# Patient Record
Sex: Female | Born: 1965 | Race: Black or African American | Hispanic: No | Marital: Married | Smoking: Former smoker
Health system: Southern US, Community
[De-identification: ages and names within clinical notes are randomized; demographics above are authoritative.]

## PROBLEM LIST (undated history)

## (undated) DIAGNOSIS — E785 Hyperlipidemia, unspecified: Secondary | ICD-10-CM

## (undated) DIAGNOSIS — Z720 Tobacco use: Secondary | ICD-10-CM

## (undated) DIAGNOSIS — E049 Nontoxic goiter, unspecified: Secondary | ICD-10-CM

## (undated) DIAGNOSIS — E559 Vitamin D deficiency, unspecified: Secondary | ICD-10-CM

## (undated) DIAGNOSIS — E669 Obesity, unspecified: Secondary | ICD-10-CM

## (undated) DIAGNOSIS — I10 Essential (primary) hypertension: Secondary | ICD-10-CM

## (undated) DIAGNOSIS — R0989 Other specified symptoms and signs involving the circulatory and respiratory systems: Secondary | ICD-10-CM

## (undated) DIAGNOSIS — K219 Gastro-esophageal reflux disease without esophagitis: Secondary | ICD-10-CM

## (undated) DIAGNOSIS — E119 Type 2 diabetes mellitus without complications: Secondary | ICD-10-CM

## (undated) DIAGNOSIS — Z973 Presence of spectacles and contact lenses: Secondary | ICD-10-CM

## (undated) DIAGNOSIS — K08109 Complete loss of teeth, unspecified cause, unspecified class: Secondary | ICD-10-CM

## (undated) HISTORY — DX: Obesity, unspecified: E66.9

## (undated) HISTORY — DX: Vitamin D deficiency, unspecified: E55.9

## (undated) HISTORY — PX: CHOLECYSTECTOMY: SHX55

## (undated) HISTORY — PX: STRABISMUS SURGERY: SHX218

## (undated) HISTORY — PX: COLPOSCOPY: SHX161

## (undated) HISTORY — DX: Gastro-esophageal reflux disease without esophagitis: K21.9

## (undated) HISTORY — DX: Presence of spectacles and contact lenses: Z97.3

## (undated) HISTORY — PX: ROTATOR CUFF REPAIR: SHX139

## (undated) HISTORY — DX: Other specified symptoms and signs involving the circulatory and respiratory systems: R09.89

## (undated) HISTORY — DX: Nontoxic goiter, unspecified: E04.9

## (undated) HISTORY — DX: Essential (primary) hypertension: I10

## (undated) HISTORY — DX: Tobacco use: Z72.0

## (undated) HISTORY — DX: Type 2 diabetes mellitus without complications: E11.9

## (undated) HISTORY — DX: Hyperlipidemia, unspecified: E78.5

---
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
## 2014-01-26 ENCOUNTER — Ambulatory Visit (INDEPENDENT_AMBULATORY_CARE_PROVIDER_SITE_OTHER): Payer: BLUE CROSS/BLUE SHIELD | Admitting: Medical

## 2014-01-26 ENCOUNTER — Encounter: Payer: Self-pay | Admitting: Medical

## 2014-01-26 VITALS — BP 128/88 | HR 72 | Temp 98.0°F | Resp 15 | Ht 62.5 in | Wt 196.0 lb

## 2014-01-26 DIAGNOSIS — Z8349 Family history of other endocrine, nutritional and metabolic diseases: Secondary | ICD-10-CM

## 2014-01-26 DIAGNOSIS — H6123 Impacted cerumen, bilateral: Secondary | ICD-10-CM

## 2014-01-26 DIAGNOSIS — E049 Nontoxic goiter, unspecified: Secondary | ICD-10-CM

## 2014-01-26 DIAGNOSIS — H0520 Unspecified exophthalmos: Secondary | ICD-10-CM

## 2014-01-26 DIAGNOSIS — I10 Essential (primary) hypertension: Secondary | ICD-10-CM

## 2014-01-26 MED ORDER — ASPIRIN EC 81 MG PO TBEC: 81.0000 mg | DELAYED_RELEASE_TABLET | Freq: Every day | ORAL | Status: DC

## 2014-01-26 MED ORDER — AMLODIPINE BESYLATE 5 MG PO TABS: 5.0000 mg | ORAL_TABLET | Freq: Every day | ORAL | Status: DC

## 2014-01-26 MED ORDER — HYDROCHLOROTHIAZIDE 25 MG PO TABS: 25.0000 mg | ORAL_TABLET | Freq: Every day | ORAL | Status: DC

## 2014-01-26 MED ORDER — POTASSIUM CHLORIDE ER 10 MEQ PO TBCR: 10.0000 meq | EXTENDED_RELEASE_TABLET | Freq: Every day | ORAL | Status: DC

## 2014-01-26 MED ORDER — ENALAPRIL MALEATE 10 MG PO TABS: 10.0000 mg | ORAL_TABLET | Freq: Every day | ORAL | Status: DC

## 2014-01-26 NOTE — Progress Notes (Signed)
   Subjective:   Ariel Sweeney is a 49 y.o. female presenting on 01/26/2014 with Hypertension  Here as a new patient today. From [place] originally but has spent the last 10 years in [place]. Recently moved back here [place] living with family since July of this past year.  In general her only complaints are need for medication refills on blood pressure medication and impacted ear wax. She does fine on her current blood pressure medications, been on those medications for years. Hearing seems muffled and in the past has had to have the earwax flushed out several times.  Last physical over a year ago. No other aggravating or relieving factors.  No other complaint.  Review of Systems ROS as in subjective      Objective:    Filed Vitals:   01/26/14 1458  BP: 128/88  Pulse: 72  Temp: 98 F (36.7 C)  Resp: 15    General appearance: alert, no distress, WD/WN, pleasant AA female +exophthalmos noted Impacted cerumen bilat Oral cavity: MMM, no lesions Neck: supple, no lymphadenopathy, generalized mild to moderate goiter noted, no masses Heart: RRR, normal S1, S2, no murmurs Lungs: CTA bilaterally, no wheezes, rhonchi, or rales Abdomen: +bs, soft, non tender, non distended, no masses, no hepatomegaly, no splenomegaly Pulses: 2+ symmetric, upper and lower extremities, normal cap refill Ext: no edema     Assessment: Encounter Diagnoses  Name Primary?  . Essential hypertension Yes  . Goiter   . Exophthalmos   . Impacted cerumen of both ears   . Family history of thyroid disease      Plan: Hypertension-continue same medication, she has been well controlled on current medication, return in 3 months for a fasting physical and labs  Goiter, exophthalmos, family history of thyroid disease in mother-we discussed the findings and diagnosis of goiter which she was unaware of. She will return in 3 months for a physical and labs and advised that she will need an ultrasound of  the thyroid as well.  Impacted cerumen of both ears - discussed findings.  Discussed risk/benefits of procedure and patient agrees to procedure. Successfully used warm water lavage to remove impacted cerumen from bilat ear canal. Patient tolerated procedure well. Advised they avoid using any cotton swabs or other devices to clean the ear canals.  Use basic hygiene as discussed.  Follow up prn.   Azarria was seen today for hypertension.  Diagnoses and associated orders for this visit:  Essential hypertension  Goiter  Exophthalmos  Impacted cerumen of both ears  Family history of thyroid disease  Other Orders - potassium chloride (K-DUR) 10 MEQ tablet; Take 1 tablet (10 mEq total) by mouth daily. - hydrochlorothiazide (HYDRODIURIL) 25 MG tablet; Take 1 tablet (25 mg total) by mouth daily. - enalapril (VASOTEC) 10 MG tablet; Take 1 tablet (10 mg total) by mouth daily. - amLODipine (NORVASC) 5 MG tablet; Take 1 tablet (5 mg total) by mouth daily. - aspirin EC 81 MG tablet; Take 1 tablet (81 mg total) by mouth daily.    Return 64mo for CPX.

## 2014-04-17 ENCOUNTER — Telehealth: Payer: Self-pay | Admitting: Medical

## 2014-04-17 ENCOUNTER — Other Ambulatory Visit (HOSPITAL_COMMUNITY)
Admission: RE | Admit: 2014-04-17 | Discharge: 2014-04-17 | Disposition: A | Discharge status: Home / Self Care | Payer: BLUE CROSS/BLUE SHIELD | Source: Ambulatory Visit | Attending: Medical | Admitting: Medical

## 2014-04-17 ENCOUNTER — Ambulatory Visit (INDEPENDENT_AMBULATORY_CARE_PROVIDER_SITE_OTHER): Payer: BLUE CROSS/BLUE SHIELD | Admitting: Medical

## 2014-04-17 ENCOUNTER — Encounter: Payer: Self-pay | Admitting: Medical

## 2014-04-17 VITALS — BP 122/88 | HR 76 | Temp 98.1°F | Ht 61.5 in | Wt 199.0 lb

## 2014-04-17 DIAGNOSIS — I10 Essential (primary) hypertension: Secondary | ICD-10-CM

## 2014-04-17 DIAGNOSIS — Z1239 Encounter for other screening for malignant neoplasm of breast: Secondary | ICD-10-CM | POA: Diagnosis not present

## 2014-04-17 DIAGNOSIS — Z7189 Other specified counseling: Secondary | ICD-10-CM | POA: Diagnosis not present

## 2014-04-17 DIAGNOSIS — E669 Obesity, unspecified: Secondary | ICD-10-CM | POA: Diagnosis not present

## 2014-04-17 DIAGNOSIS — H0520 Unspecified exophthalmos: Secondary | ICD-10-CM | POA: Diagnosis not present

## 2014-04-17 DIAGNOSIS — Z01419 Encounter for gynecological examination (general) (routine) without abnormal findings: Secondary | ICD-10-CM | POA: Insufficient documentation

## 2014-04-17 DIAGNOSIS — Z7185 Encounter for immunization safety counseling: Secondary | ICD-10-CM

## 2014-04-17 DIAGNOSIS — Z1151 Encounter for screening for human papillomavirus (HPV): Secondary | ICD-10-CM | POA: Insufficient documentation

## 2014-04-17 DIAGNOSIS — E049 Nontoxic goiter, unspecified: Secondary | ICD-10-CM | POA: Insufficient documentation

## 2014-04-17 DIAGNOSIS — Z23 Encounter for immunization: Secondary | ICD-10-CM

## 2014-04-17 DIAGNOSIS — Z124 Encounter for screening for malignant neoplasm of cervix: Secondary | ICD-10-CM

## 2014-04-17 DIAGNOSIS — Z0000 Encounter for general adult medical examination without abnormal findings: Secondary | ICD-10-CM | POA: Diagnosis not present

## 2014-04-17 LAB — POCT URINALYSIS DIPSTICK
Bilirubin, UA: NEGATIVE
Glucose, UA: NEGATIVE
Ketones, UA: NEGATIVE
Leukocytes, UA: NEGATIVE
Nitrite, UA: NEGATIVE
Spec Grav, UA: >=1.03
Urobilinogen, UA: NEGATIVE
pH, UA: 6

## 2014-04-17 LAB — CBC
HCT: 41.2 % (ref 36.0–46.0)
Hemoglobin: 13.6 g/dL (ref 12.0–15.0)
MCH: 29.4 pg (ref 26.0–34.0)
MCHC: 33 g/dL (ref 30.0–36.0)
MCV: 89 fL (ref 78.0–100.0)
MPV: 10.3 fL (ref 8.6–12.4)
Platelets: 283 10*3/uL (ref 150–400)
RBC: 4.63 MIL/uL (ref 3.87–5.11)
RDW: 13.4 % (ref 11.5–15.5)
WBC: 5.8 10*3/uL (ref 4.0–10.5)

## 2014-04-17 NOTE — Progress Notes (Addendum)
Subjective:   HPI  Ariel Sweeney Pain is a 49 y.o. female who presents for a complete physical.  Preventative care: Last ophthalmology visit:N/A Last dental visit:N/A Last colonoscopy:N/A Last mammogram:3 YEARS AGO Last gynecological exam:04/17/14 Last EKG:YES IN THE PAST Last labs:NEW Prior vaccinations: TD or Tdap:SHE FEELS LIKE ITS BEEN WITH THE PAST TEN YEARS Influenza:DECLINED Pneumococcal:N/A Shingles/Zostavax:N/A  Concerns: Gynecology history - hx/o 4 pregnancy, 1 live birth (37yo daughter), 2 miscarriage, 1 abortion.   Having periods, but been a little variable.   Last year January and February no period, then had periods until June, then skipped a few months.   So periods are starting to slow down a bit.   Mother had partial hysterectomy.  Currently not sexually active, declines STD screening.  Last std screen last year, normal, no sexual partners since.  Reviewed their medical, surgical, family, social, medication, and allergy history and updated chart as appropriate.  Past Medical History  Diagnosis Date  . Hypertension   . Obesity   . Goiter   . GERD (gastroesophageal reflux disease)     intermittent  . Wears contact lenses     Past Surgical History  Procedure Laterality Date  . Strabismus surgery      childhood  . Cholecystectomy    . Colposcopy      history of abnormal pap in remote past    History   Social History  . Marital Status: Single    Spouse Name: N/A  . Number of Children: N/A  . Years of Education: N/A   Occupational History  . Not on file.   Social History Main Topics  . Smoking status: Current Some Day Smoker -- 0.25 packs/day for 25 years  . Smokeless tobacco: Not on file  . Alcohol Use: 3.6 oz/week    2 Glasses of wine, 2 Cans of beer, 2 Shots of liquor, 0 Standard drinks or equivalent per week  . Drug Use: No  . Sexual Activity: Not on file   Other Topics Concern  . 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 Diabetes Mother   . Hypertension Mother   . Kidney disease Mother   . Anemia Mother   . Hypertension Father   . Diabetes Father   . Heart disease Father   . Hypertension Sister   . Stroke Maternal Aunt   . Cancer Neg Hx      Current outpatient prescriptions:  .  amLODipine (NORVASC) 5 MG tablet, Take 1 tablet (5 mg total) by mouth daily., Disp: 30 tablet, Rfl: 2 .  aspirin EC 81 MG tablet, Take 1 tablet (81 mg total) by mouth daily., Disp: 30 tablet, Rfl: 11 .  cholecalciferol (VITAMIN D) 1000 UNITS tablet, Take 1,000 Units by mouth daily., Disp: , Rfl:  .  cyanocobalamin 500 MCG tablet, Take 500 mcg by mouth every other day., Disp: , Rfl:  .  enalapril (VASOTEC) 10 MG tablet, Take 1 tablet (10 mg total) by mouth daily., Disp: 30 tablet, Rfl: 2 .  hydrochlorothiazide (HYDRODIURIL) 25 MG tablet, Take 1 tablet (25 mg total) by mouth daily., Disp: 30 tablet, Rfl: 2 .  potassium chloride (K-DUR) 10 MEQ tablet, Take 1 tablet (10 mEq total) by mouth daily., Disp: 30 tablet, Rfl: 2  No  Known Allergies  Review of Systems Constitutional: -fever, -chills, -sweats, -unexpected weight change, -decreased appetite, -fatigue Allergy: -sneezing, -itching, -congestion Dermatology: -changing moles, --rash, -lumps ENT: -runny nose, -ear pain, -sore throat, -hoarseness, -sinus pain, -teeth pain, - ringing in ears, -hearing loss, -nosebleeds Cardiology: -chest pain, -palpitations, -swelling, -difficulty breathing when lying flat, -waking up short of breath Respiratory: -cough, -shortness of breath, -difficulty breathing with exercise or exertion, -wheezing, -coughing up blood Gastroenterology: -abdominal pain, -nausea, -vomiting, -diarrhea, -constipation, -blood in stool, -changes in bowel movement, -difficulty swallowing or  eating Hematology: -bleeding, -bruising  Musculoskeletal: -joint aches, -muscle aches, -joint swelling, -back pain, -neck pain, -cramping, -changes in gait Ophthalmology: denies vision changes, eye redness, itching, discharge Urology: -burning with urination, -difficulty urinating, -blood in urine, -urinary frequency, -urgency, -incontinence Neurology: -headache, -weakness, -tingling, -numbness, -memory loss, -falls, -dizziness Psychology: -depressed mood, -agitation, -sleep problems     Objective:   Physical Exam  BP 122/88 mmHg  Pulse 76  Temp(Src) 98.1 F (36.7 C) (Oral)  Ht 5' 1.5" (1.562 m)  Wt 199 lb (90.266 kg)  BMI 37.00 kg/m2  General appearance: alert, no distress, WD/WN, obese AA female Skin: several keloids left and right upper shoulders posteriorly HEENT: normocephalic, conjunctiva/corneas normal, sclerae anicteric, PERRLA, EOMi, nares patent, no discharge or erythema, pharynx normal Oral cavity: MMM, tongue normal, teeth with moderate plaque Neck: supple, no lymphadenopathy, moderate goiter, no masses, normal ROM, no bruits Chest: 2 vertical linear keloids, upper middle chest, non tender, normal shape and expansion Heart: RRR, normal S1, S2, no murmurs Lungs: CTA bilaterally, no wheezes, rhonchi, or rales Abdomen: +bs, soft, few small laparoscopic surgical scars with keloid tissue upper abdomen and umbilicus, non tender, non distended, no masses, +possible mild hepatomegaly, no splenomegaly, no bruits Back: non tender, normal ROM, no scoliosis Musculoskeletal: upper extremities non tender, no obvious deformity, normal ROM throughout, lower extremities non tender, no obvious deformity, normal ROM throughout Extremities: no edema, no cyanosis, no clubbing Pulses: 2+ symmetric, upper and lower extremities, normal cap refill Neurological: alert, oriented x 3, CN2-12 intact, strength normal upper extremities and lower extremities, sensation normal throughout, DTRs 2+  throughout, no cerebellar signs, gait normal Psychiatric: normal affect, behavior normal, pleasant  Breast: nontender, no masses or lumps, no skin changes, no nipple discharge or inversion, no axillary lymphadenopathy Gyn: Normal external genitalia without lesions, small skin tag right lower vulva, vagina with normal mucosa, cervix without lesions, no cervical motion tenderness, no abnormal vaginal discharge.  Uterus and adnexa not enlarged, nontender, no masses.  Pap performed.  Exam chaperoned by nurse. Rectal: small external hemorrhoid, otherwise normal appearing    Assessment and Plan :    Encounter Diagnoses  Name Primary?  . Encounter for health maintenance examination in adult Yes  . Essential hypertension   . Goiter   . Obesity   . Exophthalmos   . Screening for breast cancer   . Screening for cervical cancer   . Need for pneumococcal vaccine   . Vaccine counseling      Physical exam - discussed healthy lifestyle, diet, exercise, preventative care, vaccinations, and addressed their concerns.  Handout given. See your eye doctor yearly for routine vision care. See your dentist yearly for routine dental care including hygiene visits twice yearly. HTN - c/t same medication Goiter - [place] and labs ordered Routine labs today obesity - work on efforts at weight loss through lifestyle changes as discussed Counseled on the pneumococcal vaccine.  Vaccine information sheet given.  Pneumococcal vaccine PPSV 23  given after consent obtained. Vaccine counseling - she will check into free Hep B vaccine at work. Declines flu vaccine.  Follow-up pending labs

## 2014-04-17 NOTE — Patient Instructions (Signed)
  Thank you for giving me the opportunity to serve you today.    Your diagnosis today includes: Encounter Diagnoses  Name Primary?  . Encounter for health maintenance examination in adult Yes  . Essential hypertension   . Goiter   . Obesity   . Exophthalmos   . Screening for breast cancer   . Screening for cervical cancer   . Need for pneumococcal vaccine   . Vaccine counseling      Specific recommendations today include:  We will set up for ultrasound of thyroid  We will call with labs See your eye doctor yearly for routine vision care. See your dentist yearly for routine dental care including hygiene visits twice yearly. I strongly recommend you get the hepatitis B vaccine series through work  Return pending labs.    I have included other useful information below for your review.   [facility] Dr. Webb Laws [place], [place], [place] [address] [phone]   [facility] Dr. Camillo Flaming [address], [place], [place] [address]  [email or web address]   Fabio Pierce, M.D. Corena Herter, O.D. [place], [place], [place] [address] Medical telephone: [phone] Optical telephone: [phone]   Dentist Dr. Jonna Coup, dentist [address] [place], [place] [address] [phone] [email or web address]

## 2014-04-17 NOTE — Telephone Encounter (Signed)
Set up for thyroid ultrasound given goiter, exopthalmos

## 2014-04-18 ENCOUNTER — Other Ambulatory Visit: Payer: Self-pay | Admitting: Medical

## 2014-04-18 LAB — LIPID PANEL
Cholesterol: 188 mg/dL (ref 0–200)
HDL: 37 mg/dL — ABNORMAL LOW (ref >=46)
LDL Cholesterol: 106 mg/dL — ABNORMAL HIGH (ref 0–99)
Total CHOL/HDL Ratio: 5.1 Ratio
Triglycerides: 224 mg/dL — ABNORMAL HIGH (ref <150)
VLDL: 45 mg/dL — ABNORMAL HIGH (ref 0–40)

## 2014-04-18 LAB — T4, FREE: Free T4: 0.88 ng/dL (ref 0.80–1.80)

## 2014-04-18 LAB — HEMOGLOBIN A1C
Hgb A1c MFr Bld: 6.5 % — ABNORMAL HIGH (ref <5.7)
Mean Plasma Glucose: 140 mg/dL — ABNORMAL HIGH (ref <117)

## 2014-04-18 LAB — TSH: TSH: 0.855 u[IU]/mL (ref 0.350–4.500)

## 2014-04-18 LAB — CYTOLOGY - PAP

## 2014-04-18 MED ORDER — POTASSIUM CHLORIDE ER 10 MEQ PO TBCR: 10.0000 meq | EXTENDED_RELEASE_TABLET | Freq: Every day | ORAL | Status: DC

## 2014-04-18 MED ORDER — HYDROCHLOROTHIAZIDE 25 MG PO TABS: 25.0000 mg | ORAL_TABLET | Freq: Every day | ORAL | Status: DC

## 2014-04-18 MED ORDER — AMLODIPINE BESYLATE 5 MG PO TABS: 5.0000 mg | ORAL_TABLET | Freq: Every day | ORAL | Status: DC

## 2014-04-18 MED ORDER — ENALAPRIL MALEATE 10 MG PO TABS: 10.0000 mg | ORAL_TABLET | Freq: Every day | ORAL | Status: DC

## 2014-04-18 NOTE — Telephone Encounter (Signed)
Patient is aware of her appointment to have her ultrasound on 04/21/14 @ 140 pm [place].,

## 2014-04-21 ENCOUNTER — Ambulatory Visit
Admission: RE | Admit: 2014-04-21 | Discharge: 2014-04-21 | Disposition: A | Discharge status: Home / Self Care | Payer: BLUE CROSS/BLUE SHIELD | Source: Ambulatory Visit | Attending: Medical | Admitting: Medical

## 2014-04-21 DIAGNOSIS — E049 Nontoxic goiter, unspecified: Secondary | ICD-10-CM

## 2014-04-21 DIAGNOSIS — H0520 Unspecified exophthalmos: Secondary | ICD-10-CM

## 2014-05-02 ENCOUNTER — Encounter: Payer: Self-pay | Admitting: Medical

## 2014-05-02 ENCOUNTER — Ambulatory Visit (INDEPENDENT_AMBULATORY_CARE_PROVIDER_SITE_OTHER): Payer: BLUE CROSS/BLUE SHIELD | Admitting: Medical

## 2014-05-02 VITALS — BP 120/82 | HR 70 | Resp 14 | Wt 192.0 lb

## 2014-05-02 DIAGNOSIS — I10 Essential (primary) hypertension: Secondary | ICD-10-CM

## 2014-05-02 DIAGNOSIS — R946 Abnormal results of thyroid function studies: Secondary | ICD-10-CM | POA: Diagnosis not present

## 2014-05-02 DIAGNOSIS — E669 Obesity, unspecified: Secondary | ICD-10-CM

## 2014-05-02 DIAGNOSIS — R7989 Other specified abnormal findings of blood chemistry: Secondary | ICD-10-CM

## 2014-05-02 DIAGNOSIS — E041 Nontoxic single thyroid nodule: Secondary | ICD-10-CM

## 2014-05-02 DIAGNOSIS — E782 Mixed hyperlipidemia: Secondary | ICD-10-CM | POA: Diagnosis not present

## 2014-05-02 DIAGNOSIS — R7301 Impaired fasting glucose: Secondary | ICD-10-CM

## 2014-05-02 NOTE — Progress Notes (Signed)
Subjective Here for f/u on recent abnormal labs and ultrasound of thyroid.   She reports not much exercise, and eating worse here then when she lived up in [place] prior.  Eating much more fried foods and fast food now.   Drinks soda at least once daily.  Eye doctor in the past has not worried about thyroid issues with her.  No other aggravating or relieving factors. No other complaint.  Past Medical History  Diagnosis Date  . Hypertension   . Obesity   . Goiter   . GERD (gastroesophageal reflux disease)     intermittent  . Wears contact lenses    ROS as in subjective  Objective: BP 120/82 mmHg  Pulse 70  Resp 14  Wt 192 lb (87.091 kg)  Gen: wd, wn, nad Psych: pleasant, good eye contact otherwise not examined  Assessment: Encounter Diagnoses  Name Primary?  . Impaired fasting blood sugar Yes  . Mixed dyslipidemia   . Obesity   . Essential hypertension   . Thyroid cyst   . Abnormal thyroid blood test    Plan:  We discussed her recent findings on labs and thyroid US including HgbA 6.5%, elevated TRIG, low HDL, cholesterol ration higher than average, low to normal TSH and Free T4, thyroid cysts on ultrasound, exopthalamus and goiter.   She is compliant with BP medication.   The major take away today was the need for lifestyle changes.  counseled extensively on diabetic diet, exercise, water intake, cutting out soda, cutting down on serving sizes, and making significant changes to lose weight as well as improving glucose and dyslipidemia.   Will recheck 4-56mo, sooner if needed.

## 2014-06-08 ENCOUNTER — Encounter: Payer: Self-pay | Admitting: Medical

## 2014-06-08 ENCOUNTER — Ambulatory Visit (INDEPENDENT_AMBULATORY_CARE_PROVIDER_SITE_OTHER): Payer: BLUE CROSS/BLUE SHIELD | Admitting: Medical

## 2014-06-08 VITALS — BP 132/78 | HR 82 | Temp 98.2°F | Resp 15 | Wt 189.0 lb

## 2014-06-08 DIAGNOSIS — H9193 Unspecified hearing loss, bilateral: Secondary | ICD-10-CM | POA: Diagnosis not present

## 2014-06-08 DIAGNOSIS — H6123 Impacted cerumen, bilateral: Secondary | ICD-10-CM

## 2014-06-08 NOTE — Progress Notes (Signed)
subjective Here for hearing decreased, thinks her ear wax is impacted.  Has had long history of this.   She denies URI symptoms, no other c/o.     Objective: BP 132/78 mmHg  Pulse 82  Temp(Src) 98.2 F (36.8 C) (Oral)  Resp 15  Wt 189 lb (85.73 kg)   Gen: wd, wn nad bilat ear canals with impacted cerumen otherwise HENT unremarkable   Assessment: Encounter Diagnoses  Name Primary?  . Hearing decreased, bilateral Yes  . Impacted cerumen of both ears    Plan: Discussed findings.  Discussed risk/benefits of procedure and patient agrees to procedure. Successfully used warm water lavage to remove impacted cerumen from bilat ear canal. Patient tolerated procedure well. Advised they avoid using any cotton swabs or other devices to clean the ear canals.  Use basic hygiene as discussed.  Follow up prn.

## 2015-01-14 HISTORY — PX: BREAST CYST ASPIRATION: SHX578

## 2015-04-28 ENCOUNTER — Other Ambulatory Visit: Payer: Self-pay | Admitting: Medical

## 2015-05-01 ENCOUNTER — Other Ambulatory Visit: Payer: Self-pay | Admitting: Family Medicine

## 2015-05-01 ENCOUNTER — Telehealth: Payer: Self-pay | Admitting: Family Medicine

## 2015-05-01 MED ORDER — AMLODIPINE BESYLATE 5 MG PO TABS: 5.0000 mg | ORAL_TABLET | Freq: Every day | ORAL | Status: DC

## 2015-05-01 NOTE — Telephone Encounter (Signed)
Pt called for appt and refills.  Refills were given yesterday and pt made cpe for May

## 2015-05-22 ENCOUNTER — Encounter: Payer: BLUE CROSS/BLUE SHIELD | Admitting: Medical

## 2015-06-06 ENCOUNTER — Encounter: Payer: Self-pay | Admitting: Medical

## 2015-06-06 ENCOUNTER — Ambulatory Visit (INDEPENDENT_AMBULATORY_CARE_PROVIDER_SITE_OTHER): Payer: BLUE CROSS/BLUE SHIELD | Admitting: Medical

## 2015-06-06 VITALS — BP 118/80 | HR 72 | Ht 61.5 in | Wt 184.0 lb

## 2015-06-06 DIAGNOSIS — M25511 Pain in right shoulder: Secondary | ICD-10-CM | POA: Diagnosis not present

## 2015-06-06 DIAGNOSIS — Z0000 Encounter for general adult medical examination without abnormal findings: Secondary | ICD-10-CM | POA: Insufficient documentation

## 2015-06-06 DIAGNOSIS — Z23 Encounter for immunization: Secondary | ICD-10-CM

## 2015-06-06 DIAGNOSIS — Z1211 Encounter for screening for malignant neoplasm of colon: Secondary | ICD-10-CM | POA: Diagnosis not present

## 2015-06-06 DIAGNOSIS — Z1239 Encounter for other screening for malignant neoplasm of breast: Secondary | ICD-10-CM | POA: Insufficient documentation

## 2015-06-06 DIAGNOSIS — E669 Obesity, unspecified: Secondary | ICD-10-CM | POA: Diagnosis not present

## 2015-06-06 DIAGNOSIS — Z1231 Encounter for screening mammogram for malignant neoplasm of breast: Secondary | ICD-10-CM

## 2015-06-06 DIAGNOSIS — E049 Nontoxic goiter, unspecified: Secondary | ICD-10-CM | POA: Diagnosis not present

## 2015-06-06 DIAGNOSIS — F17200 Nicotine dependence, unspecified, uncomplicated: Secondary | ICD-10-CM | POA: Insufficient documentation

## 2015-06-06 DIAGNOSIS — H6123 Impacted cerumen, bilateral: Secondary | ICD-10-CM | POA: Diagnosis not present

## 2015-06-06 DIAGNOSIS — R7301 Impaired fasting glucose: Secondary | ICD-10-CM | POA: Insufficient documentation

## 2015-06-06 DIAGNOSIS — H0520 Unspecified exophthalmos: Secondary | ICD-10-CM

## 2015-06-06 DIAGNOSIS — I10 Essential (primary) hypertension: Secondary | ICD-10-CM | POA: Diagnosis not present

## 2015-06-06 LAB — LIPID PANEL
Cholesterol: 179 mg/dL (ref 125–200)
HDL: 46 mg/dL (ref >=46)
LDL Cholesterol: 90 mg/dL (ref <130)
Total CHOL/HDL Ratio: 3.9 Ratio (ref <=5.0)
Triglycerides: 214 mg/dL — ABNORMAL HIGH (ref <150)
VLDL: 43 mg/dL — ABNORMAL HIGH (ref <30)

## 2015-06-06 LAB — HEMOGLOBIN A1C
Hgb A1c MFr Bld: 5.8 % — ABNORMAL HIGH (ref <5.7)
Mean Plasma Glucose: 120 mg/dL

## 2015-06-06 LAB — CBC
HCT: 41.1 % (ref 35.0–45.0)
Hemoglobin: 13.8 g/dL (ref 11.7–15.5)
MCH: 29.6 pg (ref 27.0–33.0)
MCHC: 33.6 g/dL (ref 32.0–36.0)
MCV: 88.2 fL (ref 80.0–100.0)
MPV: 9.8 fL (ref 7.5–12.5)
Platelets: 248 10*3/uL (ref 140–400)
RBC: 4.66 MIL/uL (ref 3.80–5.10)
RDW: 13.6 % (ref 11.0–15.0)
WBC: 5.4 10*3/uL (ref 4.0–10.5)

## 2015-06-06 LAB — COMPREHENSIVE METABOLIC PANEL
ALT: 25 U/L (ref 6–29)
AST: 24 U/L (ref 10–35)
Albumin: 4.1 g/dL (ref 3.6–5.1)
Alkaline Phosphatase: 49 U/L (ref 33–115)
BUN: 16 mg/dL (ref 7–25)
CO2: 24 mmol/L (ref 20–31)
Calcium: 9.3 mg/dL (ref 8.6–10.2)
Chloride: 102 mmol/L (ref 98–110)
Creat: 0.93 mg/dL (ref 0.50–1.10)
Glucose, Bld: 98 mg/dL (ref 65–99)
Potassium: 3.6 mmol/L (ref 3.5–5.3)
Sodium: 138 mmol/L (ref 135–146)
Total Bilirubin: 0.4 mg/dL (ref 0.2–1.2)
Total Protein: 7.2 g/dL (ref 6.1–8.1)

## 2015-06-06 LAB — POCT URINALYSIS DIPSTICK
Bilirubin, UA: NEGATIVE
Glucose, UA: NEGATIVE
Ketones, UA: NEGATIVE
Leukocytes, UA: NEGATIVE
Nitrite, UA: NEGATIVE
Protein, UA: NEGATIVE
Spec Grav, UA: >=1.03
Urobilinogen, UA: NEGATIVE
pH, UA: 6

## 2015-06-06 LAB — T4, FREE: Free T4: 1.3 ng/dL (ref 0.8–1.8)

## 2015-06-06 LAB — TSH: TSH: 0.98 mIU/L

## 2015-06-06 NOTE — Addendum Note (Signed)
Addended by: Billie Lade on: 06/06/2015 09:29 AM   Modules accepted: Orders, SmartSet

## 2015-06-06 NOTE — Progress Notes (Signed)
Subjective:   HPI  Ariel Sweeney Pain is a 50 y.o. female who presents for a complete physical.  Concerns: Right shoulder pain on and off.  Works at [employer], does CNA work.  Denies specific injury or fall, but has ongoing pain, worse at night, does wake her out of sleep at times . She is left handed.  Denies neck pain, no paresthesias  Gynecology history - hx/o 4 pregnancy, 1 live birth (31yo daughter), 2 miscarriage, 1 abortion.   Having periods, but been a little variable.  Now having periods about every 3 months, slowing down from last year.   Mother had partial hysterectomy.  Currently not sexually active, declines STD screening.    Reviewed their medical, surgical, family, social, medication, and allergy history and updated chart as appropriate.  Past Medical History  Diagnosis Date  . Hypertension   . Obesity   . Goiter   . GERD (gastroesophageal reflux disease)     intermittent  . Wears contact lenses   . Tobacco use     Past Surgical History  Procedure Laterality Date  . Strabismus surgery      childhood  . Cholecystectomy    . Colposcopy      history of abnormal pap in remote past    Social History   Social History  . Marital Status: Single    Spouse Name: N/A  . Number of Children: N/A  . Years of Education: N/A   Occupational History  . Not on file.   Social History Main Topics  . Smoking status: Current Some Day Smoker -- 0.25 packs/day for 26 years  . Smokeless tobacco: Not on file  . Alcohol Use: 1.8 oz/week    0 Standard drinks or equivalent, 1 Glasses of wine, 1 Cans of beer, 1 Shots of liquor per week  . Drug Use: No  . Sexual Activity: Not on file   Other Topics Concern  . Not on file   Social History Narrative   Lives alone.   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 Diabetes Mother   . Hypertension Mother   . Kidney disease Mother   .  Anemia Mother   . Hypertension Father   . Diabetes Father   . Heart disease Father   . Hypertension Sister   . Stroke Maternal Aunt   . Cancer Neg Hx      Current outpatient prescriptions:  .  amLODipine (NORVASC) 5 MG tablet, Take 1 tablet (5 mg total) by mouth daily., Disp: 30 tablet, Rfl: 0 .  aspirin EC 81 MG tablet, Take 1 tablet (81 mg total) by mouth daily., Disp: 30 tablet, Rfl: 11 .  cholecalciferol (VITAMIN D) 1000 UNITS tablet, Take 1,000 Units by mouth daily., Disp: , Rfl:  .  cyanocobalamin 500 MCG tablet, Take 500 mcg by mouth every other day., Disp: , Rfl:  .  enalapril (VASOTEC) 10 MG tablet, TAKE ONE TABLET BY MOUTH ONCE DAILY, Disp: 90 tablet, Rfl: 0 .  hydrochlorothiazide (HYDRODIURIL) 25 MG tablet, TAKE ONE TABLET BY MOUTH DAILY, Disp: 90 tablet, Rfl: 0 .  potassium chloride (K-DUR) 10 MEQ tablet, TAKE ONE TABLET BY MOUTH DAILY, Disp: 90 tablet, Rfl: 0  No Known Allergies  Review of Systems Constitutional: -fever, -chills, -sweats, -unexpected weight change, -decreased appetite, -fatigue Allergy: -sneezing, -itching, -congestion Dermatology: -changing moles, --rash, -lumps ENT: -runny  nose, -ear pain, -sore throat, -hoarseness, -sinus pain, -teeth pain, - ringing in ears, -hearing loss, -nosebleeds Cardiology: -chest pain, -palpitations, -swelling, -difficulty breathing when lying flat, -waking up short of breath Respiratory: -cough, -shortness of breath, -difficulty breathing with exercise or exertion, -wheezing, -coughing up blood Gastroenterology: -abdominal pain, -nausea, -vomiting, -diarrhea, -constipation, -blood in stool, -changes in bowel movement, -difficulty swallowing or eating Hematology: -bleeding, -bruising  Musculoskeletal: -joint aches, -muscle aches, -joint swelling, -back pain, -neck pain, -cramping, -changes in gait Ophthalmology: denies vision changes, eye redness, itching, discharge Urology: -burning with urination, -difficulty urinating,  -blood in urine, -urinary frequency, -urgency, -incontinence Neurology: -headache, -weakness, -tingling, -numbness, -memory loss, -falls, -dizziness Psychology: -depressed mood, -agitation, -sleep problems     Objective:   Physical Exam  BP 118/80 mmHg  Pulse 72  Ht 5' 1.5" (1.562 m)  Wt 184 lb (83.462 kg)  BMI 34.21 kg/m2  General appearance: alert, no distress, WD/WN, obese AA female Skin: several keloids left and right upper shoulders posteriorly HEENT: normocephalic, conjunctiva/corneas normal, sclerae anicteric, PERRLA, EOMi, nares patent, no discharge or erythema, pharynx normal Oral cavity: MMM, tongue normal, teeth with moderate plaque Neck: supple, no lymphadenopathy, moderate goiter, no masses, normal ROM, no bruits Chest: 2 vertical linear keloids, upper middle chest, non tender, normal shape and expansion Heart: RRR, normal S1, S2, no murmurs Lungs: CTA bilaterally, no wheezes, rhonchi, or rales Abdomen: +bs, soft, few small laparoscopic surgical scars with keloid tissue upper abdomen and umbilicus, non tender, non distended, no masses, +possible mild hepatomegaly, no splenomegaly, no bruits Back: non tender, normal ROM, no scoliosis Musculoskeletal: right shoulder nontneder, normal ROM without much pain, no swelling or deformity, upper extremities non tender, no obvious deformity, normal ROM throughout, lower extremities non tender, no obvious deformity, normal ROM throughout Extremities: no edema, no cyanosis, no clubbing Pulses: 2+ symmetric, upper and lower extremities, normal cap refill Neurological: alert, oriented x 3, CN2-12 intact, strength normal upper extremities and lower extremities, sensation normal throughout, DTRs 2+ throughout, no cerebellar signs, gait normal Psychiatric: normal affect, behavior normal, pleasant  Breast: nontender, no masses or lumps, no skin changes, no nipple discharge or inversion, no axillary lymphadenopathy Gyn: Normal external  genitalia without lesions, small skin tag right lower vulva, vagina with normal mucosa, cervix without lesions, no cervical motion tenderness, no abnormal vaginal discharge.  Uterus and adnexa not enlarged, nontender, no masses.  Exam chaperoned by nurse. Rectal: small external hemorrhoid, otherwise normal appearing   Adult ECG Report  Indication: HTN, physical  Rate: 61 bpm  Rhythm: normal sinus rhythm  QRS Axis: 56 degrees  PR Interval: 179ms  QRS Duration: 67ms  QTc: 469ms  Conduction Disturbances: none  Other Abnormalities: none  Patient's cardiac risk factors are: hypertension, obesity (BMI >= 30 kg/m2) and smoking/ tobacco exposure.  EKG comparison: none  Narrative Interpretation: normal EKG    Assessment and Plan :    Encounter Diagnoses  Name Primary?  . Encounter for health maintenance examination in adult Yes  . Essential hypertension   . Exophthalmos   . Goiter   . Obesity   . Tobacco use disorder   . Impaired fasting blood sugar   . Need for Tdap vaccination   . Special screening for malignant neoplasms, colon   . Screening for breast cancer   . Right shoulder pain   . Impacted cerumen of both ears      Physical exam - discussed healthy lifestyle, diet, exercise, preventative care, vaccinations, and addressed their concerns.  See your eye doctor yearly for routine vision care. See your dentist yearly for routine dental care including hygiene visits twice yearly. HTN - c/t same medication, but change to enalapril HCT to reduce # of pills.  EKG reviewed. Goiter, exophthalmos - labs ordered Tobacco use - advised cessation. She is not ready to quit Impaired glucose - labs today Referral for colonoscopy after birth date in August She will schedule a mammogram Impacted cerumen - return for lavage at her convenience Shoulder pain - gave home therapy recommendation to use, consider xray. Routine labs today obesity - work on efforts at weight loss through lifestyle  changes as discussed Counseled on the Tdap (tetanus, diptheria, and acellular pertussis) vaccine.  Vaccine information sheet given. Tdap vaccine given after consent obtained. Declines flu vaccine.  Follow-up pending labs

## 2015-06-07 ENCOUNTER — Other Ambulatory Visit: Payer: Self-pay | Admitting: Medical

## 2015-06-07 ENCOUNTER — Telehealth: Payer: Self-pay | Admitting: Medical

## 2015-06-07 DIAGNOSIS — Z129 Encounter for screening for malignant neoplasm, site unspecified: Secondary | ICD-10-CM

## 2015-06-07 LAB — MICROALBUMIN / CREATININE URINE RATIO
Creatinine, Urine: 127 mg/dL (ref 20–320)
Microalb Creat Ratio: 58 mcg/mg creat — ABNORMAL HIGH (ref <30)
Microalb, Ur: 7.4 mg/dL

## 2015-06-07 LAB — VITAMIN D 25 HYDROXY (VIT D DEFICIENCY, FRACTURES): Vit D, 25-Hydroxy: 29 ng/mL — ABNORMAL LOW (ref 30–100)

## 2015-06-07 MED ORDER — POTASSIUM CHLORIDE ER 10 MEQ PO TBCR: 10.0000 meq | EXTENDED_RELEASE_TABLET | Freq: Every day | ORAL | Status: DC

## 2015-06-07 MED ORDER — AMLODIPINE BESYLATE 5 MG PO TABS: 5.0000 mg | ORAL_TABLET | Freq: Every day | ORAL | Status: DC

## 2015-06-07 MED ORDER — VITAMIN D (ERGOCALCIFEROL) 1.25 MG (50000 UNIT) PO CAPS: 50000.0000 [IU] | ORAL_CAPSULE | ORAL | Status: DC

## 2015-06-07 MED ORDER — ASPIRIN EC 81 MG PO TBEC: 81.0000 mg | DELAYED_RELEASE_TABLET | Freq: Every day | ORAL | Status: DC

## 2015-06-07 MED ORDER — ENALAPRIL-HYDROCHLOROTHIAZIDE 10-25 MG PO TABS: 1.0000 | ORAL_TABLET | Freq: Every day | ORAL | Status: DC

## 2015-06-07 NOTE — Telephone Encounter (Signed)
Does she want the shoulder xray?

## 2015-06-07 NOTE — Telephone Encounter (Signed)
Ariel Sweeney- I spoke with this pt and read all recommendations to pt. She reports that she would like to try diet prior to starting the Pravachol. Pt verbalized understanding about all recommendations. She will schedule mammo. If there is anything I need to add to this let me know. Thanks, RLB

## 2015-06-07 NOTE — Telephone Encounter (Signed)
Labs shows low vit D, rest of labs ok.     Recommendations: 1- I changed her enalapril and hydrochlorothiazide to one pill daily instead of the 2 separate medications 2- begin prescription Vit D weekly instead of OTC Vit D 3-c/t rest of medication as usual 4-I would recommend a daily low dose cholesterol medication to help reduce heart disease risk.  If agreeable, I'll send Pravachol and will need fasting recheck in 57mo 5-STOP SMOKING! 6-refer for GI for colonoscopy after her birth day 7-if she wants have her go for right shoulder xray 8-have her schedule mammogram 9-return if desired for cerumen lavage (ear wax)

## 2015-06-07 NOTE — Telephone Encounter (Signed)
See Rebeccas msg regarding medication. Referral for colonoscopy put in system but being held until august.

## 2015-06-07 NOTE — Telephone Encounter (Signed)
LMTCB

## 2015-06-08 NOTE — Telephone Encounter (Signed)
Pt said no not right now

## 2015-07-18 ENCOUNTER — Encounter: Payer: Self-pay | Admitting: Gastroenterology

## 2015-09-14 HISTORY — PX: COLONOSCOPY: SHX174

## 2015-09-18 ENCOUNTER — Ambulatory Visit (AMBULATORY_SURGERY_CENTER): Payer: Self-pay

## 2015-09-18 VITALS — Ht 62.0 in | Wt 187.8 lb

## 2015-09-18 DIAGNOSIS — Z1211 Encounter for screening for malignant neoplasm of colon: Secondary | ICD-10-CM

## 2015-09-18 MED ORDER — SUPREP BOWEL PREP KIT 17.5-3.13-1.6 GM/177ML PO SOLN
1.0000 | Freq: Once | ORAL | 0 refills | Status: AC
Start: 2015-09-18 — End: 2015-09-18

## 2015-09-18 NOTE — Progress Notes (Signed)
No allergies to eggs or soy No past problems with anesthesia No diet meds No home oxygen  Declined emmi 

## 2015-09-21 ENCOUNTER — Encounter: Payer: Self-pay | Admitting: Gastroenterology

## 2015-09-27 ENCOUNTER — Telehealth: Payer: Self-pay | Admitting: Gastroenterology

## 2015-09-28 NOTE — Telephone Encounter (Signed)
Suprep sample put up front for patient to pick up

## 2015-10-01 ENCOUNTER — Ambulatory Visit (AMBULATORY_SURGERY_CENTER): Payer: BLUE CROSS/BLUE SHIELD | Admitting: Gastroenterology

## 2015-10-01 ENCOUNTER — Encounter: Payer: Self-pay | Admitting: Gastroenterology

## 2015-10-01 VITALS — BP 125/90 | HR 58 | Temp 97.5°F | Resp 12 | Ht 62.0 in | Wt 187.0 lb

## 2015-10-01 DIAGNOSIS — D125 Benign neoplasm of sigmoid colon: Secondary | ICD-10-CM

## 2015-10-01 DIAGNOSIS — Z1211 Encounter for screening for malignant neoplasm of colon: Secondary | ICD-10-CM

## 2015-10-01 MED ORDER — SODIUM CHLORIDE 0.9 % IV SOLN: 500.0000 mL | INTRAVENOUS | Status: DC

## 2015-10-01 NOTE — Progress Notes (Signed)
As pt awakened, left eye was teary and red. Pt rubbed a couple of times with the blanket. Asked pt not to rub eye as not to cause any further irritation. Cool compress given to hold over eye and encouraged pt to keep eye closed while in recovery.

## 2015-10-01 NOTE — Op Note (Addendum)
[place] Patient Name: Ariel Sweeney Procedure Date: 10/01/2015 10:44 AM MRN: [id number] Endoscopist: Mauri Pole , MD Age: 50 Referring MD:  Date of Birth: 1965-05-17 Gender: Female Account #: [id number] Procedure:                Colonoscopy Indications:              Screening for colorectal malignant neoplasm, This                            is the patient's first colonoscopy Medicines:                Monitored Anesthesia Care Procedure:                Pre-Anesthesia Assessment:                           - Prior to the procedure, a History and Physical                            was performed, and patient medications and                            allergies were reviewed. The patient's tolerance of                            previous anesthesia was also reviewed. The risks                            and benefits of the procedure and the sedation                            options and risks were discussed with the patient.                            All questions were answered, and informed consent                            was obtained. Prior Anticoagulants: The patient has                            taken no previous anticoagulant or antiplatelet                            agents. ASA Grade Assessment: II - A patient with                            mild systemic disease. After reviewing the risks                            and benefits, the patient was deemed in                            satisfactory condition to undergo the procedure.  After obtaining informed consent, the colonoscope                            was passed under direct vision. Throughout the                            procedure, the patient's blood pressure, pulse, and                            oxygen saturations were monitored continuously. The                            Model CF-HQ190L [id number]) scope was introduced                            through the anus and  advanced to the the cecum,                            identified by appendiceal orifice and ileocecal                            valve. The colonoscopy was performed without                            difficulty. The patient tolerated the procedure                            well. The quality of the bowel preparation was                            good. The ileocecal valve, appendiceal orifice, and                            rectum were photographed. Scope In: 11:04:03 AM Scope Out: 11:21:39 AM Scope Withdrawal Time: 0 hours 9 minutes 59 seconds  Total Procedure Duration: 0 hours 17 minutes 36 seconds  Findings:                 The perianal and digital rectal examinations were                            normal.                           A 16 mm polyp was found in the sigmoid colon. The                            polyp was semi-pedunculated. The polyp was removed                            with a hot snare. Resection and retrieval were                            complete.  Non-bleeding internal hemorrhoids were found during                            retroflexion. The hemorrhoids were small.                           Multiple small and large-mouthed diverticula were                            found in the sigmoid colon. Complications:            No immediate complications. Estimated Blood Loss:     Estimated blood loss: none. Impression:               - One 16 mm polyp in the sigmoid colon, removed                            with a hot snare. Resected and retrieved.                           - Non-bleeding internal hemorrhoids.                           - Diverticulosis in the sigmoid colon. Recommendation:           - Patient has a contact number available for                            emergencies. The signs and symptoms of potential                            delayed complications were discussed with the                            patient. Return to normal  activities tomorrow.                            Written discharge instructions were provided to the                            patient.                           - Resume previous diet.                           - Continue present medications.                           - Await pathology results.                           - Repeat colonoscopy in 3 years for surveillance                            based on pathology results.                           -  Return to GI clinic PRN. Mauri Pole, MD 10/01/2015 11:25:52 AM This report has been signed electronically.

## 2015-10-01 NOTE — Progress Notes (Signed)
Report given to PACU RN, vss 

## 2015-10-01 NOTE — Patient Instructions (Signed)
Impression/recommendations:  Polyp (handout given) Diverticulosis (handout given) High Fiber Diet (handout given) Hemorrhoids (handout given)  Repeat colonoscopy in 3 years pending pathology results.  YOU HAD AN ENDOSCOPIC PROCEDURE TODAY AT [facility]:   Refer to the procedure report that was given to you for any specific questions about what was found during the examination.  If the procedure report does not answer your questions, please call your gastroenterologist to clarify.  If you requested that your care partner not be given the details of your procedure findings, then the procedure report has been included in a sealed envelope for you to review at your convenience later.  YOU SHOULD EXPECT: Some feelings of bloating in the abdomen. Passage of more gas than usual.  Walking can help get rid of the air that was put into your GI tract during the procedure and reduce the bloating. If you had a lower endoscopy (such as a colonoscopy or flexible sigmoidoscopy) you may notice spotting of blood in your stool or on the toilet paper. If you underwent a bowel prep for your procedure, you may not have a normal bowel movement for a few days.  Please Note:  You might notice some irritation and congestion in your nose or some drainage.  This is from the oxygen used during your procedure.  There is no need for concern and it should clear up in a day or so.  SYMPTOMS TO REPORT IMMEDIATELY:   Following lower endoscopy (colonoscopy or flexible sigmoidoscopy):  Excessive amounts of blood in the stool  Significant tenderness or worsening of abdominal pains  Swelling of the abdomen that is new, acute  Fever of 100F or higher   For urgent or emergent issues, a gastroenterologist can be reached at any hour by calling [phone].   DIET:  We do recommend a small meal at first, but then you may proceed to your regular diet.  Drink plenty of fluids but you should avoid alcoholic  beverages for 24 hours.  ACTIVITY:  You should plan to take it easy for the rest of today and you should NOT DRIVE or use heavy machinery until tomorrow (because of the sedation medicines used during the test).    FOLLOW UP: Our staff will call the number listed on your records the next business day following your procedure to check on you and address any questions or concerns that you may have regarding the information given to you following your procedure. If we do not reach you, we will leave a message.  However, if you are feeling well and you are not experiencing any problems, there is no need to return our call.  We will assume that you have returned to your regular daily activities without incident.  If any biopsies were taken you will be contacted by phone or by letter within the next 1-3 weeks.  Please call us at [phone] if you have not heard about the biopsies in 3 weeks.    SIGNATURES/CONFIDENTIALITY: You and/or your care partner have signed paperwork which will be entered into your electronic medical record.  These signatures attest to the fact that that the information above on your After Visit Summary has been reviewed and is understood.  Full responsibility of the confidentiality of this discharge information lies with you and/or your care-partner.

## 2015-10-02 ENCOUNTER — Telehealth: Payer: Self-pay | Admitting: *Deleted

## 2015-10-02 NOTE — Telephone Encounter (Signed)
  Follow up Call-  Call back number 10/01/2015  Post procedure Call Back phone  # [phone]  Permission to leave phone message Yes     Patient questions:  Do you have a fever, pain , or abdominal swelling? No. Pain Score  0 *  Have you tolerated food without any problems? Yes.    Have you been able to return to your normal activities? Yes.    Do you have any questions about your discharge instructions: Diet   No. Medications  No. Follow up visit  No.  Do you have questions or concerns about your Care? No.  Actions: * If pain score is 4 or above: No action needed, pain <4.

## 2015-10-07 ENCOUNTER — Encounter: Payer: Self-pay | Admitting: Gastroenterology

## 2016-06-16 ENCOUNTER — Other Ambulatory Visit: Payer: Self-pay | Admitting: Medical

## 2016-06-23 ENCOUNTER — Telehealth: Payer: Self-pay | Admitting: Medical

## 2016-06-23 ENCOUNTER — Ambulatory Visit (INDEPENDENT_AMBULATORY_CARE_PROVIDER_SITE_OTHER): Payer: BLUE CROSS/BLUE SHIELD | Admitting: Medical

## 2016-06-23 ENCOUNTER — Encounter: Payer: Self-pay | Admitting: Medical

## 2016-06-23 VITALS — BP 130/78 | HR 65 | Wt 194.8 lb

## 2016-06-23 DIAGNOSIS — E669 Obesity, unspecified: Secondary | ICD-10-CM

## 2016-06-23 DIAGNOSIS — I10 Essential (primary) hypertension: Secondary | ICD-10-CM

## 2016-06-23 DIAGNOSIS — E049 Nontoxic goiter, unspecified: Secondary | ICD-10-CM | POA: Diagnosis not present

## 2016-06-23 DIAGNOSIS — M79651 Pain in right thigh: Secondary | ICD-10-CM

## 2016-06-23 DIAGNOSIS — M25511 Pain in right shoulder: Secondary | ICD-10-CM | POA: Diagnosis not present

## 2016-06-23 DIAGNOSIS — R0989 Other specified symptoms and signs involving the circulatory and respiratory systems: Secondary | ICD-10-CM

## 2016-06-23 DIAGNOSIS — H0520 Unspecified exophthalmos: Secondary | ICD-10-CM

## 2016-06-23 DIAGNOSIS — G8929 Other chronic pain: Secondary | ICD-10-CM

## 2016-06-23 DIAGNOSIS — M79652 Pain in left thigh: Secondary | ICD-10-CM | POA: Diagnosis not present

## 2016-06-23 DIAGNOSIS — M549 Dorsalgia, unspecified: Secondary | ICD-10-CM

## 2016-06-23 DIAGNOSIS — IMO0001 Reserved for inherently not codable concepts without codable children: Secondary | ICD-10-CM

## 2016-06-23 DIAGNOSIS — F17200 Nicotine dependence, unspecified, uncomplicated: Secondary | ICD-10-CM

## 2016-06-23 DIAGNOSIS — R7301 Impaired fasting glucose: Secondary | ICD-10-CM

## 2016-06-23 MED ORDER — CYCLOBENZAPRINE HCL 10 MG PO TABS
ORAL_TABLET | ORAL | 0 refills | Status: DC
Start: 2016-06-23 — End: 2016-07-09

## 2016-06-23 NOTE — Telephone Encounter (Signed)
Refer for ABIs

## 2016-06-23 NOTE — Progress Notes (Signed)
Subjective: Chief Complaint  Patient presents with  . med check    med check.rt shoulder pain x1 year., cramping in legs   Here for med check . Last visit over a year ago.   Needs refill on medications.  She reports being compliant with medications.  Taking Amlodipine and Enalapril HCT for HTN  She is taking Aspirin daily, potassium daily, Vit D daily  She notes ongoing problems with right upper back, right shoulder.  She is left handed.   We had discussed shoulder pain last year.  She did get some improvements, but having more problems of late, particularly night time pain on a regular basis.    Also having upper leg cramps and knots in her legs.    This past year she had colonoscopy.  Due again in 5 years.  Past Medical History:  Diagnosis Date  . GERD (gastroesophageal reflux disease)    intermittent  . Goiter   . Hypertension   . Obesity   . Tobacco use   . Wears contact lenses    Current Outpatient Prescriptions on File Prior to Visit  Medication Sig Dispense Refill  . amLODipine (NORVASC) 5 MG tablet TAKE ONE TABLET BY MOUTH DAILY 30 tablet 0  . aspirin EC 81 MG tablet Take 1 tablet (81 mg total) by mouth daily. 90 tablet 3  . cyanocobalamin 500 MCG tablet Take 500 mcg by mouth every other day.    . enalapril-hydrochlorothiazide (VASERETIC) 10-25 MG tablet TAKE ONE TABLET BY MOUTH ONCE DAILY 30 tablet 0  . Ibuprofen-Diphenhydramine Cit (ADVIL PM PO) Take by mouth.    . potassium chloride (K-DUR) 10 MEQ tablet TAKE ONE TABLET BY MOUTH DAILY 30 tablet 0  . Vitamin D, Ergocalciferol, (DRISDOL) 50000 units CAPS capsule TAKE 1 CAPSULES BY MOUTH EVERY 7 DAYS 4 capsule 0   Current Facility-Administered Medications on File Prior to Visit  Medication Dose Route Frequency Provider Last Rate Last Dose  . 0.9 %  sodium chloride infusion  500 mL Intravenous Continuous Nandigam, Venia Minks, MD       Past Surgical History:  Procedure Laterality Date  . CHOLECYSTECTOMY    .  COLPOSCOPY     history of abnormal pap in remote past  . STRABISMUS SURGERY     childhood    ROS as in subjective   Objective: BP 130/78   Pulse 65   Wt 194 lb 12.8 oz (88.4 kg)   SpO2 98%   BMI 35.63 kg/m   BP Readings from Last 3 Encounters:  06/23/16 130/78  10/01/15 125/90  06/06/15 118/80   Wt Readings from Last 3 Encounters:  06/23/16 194 lb 12.8 oz (88.4 kg)  10/01/15 187 lb (84.8 kg)  09/18/15 187 lb 12.8 oz (85.2 kg)   General appearance: alert, no distress, WD/WN, obese AA female Neck: supple, no lymphadenopathy, generalized mild goiter, no specific masses, no bruits Heart: RRR, normal S1, S2, no murmurs Lungs: CTA bilaterally, no wheezes, rhonchi, or rales Abdomen: +bs, soft, non tender, non distended, no masses, no hepatomegaly, no splenomegaly Pulses:1+ pedal pulses, 2+ UE pulses Ext: no edema MSK: right shoulder nontender, but mild pain with empty can, neers tests.  otherwise no deformity, no other tenders or swelling.   No laxity.  There are 2 keloid scar on superior portion of right shoulder.  bilat upper thighs with several small subcutaneous nodules, mostly 84mm diameter or less, tender.   Otherwise UE and LE with normal ROM, no swelling, no laxity or  deformity otherwise Back: mild tenderness right upper back with spasm. otherwise back nontender Neuro: arms and legs with normal strength, DTRs, sensation    Assessment Encounter Diagnoses  Name Primary?  . Essential hypertension Yes  . Goiter   . Impaired fasting blood sugar   . Exophthalmos   . Class 2 obesity with serious comorbidity in adult, unspecified BMI, unspecified obesity type   . Tobacco use disorder   . Pain in both thighs   . Decreased pedal pulses   . Chronic right shoulder pain   . Upper back pain     Plan HTN - c/t same medications . Return soon for fasting labs and physical  impaired glucose - return soon for fasting labs  exophthalmos - return soon for fasting  labs  advised she stop tobacco  Chronic right shoulder and upper back pain - go for shoulder xray.  Consider massage therapy.  Can use flexeril as discussed, OTC NSAID  decrease pedal pulses - will send for ABIs  Pain in thighs - etiology unclear, return for labs   Ariel Sweeney was seen today for med check.  Diagnoses and all orders for this visit:  Essential hypertension -     Comprehensive metabolic panel; Future -     CBC; Future -     Lipid panel; Future -     TSH; Future -     Hemoglobin A1c; Future -     T4, free; Future  Goiter -     TSH; Future -     Thyroid Peroxidase Antibody; Future -     T4, free; Future  Impaired fasting blood sugar -     Hemoglobin A1c; Future  Exophthalmos -     TSH; Future -     Thyroid Peroxidase Antibody; Future -     T4, free; Future  Class 2 obesity with serious comorbidity in adult, unspecified BMI, unspecified obesity type -     Hemoglobin A1c; Future  Tobacco use disorder  Pain in both thighs -     CK; Future -     VAS [place] ABI WITH/WO TBI; Future  Decreased pedal pulses -     CK; Future -     VAS [place] ABI WITH/WO TBI; Future  Chronic right shoulder pain -     XR Shoulder Right  Upper back pain  Other orders -     cyclobenzaprine (FLEXERIL) 10 MG tablet; 1/2-1 tablet po as needed QHS  For back pain

## 2016-06-24 ENCOUNTER — Other Ambulatory Visit: Payer: Self-pay | Admitting: Medical

## 2016-06-24 DIAGNOSIS — R0989 Other specified symptoms and signs involving the circulatory and respiratory systems: Secondary | ICD-10-CM

## 2016-06-24 NOTE — Telephone Encounter (Signed)
Can you put in the order?

## 2016-06-24 NOTE — Telephone Encounter (Signed)
Order is in.

## 2016-06-25 NOTE — Telephone Encounter (Signed)
Pt was set up

## 2016-06-26 ENCOUNTER — Ambulatory Visit (HOSPITAL_COMMUNITY)
Admission: RE | Admit: 2016-06-26 | Discharge: 2016-06-26 | Disposition: A | Discharge status: Home / Self Care | Payer: BLUE CROSS/BLUE SHIELD | Source: Ambulatory Visit | Attending: Medical | Admitting: Medical

## 2016-06-26 DIAGNOSIS — R0989 Other specified symptoms and signs involving the circulatory and respiratory systems: Secondary | ICD-10-CM | POA: Diagnosis not present

## 2016-06-26 DIAGNOSIS — M79651 Pain in right thigh: Secondary | ICD-10-CM | POA: Diagnosis not present

## 2016-06-26 DIAGNOSIS — M79652 Pain in left thigh: Secondary | ICD-10-CM

## 2016-06-26 NOTE — Progress Notes (Signed)
VASCULAR LAB PRELIMINARY  ARTERIAL  ABI completed:    RIGHT    LEFT    PRESSURE WAVEFORM  PRESSURE WAVEFORM  BRACHIAL 138 Triphasic BRACHIAL 133 Triphasic  DP 127 Triphasic DP 137 Triphasic  PT 144 Triphasic PT 143 Triphasic    RIGHT LEFT  ABI 1.04 1.04   ABIs and Doppler waveforms indicate normal arterial flow bilaterally at rest  Sufyan Meidinger, RVS 06/26/2016, 4:50 PM

## 2016-07-07 ENCOUNTER — Ambulatory Visit
Admission: RE | Admit: 2016-07-07 | Discharge: 2016-07-07 | Disposition: A | Discharge status: Home / Self Care | Payer: BLUE CROSS/BLUE SHIELD | Source: Ambulatory Visit | Attending: Medical | Admitting: Medical

## 2016-07-07 ENCOUNTER — Other Ambulatory Visit: Payer: Self-pay

## 2016-07-07 DIAGNOSIS — M25511 Pain in right shoulder: Secondary | ICD-10-CM

## 2016-07-07 DIAGNOSIS — M19011 Primary osteoarthritis, right shoulder: Secondary | ICD-10-CM | POA: Diagnosis not present

## 2016-07-09 ENCOUNTER — Ambulatory Visit (INDEPENDENT_AMBULATORY_CARE_PROVIDER_SITE_OTHER): Payer: BLUE CROSS/BLUE SHIELD | Admitting: Medical

## 2016-07-09 ENCOUNTER — Encounter: Payer: Self-pay | Admitting: Medical

## 2016-07-09 VITALS — BP 122/80 | HR 86 | Ht 62.0 in | Wt 190.8 lb

## 2016-07-09 DIAGNOSIS — Z124 Encounter for screening for malignant neoplasm of cervix: Secondary | ICD-10-CM

## 2016-07-09 DIAGNOSIS — R0989 Other specified symptoms and signs involving the circulatory and respiratory systems: Secondary | ICD-10-CM

## 2016-07-09 DIAGNOSIS — Z0000 Encounter for general adult medical examination without abnormal findings: Secondary | ICD-10-CM

## 2016-07-09 DIAGNOSIS — E669 Obesity, unspecified: Secondary | ICD-10-CM | POA: Diagnosis not present

## 2016-07-09 DIAGNOSIS — H0520 Unspecified exophthalmos: Secondary | ICD-10-CM

## 2016-07-09 DIAGNOSIS — M25511 Pain in right shoulder: Secondary | ICD-10-CM | POA: Diagnosis not present

## 2016-07-09 DIAGNOSIS — E559 Vitamin D deficiency, unspecified: Secondary | ICD-10-CM

## 2016-07-09 DIAGNOSIS — M79652 Pain in left thigh: Secondary | ICD-10-CM

## 2016-07-09 DIAGNOSIS — F17200 Nicotine dependence, unspecified, uncomplicated: Secondary | ICD-10-CM | POA: Diagnosis not present

## 2016-07-09 DIAGNOSIS — Z1231 Encounter for screening mammogram for malignant neoplasm of breast: Secondary | ICD-10-CM | POA: Diagnosis not present

## 2016-07-09 DIAGNOSIS — Z1239 Encounter for other screening for malignant neoplasm of breast: Secondary | ICD-10-CM

## 2016-07-09 DIAGNOSIS — I10 Essential (primary) hypertension: Secondary | ICD-10-CM

## 2016-07-09 DIAGNOSIS — R7301 Impaired fasting glucose: Secondary | ICD-10-CM | POA: Diagnosis not present

## 2016-07-09 DIAGNOSIS — IMO0001 Reserved for inherently not codable concepts without codable children: Secondary | ICD-10-CM

## 2016-07-09 DIAGNOSIS — E049 Nontoxic goiter, unspecified: Secondary | ICD-10-CM | POA: Diagnosis not present

## 2016-07-09 DIAGNOSIS — M79651 Pain in right thigh: Secondary | ICD-10-CM

## 2016-07-09 LAB — T4, FREE: Free T4: 1.3 ng/dL (ref 0.8–1.8)

## 2016-07-09 LAB — POCT URINALYSIS DIP (PROADVANTAGE DEVICE)
Bilirubin, UA: NEGATIVE
Glucose, UA: NEGATIVE mg/dL
Ketones, POC UA: NEGATIVE mg/dL
Leukocytes, UA: NEGATIVE
Nitrite, UA: NEGATIVE
Specific Gravity, Urine: 1.025
Urobilinogen, Ur: NEGATIVE
pH, UA: 6 (ref 5.0–8.0)

## 2016-07-09 LAB — CBC
HCT: 42.7 % (ref 35.0–45.0)
Hemoglobin: 14.2 g/dL (ref 11.7–15.5)
MCH: 29.1 pg (ref 27.0–33.0)
MCHC: 33.3 g/dL (ref 32.0–36.0)
MCV: 87.5 fL (ref 80.0–100.0)
MPV: 10.3 fL (ref 7.5–12.5)
Platelets: 263 10*3/uL (ref 140–400)
RBC: 4.88 MIL/uL (ref 3.80–5.10)
RDW: 13.5 % (ref 11.0–15.0)
WBC: 5.1 10*3/uL (ref 4.0–10.5)

## 2016-07-09 LAB — TSH: TSH: 1.14 mIU/L

## 2016-07-09 MED ORDER — ASPIRIN EC 81 MG PO TBEC
81.0000 mg | DELAYED_RELEASE_TABLET | Freq: Every day | ORAL | 3 refills | Status: DC
Start: 2016-07-09 — End: 2017-03-03

## 2016-07-09 MED ORDER — CYCLOBENZAPRINE HCL 10 MG PO TABS: ORAL_TABLET | ORAL | 1 refills | Status: DC

## 2016-07-09 MED ORDER — POTASSIUM CHLORIDE ER 10 MEQ PO TBCR: 10.0000 meq | EXTENDED_RELEASE_TABLET | Freq: Every day | ORAL | 3 refills | Status: DC

## 2016-07-09 MED ORDER — AMLODIPINE BESYLATE 5 MG PO TABS: 5.0000 mg | ORAL_TABLET | Freq: Every day | ORAL | 3 refills | Status: DC

## 2016-07-09 MED ORDER — ENALAPRIL-HYDROCHLOROTHIAZIDE 10-25 MG PO TABS: 1.0000 | ORAL_TABLET | Freq: Every day | ORAL | 3 refills | Status: DC

## 2016-07-09 NOTE — Patient Instructions (Signed)
I recommend you have a shingles vaccine to help prevent shingles or herpes zoster outbreak.   Please call your insurer to inquire about coverage for the Shingrix vaccine given in 2 doses.   Some insurers cover this vaccine after age 51, some cover this after age 3.  If your insurer covers this, then call to schedule appointment to have this vaccine here.  See your eye doctor yearly for routine vision care.  See your dentist yearly for routine dental care including hygiene visits twice yearly.

## 2016-07-09 NOTE — Addendum Note (Signed)
Addended by: Carlena Hurl on: 07/09/2016 09:52 AM   Modules accepted: Orders

## 2016-07-09 NOTE — Progress Notes (Addendum)
Subjective:   HPI  Ariel Sweeney is a 51 y.o. female who presents for a complete physical.  Concerns: Right shoulder Sweeney on and off, upper right back Sweeney and thigh Sweeney from her recent visit here.  Gynecology history - hx/o 4 pregnancy, 1 live birth (78yo daughter), 2 miscarriage, 1 abortion.   Having periods, but been a little variable.  Now having periods about every 3 months.  Mother had partial hysterectomy.  Currently not sexually active, declines STD screening.    Reviewed their medical, surgical, family, social, medication, and allergy history and updated chart as appropriate.  Past Medical History:  Diagnosis Date  . Decreased pulse    normal ABIs 06/2016  . GERD (gastroesophageal reflux disease)    intermittent  . Goiter   . Hypertension   . Obesity   . Tobacco use   . Vitamin D deficiency   . Wears contact lenses     Past Surgical History:  Procedure Laterality Date  . CHOLECYSTECTOMY    . COLONOSCOPY  09/2015   tubular adenoma, repeat 5 years;  Dr. Harl Bowie  . COLPOSCOPY     history of abnormal pap in remote past  . STRABISMUS SURGERY     childhood    Social History   Social History  . Marital status: Single    Spouse name: N/A  . Number of children: N/A  . Years of education: N/A   Occupational History  . Not on file.   Social History Main Topics  . Smoking status: Current Some Day Smoker    Packs/day: 0.25    Years: 26.00  . Smokeless tobacco: Never Used  . Alcohol use 1.8 oz/week    1 Glasses of wine, 1 Cans of beer, 1 Shots of liquor per week     Comment: 4 weekly  . Drug use: No  . Sexual activity: Not on file   Other Topics Concern  . Not on file   Social History Narrative   Lives alone.   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 Diabetes Mother   . Hypertension Mother   . Kidney disease Mother   . Anemia Mother   .  Hypertension Father   . Diabetes Father   . Heart disease Father   . Hypertension Sister   . Stroke Maternal Aunt   . Cancer Neg Hx   . Colon cancer Neg Hx      Current Outpatient Prescriptions:  .  amLODipine (NORVASC) 5 MG tablet, Take 1 tablet (5 mg total) by mouth daily., Disp: 90 tablet, Rfl: 3 .  aspirin EC 81 MG tablet, Take 1 tablet (81 mg total) by mouth daily., Disp: 90 tablet, Rfl: 3 .  cyanocobalamin 500 MCG tablet, Take 500 mcg by mouth every other day., Disp: , Rfl:  .  cyclobenzaprine (FLEXERIL) 10 MG tablet, 1/2-1 tablet po as needed QHS  For back Sweeney, Disp: 30 tablet, Rfl: 1 .  enalapril-hydrochlorothiazide (VASERETIC) 10-25 MG tablet, Take 1 tablet by mouth daily., Disp: 90 tablet, Rfl: 3 .  Ibuprofen-Diphenhydramine Cit (ADVIL PM PO), Take by mouth., Disp: , Rfl:  .  potassium chloride (K-DUR) 10 MEQ tablet, Take 1 tablet (10 mEq total) by mouth daily., Disp: 90 tablet, Rfl: 3 .  Vitamin D, Ergocalciferol, (DRISDOL) 50000 units CAPS capsule, TAKE 1 CAPSULES BY MOUTH EVERY 7 DAYS, Disp:  4 capsule, Rfl: 0  Current Facility-Administered Medications:  .  0.9 %  sodium chloride infusion, 500 mL, Intravenous, Continuous, Nandigam, Kavitha V, MD  No Known Allergies  Review of Systems Constitutional: -fever, -chills, -sweats, -unexpected weight change, -decreased appetite, -fatigue Allergy: -sneezing, -itching, -congestion Dermatology: -changing moles, --rash, -lumps ENT: -runny nose, -ear Sweeney, -sore throat, -hoarseness, -sinus Sweeney, -teeth Sweeney, - ringing in ears, -hearing loss, -nosebleeds Cardiology: -chest Sweeney, -palpitations, -swelling, -difficulty breathing when lying flat, -waking up short of breath Respiratory: -cough, -shortness of breath, -difficulty breathing with exercise or exertion, -wheezing, -coughing up blood Gastroenterology: -abdominal Sweeney, -nausea, -vomiting, -diarrhea, -constipation, -blood in stool, -changes in bowel movement, -difficulty swallowing  or eating Hematology: -bleeding, -bruising  Musculoskeletal: +joint aches, +muscle aches, -joint swelling, +occasional low back Sweeney, -neck Sweeney, -cramping, -changes in gait Ophthalmology: denies vision changes, eye redness, itching, discharge Urology: -burning with urination, -difficulty urinating, -blood in urine, -urinary frequency, -urgency, -incontinence Neurology: -headache, -weakness, -tingling, -numbness, -memory loss, -falls, -dizziness Psychology: -depressed mood, -agitation, -sleep problems     Objective:   Physical Exam  BP 122/80   Pulse 86   Ht 5\' 2"  (1.575 m)   Wt 190 lb 12.8 oz (86.5 kg)   SpO2 97%   BMI 34.90 kg/m   General appearance: alert, no distress, WD/WN, obese AA female Skin: several keloids left and right upper shoulders posteriorly HEENT: normocephalic, conjunctiva/corneas normal, sclerae anicteric, PERRLA, EOMi, nares patent, no discharge or erythema, pharynx normal Oral cavity: MMM, tongue normal, teeth with moderate plaque Neck: supple, no lymphadenopathy, moderate goiter, no masses, normal ROM, no bruits Chest: 2 vertical linear keloids mid chest, non tender, normal shape and expansion Heart: RRR, normal S1, S2, no murmurs Lungs: CTA bilaterally, no wheezes, rhonchi, or rales Abdomen: +bs, soft, few small laparoscopic surgical scars with keloid tissue upper abdomen and umbilicus, non tender, non distended, no masses, no hepatosplenomegaly, no bruits Back: right upper paraspinal tenderness, otherwise non tender, normal ROM, no scoliosis Musculoskeletal: right shoulder nontneder, normal ROM without much Sweeney, no swelling or deformity, upper extremities non tender, no obvious deformity, normal ROM throughout, lower extremities non tender, no obvious deformity, normal ROM throughout Extremities: no edema, no cyanosis, no clubbing Pulses: 2+ symmetric, upper and lower extremities, normal cap refill Neurological: alert, oriented x 3, CN2-12 intact, strength  normal upper extremities and lower extremities, sensation normal throughout, DTRs 2+ throughout, no cerebellar signs, gait normal Psychiatric: normal affect, behavior normal, pleasant  Breast: nontender, no masses or lumps, no skin changes, no nipple discharge or inversion, no axillary lymphadenopathy Gyn/rectal-deferred   Assessment and Plan :    Encounter Diagnoses  Name Primary?  . Encounter for health maintenance examination in adult Yes  . Essential hypertension   . Goiter   . Impaired fasting blood sugar   . Decreased pedal pulses   . Exophthalmos   . Class 2 obesity with serious comorbidity in adult, unspecified BMI, unspecified obesity type   . Right shoulder Sweeney, unspecified chronicity   . Screening for breast cancer   . Tobacco use disorder   . Screening for cervical cancer   . Vitamin D deficiency   . Sweeney in both thighs      Physical exam - discussed healthy lifestyle, diet, exercise, preventative care, vaccinations, and addressed their concerns.  See your eye doctor yearly for routine vision care. See your dentist yearly for routine dental care including hygiene visits twice yearly. HTN - c/t same medication Goiter, exophthalmos - labs ordered  Tobacco use - advised cessation She will schedule a mammogram Shoulder Sweeney, upper back Sweeney - gave demonstration on home exercise and stretching, rotator cuff strengthening exercises Thigh Sweeney - labs today, recent ABIs normal Routine labs today obesity - work on efforts at weight loss through lifestyle changes as discussed Advised she check insurance coverage on shingrix vaccine Declines flu vaccine.  Follow-up pending labs   Katonya was seen today for annual exam.  Diagnoses and all orders for this visit:  Encounter for health maintenance examination in adult -     POCT Urinalysis DIP (Proadvantage Device) -     TSH -     Comprehensive metabolic panel -     CBC -     CK -     Lipid panel -     Thyroid Peroxidase  Antibody -     T4, free -     Hemoglobin A1c -     Cancel: Cytology - PAP -     VITAMIN D 25 Hydroxy (Vit-D Deficiency, Fractures)  Essential hypertension -     Comprehensive metabolic panel -     Lipid panel -     Hemoglobin A1c  Goiter -     TSH -     Thyroid Peroxidase Antibody -     T4, free  Impaired fasting blood sugar -     Hemoglobin A1c  Decreased pedal pulses  Exophthalmos -     TSH -     Thyroid Peroxidase Antibody -     T4, free  Class 2 obesity with serious comorbidity in adult, unspecified BMI, unspecified obesity type -     TSH -     Thyroid Peroxidase Antibody -     T4, free  Right shoulder Sweeney, unspecified chronicity  Screening for breast cancer -     MM DIGITAL SCREENING BILATERAL; Future  Tobacco use disorder  Screening for cervical cancer -     Cancel: Cytology - PAP  Vitamin D deficiency -     VITAMIN D 25 Hydroxy (Vit-D Deficiency, Fractures)  Sweeney in both thighs  Other orders -     amLODipine (NORVASC) 5 MG tablet; Take 1 tablet (5 mg total) by mouth daily. -     aspirin EC 81 MG tablet; Take 1 tablet (81 mg total) by mouth daily. -     cyclobenzaprine (FLEXERIL) 10 MG tablet; 1/2-1 tablet po as needed QHS  For back Sweeney -     enalapril-hydrochlorothiazide (VASERETIC) 10-25 MG tablet; Take 1 tablet by mouth daily. -     potassium chloride (K-DUR) 10 MEQ tablet; Take 1 tablet (10 mEq total) by mouth daily.

## 2016-07-10 ENCOUNTER — Other Ambulatory Visit: Payer: Self-pay | Admitting: Medical

## 2016-07-10 LAB — LIPID PANEL
Cholesterol: 207 mg/dL — ABNORMAL HIGH (ref <200)
HDL: 41 mg/dL — ABNORMAL LOW (ref >50)
LDL Cholesterol: 104 mg/dL — ABNORMAL HIGH (ref <100)
Total CHOL/HDL Ratio: 5 Ratio — ABNORMAL HIGH (ref <5.0)
Triglycerides: 308 mg/dL — ABNORMAL HIGH (ref <150)
VLDL: 62 mg/dL — ABNORMAL HIGH (ref <30)

## 2016-07-10 LAB — COMPREHENSIVE METABOLIC PANEL
ALT: 25 U/L (ref 6–29)
AST: 23 U/L (ref 10–35)
Albumin: 4.2 g/dL (ref 3.6–5.1)
Alkaline Phosphatase: 57 U/L (ref 33–130)
BUN: 17 mg/dL (ref 7–25)
CO2: 25 mmol/L (ref 20–31)
Calcium: 9.3 mg/dL (ref 8.6–10.4)
Chloride: 102 mmol/L (ref 98–110)
Creat: 1.11 mg/dL — ABNORMAL HIGH (ref 0.50–1.05)
Glucose, Bld: 108 mg/dL — ABNORMAL HIGH (ref 65–99)
Potassium: 3.3 mmol/L — ABNORMAL LOW (ref 3.5–5.3)
Sodium: 139 mmol/L (ref 135–146)
Total Bilirubin: 0.5 mg/dL (ref 0.2–1.2)
Total Protein: 7.2 g/dL (ref 6.1–8.1)

## 2016-07-10 LAB — HEMOGLOBIN A1C
Hgb A1c MFr Bld: 6 % — ABNORMAL HIGH (ref <5.7)
Mean Plasma Glucose: 126 mg/dL

## 2016-07-10 LAB — CK: Total CK: 400 U/L — ABNORMAL HIGH (ref 29–143)

## 2016-07-10 LAB — VITAMIN D 25 HYDROXY (VIT D DEFICIENCY, FRACTURES): Vit D, 25-Hydroxy: 41 ng/mL (ref 30–100)

## 2016-07-10 LAB — THYROID PEROXIDASE ANTIBODY: Thyroperoxidase Ab SerPl-aCnc: 1 IU/mL (ref <9)

## 2016-07-10 MED ORDER — PRAVASTATIN SODIUM 20 MG PO TABS: 20.0000 mg | ORAL_TABLET | Freq: Every evening | ORAL | 0 refills | Status: DC

## 2016-07-10 MED ORDER — POTASSIUM CHLORIDE ER 10 MEQ PO TBCR: 10.0000 meq | EXTENDED_RELEASE_TABLET | Freq: Two times a day (BID) | ORAL | 3 refills | Status: DC

## 2016-07-10 MED ORDER — VITAMIN D (ERGOCALCIFEROL) 1.25 MG (50000 UNIT) PO CAPS: ORAL_CAPSULE | ORAL | 3 refills | Status: DC

## 2016-07-10 NOTE — Progress Notes (Signed)
Labs show the following: Potasium a little low, there was microscopic blood in urine, cholesterol does not look good, kidney marker is a little abnormal, and marker called CK which can represent muscle inflammation was elevated a little.  recommendations; 1-c/t same medication but change potassium to twice daily.  I re-sent this today.   If she got this filled yesterday, change to BID ,but the new script is listed as BID 2-begin trial of Pravachol QHS for cholesterol 3-call insurance about Shingrix vaccine 4-we will call with pap and mammogram results 5-have her drink at least 64oz water daily 6-plan f/u visit in 6wk on all of the above issues, sooner if leg cramps/leg pain/thigh pain not improving.

## 2016-08-27 ENCOUNTER — Ambulatory Visit: Payer: BLUE CROSS/BLUE SHIELD | Admitting: Medical

## 2016-08-28 ENCOUNTER — Encounter: Payer: Self-pay | Admitting: Medical

## 2016-09-13 ENCOUNTER — Other Ambulatory Visit: Payer: Self-pay | Admitting: Medical

## 2016-09-16 NOTE — Telephone Encounter (Signed)
Can pt have a refill on this 

## 2016-10-16 ENCOUNTER — Other Ambulatory Visit: Payer: Self-pay | Admitting: Medical

## 2016-11-26 ENCOUNTER — Ambulatory Visit (HOSPITAL_COMMUNITY)
Admission: EM | Admit: 2016-11-26 | Discharge: 2016-11-26 | Disposition: A | Discharge status: Home / Self Care | Payer: BLUE CROSS/BLUE SHIELD | Attending: Family Medicine | Admitting: Family Medicine

## 2016-11-26 ENCOUNTER — Other Ambulatory Visit: Payer: Self-pay

## 2016-11-26 ENCOUNTER — Encounter (HOSPITAL_COMMUNITY): Payer: Self-pay | Admitting: Emergency Medicine

## 2016-11-26 ENCOUNTER — Ambulatory Visit (INDEPENDENT_AMBULATORY_CARE_PROVIDER_SITE_OTHER): Payer: BLUE CROSS/BLUE SHIELD

## 2016-11-26 DIAGNOSIS — S27818A Other injury of esophagus (thoracic part), initial encounter: Secondary | ICD-10-CM | POA: Diagnosis not present

## 2016-11-26 DIAGNOSIS — R1310 Dysphagia, unspecified: Secondary | ICD-10-CM | POA: Diagnosis not present

## 2016-11-26 DIAGNOSIS — F17210 Nicotine dependence, cigarettes, uncomplicated: Secondary | ICD-10-CM | POA: Diagnosis not present

## 2016-11-26 DIAGNOSIS — R1319 Other dysphagia: Secondary | ICD-10-CM

## 2016-11-26 DIAGNOSIS — K219 Gastro-esophageal reflux disease without esophagitis: Secondary | ICD-10-CM | POA: Diagnosis not present

## 2016-11-26 DIAGNOSIS — R1314 Dysphagia, pharyngoesophageal phase: Secondary | ICD-10-CM | POA: Diagnosis not present

## 2016-11-26 DIAGNOSIS — R0989 Other specified symptoms and signs involving the circulatory and respiratory systems: Secondary | ICD-10-CM | POA: Diagnosis not present

## 2016-11-26 DIAGNOSIS — Z7289 Other problems related to lifestyle: Secondary | ICD-10-CM | POA: Diagnosis not present

## 2016-11-26 NOTE — ED Provider Notes (Signed)
[facility]   [id number] 11/26/16 Arrival Time: 20   SUBJECTIVE:  Ariel Sweeney is a 51 y.o. female who presents to the urgent care with complaint of foreign body sensation in lower neck which is constant since a week ago Tuesday(8 days) after eating chili.  The sensation is worsened by swallowing.  Patient has a h/o thyroid goiter which her doctor is following     Past Medical History:  Diagnosis Date  . Decreased pulse    normal ABIs 06/2016  . GERD (gastroesophageal reflux disease)    intermittent  . Goiter   . Hypertension   . Obesity   . Tobacco use   . Vitamin D deficiency   . Wears contact lenses    Family History  Problem Relation Age of Onset  . Diabetes Mother   . Hypertension Mother   . Kidney disease Mother   . Anemia Mother   . Hypertension Father   . Diabetes Father   . Heart disease Father   . Hypertension Sister   . Stroke Maternal Aunt   . Cancer Neg Hx   . Colon cancer Neg Hx    Social History   Socioeconomic History  . Marital status: Single    Spouse name: Not on file  . Number of children: Not on file  . Years of education: Not on file  . Highest education level: Not on file  Social Needs  . Financial resource strain: Not on file  . Food insecurity - worry: Not on file  . Food insecurity - inability: Not on file  . Transportation needs - medical: Not on file  . Transportation needs - non-medical: Not on file  Occupational History  . Not on file  Tobacco Use  . Smoking status: Current Some Day Smoker    Packs/day: 0.25    Years: 26.00    Pack years: 6.50  . Smokeless tobacco: Never Used  Substance and Sexual Activity  . Alcohol use: Yes    Alcohol/week: 1.8 oz    Types: 1 Glasses of wine, 1 Cans of beer, 1 Shots of liquor per week    Comment: 4 weekly  . Drug use: No  . Sexual activity: Not on file  Other Topics Concern  . Not on file  Social History Narrative   Lives alone.   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   No Known Allergies    ROS: As per HPI, remainder of ROS negative.   OBJECTIVE:   Vitals:   11/26/16 1403  BP: 111/80  Pulse: 79  Resp: 18  Temp: 98 F (36.7 C)  TempSrc: Oral  SpO2: 97%     General appearance: alert; no distress Eyes: PERRL; EOMI; conjunctiva normal; slight exophthalmos bilaterally HENT: normocephalic; atraumatic;external ears normal without trauma; nasal mucosa normal; oral mucosa normal Neck: supple, markedly enlarged thyroid diffusely with no nodularity. Lungs: clear to auscultation bilaterally Heart: regular rate and rhythm Back: no CVA tenderness Extremities: no cyanosis or edema; symmetrical with no gross deformities Skin: warm and dry Neurologic: normal gait; grossly normal Psychological: alert and cooperative; normal mood and affect   EXAM: THYROID ULTRASOUND  TECHNIQUE: Ultrasound examination of the thyroid gland and adjacent soft tissues was performed.  COMPARISON:  None.  FINDINGS: Right thyroid lobe  Measurements: 5.8 x 1.9 x 2.0 cm. 0.2  cm hypoechoic structure in the inferior right thyroid lobe probably represents a small cyst. No other right thyroid nodules or cyst.  Left thyroid lobe  Measurements: 4.8 x 1.7 x 2.0 cm. Small hypoechoic nodule or cyst in the medial left thyroid lobe measuring up to 0.3 cm.  Isthmus  Thickness: 0.5 cm.  No nodules visualized.  Lymphadenopathy  None visualized.  IMPRESSION: Small bilateral thyroid nodules or cysts.   Electronically Signed   By: Markus Daft M.D.   On: 04/21/2014 16:42    Labs:  Results for orders placed or performed in visit on 07/09/16  TSH  Result Value Ref Range   TSH 1.14 mIU/L  Comprehensive metabolic panel  Result Value Ref Range   Sodium 139 135 - 146 mmol/L   Potassium  3.3 (L) 3.5 - 5.3 mmol/L   Chloride 102 98 - 110 mmol/L   CO2 25 20 - 31 mmol/L   Glucose, Bld 108 (H) 65 - 99 mg/dL   BUN 17 7 - 25 mg/dL   Creat 1.11 (H) 0.50 - 1.05 mg/dL   Total Bilirubin 0.5 0.2 - 1.2 mg/dL   Alkaline Phosphatase 57 33 - 130 U/L   AST 23 10 - 35 U/L   ALT 25 6 - 29 U/L   Total Protein 7.2 6.1 - 8.1 g/dL   Albumin 4.2 3.6 - 5.1 g/dL   Calcium 9.3 8.6 - 10.4 mg/dL  CBC  Result Value Ref Range   WBC 5.1 4.0 - 10.5 K/uL   RBC 4.88 3.80 - 5.10 MIL/uL   Hemoglobin 14.2 11.7 - 15.5 g/dL   HCT 42.7 35.0 - 45.0 %   MCV 87.5 80.0 - 100.0 fL   MCH 29.1 27.0 - 33.0 pg   MCHC 33.3 32.0 - 36.0 g/dL   RDW 13.5 11.0 - 15.0 %   Platelets 263 140 - 400 K/uL   MPV 10.3 7.5 - 12.5 fL  CK  Result Value Ref Range   Total CK 400 (H) 29 - 143 U/L  Lipid panel  Result Value Ref Range   Cholesterol 207 (H) <200 mg/dL   Triglycerides 308 (H) <150 mg/dL   HDL 41 (L) >50 mg/dL   Total CHOL/HDL Ratio 5.0 (H) <5.0 Ratio   VLDL 62 (H) <30 mg/dL   LDL Cholesterol 104 (H) <100 mg/dL  Thyroid Peroxidase Antibody  Result Value Ref Range   Thyroperoxidase Ab SerPl-aCnc 1 <9 IU/mL  T4, free  Result Value Ref Range   Free T4 1.3 0.8 - 1.8 ng/dL  Hemoglobin A1c  Result Value Ref Range   Hgb A1c MFr Bld 6.0 (H) <5.7 %   Mean Plasma Glucose 126 mg/dL  VITAMIN D 25 Hydroxy (Vit-D Deficiency, Fractures)  Result Value Ref Range   Vit D, 25-Hydroxy 41 30 - 100 ng/mL  POCT Urinalysis DIP (Proadvantage Device)  Result Value Ref Range   Color, UA yellow yellow   Clarity, UA clear clear   Glucose, UA negative negative mg/dL   Bilirubin, UA negative negative   Ketones, POC UA negative negative mg/dL   Specific Gravity, Urine 1.025    Blood, UA small (A) negative   pH, UA 6.0 5.0 - 8.0   Protein Ur, POC trace (A) negative mg/dL   Urobilinogen, Ur neg    Nitrite, UA Negative Negative   Leukocytes, UA Negative Negative    Labs Reviewed - No data to display  Dg Neck Soft  Tissue  Result Date: 11/26/2016 CLINICAL DATA:  Foreign  body sensation EXAM: NECK SOFT TISSUES - 1+ VIEW COMPARISON:  None. FINDINGS: Prevertebral soft tissue thickness appears within normal limits. No retropharyngeal gas. Degenerative changes of the cervical spine. No radiopaque foreign body is seen. Subglottic trachea patent. Epiglottis within normal limits. IMPRESSION: Negative. Electronically Signed   By: Donavan Foil M.D.   On: 11/26/2016 14:45      ASSESSMENT & PLAN:  1. Esophageal dysphagia     ENT referral made  Reviewed expectations re: course of current medical issues. Questions answered. Outlined signs and symptoms indicating need for more acute intervention. Patient verbalized understanding. After Visit Summary given.      Robyn Haber, MD 11/26/16 1505

## 2016-11-26 NOTE — ED Triage Notes (Signed)
Patient says she had dental work done on 11/7.  Afterwards was eating chili, denies getting strangled, but thinks something is lodged in throat.    Says she feels liquids and foods going around something in throat

## 2016-11-26 NOTE — Discharge Instructions (Signed)
Although radiologist reads the x-ray as negative, I can see a small calcified area in the esophagus behind the thyroid cartilage.  Dg Neck Soft Tissue  Result Date: 11/26/2016 CLINICAL DATA:  Foreign body sensation EXAM: NECK SOFT TISSUES - 1+ VIEW COMPARISON:  None. FINDINGS: Prevertebral soft tissue thickness appears within normal limits. No retropharyngeal gas. Degenerative changes of the cervical spine. No radiopaque foreign body is seen. Subglottic trachea patent. Epiglottis within normal limits. IMPRESSION: Negative. Electronically Signed   By: Donavan Foil M.D.   On: 11/26/2016 14:45   Go immediately to Dr. Noreene Filbert office

## 2017-01-30 ENCOUNTER — Other Ambulatory Visit: Payer: Self-pay | Admitting: Medical

## 2017-02-02 ENCOUNTER — Telehealth: Payer: Self-pay

## 2017-02-02 NOTE — Telephone Encounter (Signed)
This has already been sent to the phamacy and made the appt.

## 2017-02-02 NOTE — Telephone Encounter (Signed)
Pharmacy is requesting refill for pravastatin 20mg  . Pt was last seen in 07-09-16 for CPE. Next ov is 02-04-17. Pharmacy is [facility] on [address] and their fax number is [phone]. Thanks [employer]

## 2017-02-04 ENCOUNTER — Other Ambulatory Visit: Payer: Self-pay

## 2017-02-04 ENCOUNTER — Encounter: Payer: BLUE CROSS/BLUE SHIELD | Admitting: Medical

## 2017-02-23 ENCOUNTER — Encounter: Payer: No Typology Code available for payment source | Admitting: Medical

## 2017-02-26 ENCOUNTER — Telehealth: Payer: Self-pay | Admitting: Medical

## 2017-02-26 ENCOUNTER — Encounter: Payer: Self-pay | Admitting: Medical

## 2017-02-26 ENCOUNTER — Ambulatory Visit (INDEPENDENT_AMBULATORY_CARE_PROVIDER_SITE_OTHER): Payer: No Typology Code available for payment source | Admitting: Medical

## 2017-02-26 VITALS — BP 128/82 | HR 65 | Ht 61.5 in | Wt 188.0 lb

## 2017-02-26 DIAGNOSIS — H0520 Unspecified exophthalmos: Secondary | ICD-10-CM | POA: Diagnosis not present

## 2017-02-26 DIAGNOSIS — M79601 Pain in right arm: Secondary | ICD-10-CM | POA: Diagnosis not present

## 2017-02-26 DIAGNOSIS — R7301 Impaired fasting glucose: Secondary | ICD-10-CM

## 2017-02-26 DIAGNOSIS — Z1231 Encounter for screening mammogram for malignant neoplasm of breast: Secondary | ICD-10-CM

## 2017-02-26 DIAGNOSIS — E049 Nontoxic goiter, unspecified: Secondary | ICD-10-CM

## 2017-02-26 DIAGNOSIS — E559 Vitamin D deficiency, unspecified: Secondary | ICD-10-CM

## 2017-02-26 DIAGNOSIS — F17200 Nicotine dependence, unspecified, uncomplicated: Secondary | ICD-10-CM | POA: Diagnosis not present

## 2017-02-26 DIAGNOSIS — I10 Essential (primary) hypertension: Secondary | ICD-10-CM | POA: Diagnosis not present

## 2017-02-26 DIAGNOSIS — Z0000 Encounter for general adult medical examination without abnormal findings: Secondary | ICD-10-CM

## 2017-02-26 DIAGNOSIS — H6123 Impacted cerumen, bilateral: Secondary | ICD-10-CM | POA: Diagnosis not present

## 2017-02-26 DIAGNOSIS — M25511 Pain in right shoulder: Secondary | ICD-10-CM

## 2017-02-26 DIAGNOSIS — Z1239 Encounter for other screening for malignant neoplasm of breast: Secondary | ICD-10-CM

## 2017-02-26 NOTE — Progress Notes (Signed)
Subjective:   HPI  Ariel Sweeney is a 52 y.o. female who presents for a complete physical.  Concerns: Right upper arm Sweeney x 1 year. Right shoulder Sweeney - continues to have pains in right arm.  We did xray last year, but has Sweeney now all the time.  Overhead motion is bad.  Taking ibuprofen every night.    Working on buying her first home and gettign married.  Gynecology history - hx/o 4 pregnancy, 1 live birth (17yo daughter), 2 miscarriage, 1 abortion.   Having periods, but been a little variable. having periods about every 3 months.  Mother had partial hysterectomy.   Reviewed their medical, surgical, family, social, medication, and allergy history and updated chart as appropriate.  Past Medical History:  Diagnosis Date  . Decreased pulse    normal ABIs 06/2016  . GERD (gastroesophageal reflux disease)    intermittent  . Goiter   . Hypertension   . Obesity   . Tobacco use   . Vitamin D deficiency   . Wears contact lenses     Past Surgical History:  Procedure Laterality Date  . CHOLECYSTECTOMY    . COLONOSCOPY  09/2015   tubular adenoma, repeat 5 years;  Dr. Harl Bowie  . COLPOSCOPY     history of abnormal pap in remote past  . STRABISMUS SURGERY     childhood    Social History   Socioeconomic History  . Marital status: Single    Spouse name: Not on file  . Number of children: Not on file  . Years of education: Not on file  . Highest education level: Not on file  Social Needs  . Financial resource strain: Not on file  . Food insecurity - worry: Not on file  . Food insecurity - inability: Not on file  . Transportation needs - medical: Not on file  . Transportation needs - non-medical: Not on file  Occupational History  . Not on file  Tobacco Use  . Smoking status: Current Some Day Smoker    Packs/day: 0.25    Years: 26.00    Pack years: 6.50  . Smokeless tobacco: Never Used  Substance and Sexual Activity  . Alcohol use: Yes    Alcohol/week: 1.8  oz    Types: 1 Glasses of wine, 1 Cans of beer, 1 Shots of liquor per week    Comment: 4 weekly  . Drug use: No  . Sexual activity: Not on file  Other Topics Concern  . Not on file  Social History Narrative   Lives alone.   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 Diabetes Mother   . Hypertension Mother   . Kidney disease Mother   . Anemia Mother   . Hypertension Father   . Diabetes Father   . Heart disease Father   . Hypertension Sister   . Stroke Maternal Aunt   . Cancer Neg Hx   . Colon cancer Neg Hx      Current Outpatient Medications:  .  amLODipine (NORVASC) 5 MG tablet, Take 1 tablet (5 mg total) by mouth daily., Disp: 90 tablet, Rfl: 3 .  aspirin EC 81 MG tablet, Take 1 tablet (81 mg total) by mouth daily., Disp: 90 tablet, Rfl: 3 .  cyanocobalamin 500 MCG tablet, Take 500 mcg by mouth every other day., Disp: , Rfl:  .  enalapril-hydrochlorothiazide (VASERETIC) 10-25 MG tablet, Take 1 tablet by mouth daily., Disp: 90 tablet, Rfl: 3 .  Ibuprofen-Diphenhydramine Cit (ADVIL PM PO), Take by mouth., Disp: , Rfl:  .  pravastatin (PRAVACHOL) 20 MG tablet, TAKE 1 TABLET BY MOUTH EVERY EVENING, Disp: 90 tablet, Rfl: 0 .  Vitamin D, Ergocalciferol, (DRISDOL) 50000 units CAPS capsule, TAKE 1 CAPSULES BY MOUTH EVERY 7 DAYS, Disp: 12 capsule, Rfl: 3 .  cyclobenzaprine (FLEXERIL) 10 MG tablet, TAKE 1/2 TO 1 (ONE-HALF TO ONE) TABLET BY MOUTH AT BEDTIME FOR  BACK  Sweeney. (Patient not taking: Reported on 02/26/2017), Disp: 30 tablet, Rfl: 0  No Known Allergies  Review of Systems Constitutional: -fever, -chills, -sweats, -unexpected weight change, -decreased appetite, -fatigue Allergy: -sneezing, -itching, -congestion Dermatology: -changing moles, --rash, -lumps ENT: -runny nose, -ear Sweeney, -sore throat, -hoarseness, -sinus Sweeney, -teeth Sweeney, - ringing in ears, -hearing loss,  -nosebleeds Cardiology: -chest Sweeney, -palpitations, -swelling, -difficulty breathing when lying flat, -waking up short of breath Respiratory: -cough, -shortness of breath, -difficulty breathing with exercise or exertion, -wheezing, -coughing up blood Gastroenterology: -abdominal Sweeney, -nausea, -vomiting, -diarrhea, -constipation, -blood in stool, -changes in bowel movement, -difficulty swallowing or eating Hematology: -bleeding, -bruising  Musculoskeletal: +joint aches, +muscle aches, -joint swelling, +occasional low back Sweeney, -neck Sweeney, -cramping, -changes in gait Ophthalmology: denies vision changes, eye redness, itching, discharge Urology: -burning with urination, -difficulty urinating, -blood in urine, -urinary frequency, -urgency, -incontinence Neurology: -headache, -weakness, -tingling, -numbness, -memory loss, -falls, -dizziness Psychology: -depressed mood, -agitation, -sleep problems     Objective:   Physical Exam  BP 128/82   Pulse 65   Ht 5' 1.5" (1.562 m)   Wt 188 lb (85.3 kg)   SpO2 96%   BMI 34.95 kg/m   General appearance: alert, no distress, WD/WN, obese AA female Skin: several keloids left and right upper shoulders posteriorly HEENT: normocephalic, conjunctiva/corneas normal, sclerae anicteric, PERRLA, EOMi, nares patent, no discharge or erythema, pharynx normal Oral cavity: MMM, tongue normal, teeth with moderate plaque Neck: supple, no lymphadenopathy, moderate goiter, no masses, normal ROM, no bruits Chest: 2 vertical linear keloids mid chest, non tender, normal shape and expansion Heart: RRR, normal S1, S2, no murmurs Lungs: CTA bilaterally, no wheezes, rhonchi, or rales Abdomen: +bs, soft, few small laparoscopic surgical scars with keloid tissue upper abdomen and umbilicus, non tender, non distended, no masses, no hepatosplenomegaly, no bruits Back: right upper paraspinal tenderness, otherwise non tender, normal ROM, no scoliosis Musculoskeletal: right shoulder  nontneder, normal ROM without much Sweeney, no swelling or deformity, upper extremities non tender, no obvious deformity, normal ROM throughout, lower extremities non tender, no obvious deformity, normal ROM throughout Extremities: no edema, no cyanosis, no clubbing Pulses: 2+ symmetric, upper and lower extremities, normal cap refill Neurological: alert, oriented x 3, CN2-12 intact, strength normal upper extremities and lower extremities, sensation normal throughout, DTRs 2+ throughout, no cerebellar signs, gait normal Psychiatric: normal affect, behavior normal, pleasant  Breast: nontender, no masses or lumps, no skin changes, no nipple discharge or inversion, no axillary lymphadenopathy Gyn/rectal-deferred     Assessment and Plan :    Encounter Diagnoses  Name Primary?  . Encounter for health maintenance examination in adult Yes  . Essential hypertension   . Goiter   . Impaired fasting blood sugar   . Impacted cerumen of both ears   . Exophthalmos   . Class 2 severe obesity with serious comorbidity in adult, unspecified BMI, unspecified obesity type ([place])   . Right shoulder Sweeney, unspecified chronicity   .  Tobacco use disorder   . Screening for breast cancer   . Vitamin D deficiency   . Right arm Sweeney      Physical exam - discussed healthy lifestyle, diet, exercise, preventative care, vaccinations, and addressed their concerns.  See your eye doctor yearly for routine vision care. See your dentist yearly for routine dental care including hygiene visits twice yearly.  There were insurance issues today, and her insuer over phone told our front office she only had coverage for phsyical exam but no labs or other testing.  I advised she call her insurer to clarify what she can and can't do.  HTN - c/t same medication  Goiter, exophthalmos - advised repeat thyroid ultrasound.  She will consider  Tobacco use - advised cessation  Advised she schedule a mammogram  Right arm Sweeney -   Advised referral to orthopedist.  obesity - work on efforts at weight loss through lifestyle changes as discussed  Advised she check insurance coverage on shingrix vaccine Declines flu vaccine.  Follow-up pending labs   Ellyce was seen today for annual exam.  Diagnoses and all orders for this visit:  Encounter for health maintenance examination in adult -     Comprehensive metabolic panel; Future -     Lipid panel; Future -     CBC with Differential/Platelet; Future -     TSH; Future -     T4, free; Future -     Hemoglobin A1c; Future  Essential hypertension  Goiter -     TSH; Future -     T4, free; Future  Impaired fasting blood sugar -     Hemoglobin A1c; Future  Impacted cerumen of both ears  Exophthalmos  Class 2 severe obesity with serious comorbidity in adult, unspecified BMI, unspecified obesity type ([facility])  Right shoulder Sweeney, unspecified chronicity -     AMB referral to orthopedics  Tobacco use disorder  Screening for breast cancer -     MM DIGITAL SCREENING BILATERAL; Future  Vitamin D deficiency  Right arm Sweeney -     AMB referral to orthopedics

## 2017-02-26 NOTE — Patient Instructions (Addendum)
  Thank you for giving me the opportunity to serve you today.    Your diagnosis today includes: Encounter Diagnoses  Name Primary?  . Encounter for health maintenance examination in adult Yes  . Essential hypertension   . Goiter   . Impaired fasting blood sugar   . Impacted cerumen of both ears   . Exophthalmos   . Class 2 severe obesity with serious comorbidity in adult, unspecified BMI, unspecified obesity type ([place])   . Right shoulder pain, unspecified chronicity   . Tobacco use disorder   . Screening for breast cancer   . Vitamin D deficiency   . Right arm pain     Recommendations:  Eat a healthy low fat diet  Exercise most days per week, at least 30 minutes or more  See your eye doctor yearly for routine vision care.  See your dentist yearly for routine dental care including hygiene visits twice yearly.  We will call with lab results  See me yearly for a physical  Quit smoking  Vaccinations  I recommend yearly flu shot  I recommend you have a Shingles Vaccine to help prevent shingles or herpes zoster outbreak.   Please call your insurer to inquire about coverage for the Shingrix vaccine given in 2 doses.   Some insurers cover this vaccine after age 41, some cover this after age 80.  If your insurer covers this, then call to schedule appointment to have this vaccine here.  I recommend "screening" labs for a phsyical   Cancer screening  I recommend you schedule a mammogram  You are up-to-date on your colonoscopy and Pap smear  thyroid goiter  I recommend having a repeat ultrasound of your neck to recheck the goiter

## 2017-02-26 NOTE — Telephone Encounter (Signed)
Pt called stating that she contacted her insurance and they will cover cover screening labs & mammogram however they will not cover any diagnostic testing.

## 2017-02-27 NOTE — Telephone Encounter (Signed)
Ok, I put labs in for fasting labs, and give her info on mammogram  Please call to schedule your mammogram.    [facility]  [phone] [address], [place], [place] [address]

## 2017-02-27 NOTE — Telephone Encounter (Signed)
Called and advised pt of Ariel Sweeney's instructions. Scheduled lab appt. Pt wants to call back at her convenience to get info about mammogram again later.

## 2017-03-02 ENCOUNTER — Other Ambulatory Visit: Payer: No Typology Code available for payment source

## 2017-03-02 DIAGNOSIS — E049 Nontoxic goiter, unspecified: Secondary | ICD-10-CM

## 2017-03-02 DIAGNOSIS — R7301 Impaired fasting glucose: Secondary | ICD-10-CM

## 2017-03-02 DIAGNOSIS — Z0000 Encounter for general adult medical examination without abnormal findings: Secondary | ICD-10-CM

## 2017-03-03 ENCOUNTER — Other Ambulatory Visit: Payer: Self-pay | Admitting: Medical

## 2017-03-03 LAB — COMPREHENSIVE METABOLIC PANEL
ALT: 28 IU/L (ref 0–32)
AST: 29 IU/L (ref 0–40)
Albumin/Globulin Ratio: 1.3 (ref 1.2–2.2)
Albumin: 4.1 g/dL (ref 3.5–5.5)
Alkaline Phosphatase: 55 IU/L (ref 39–117)
BUN/Creatinine Ratio: 16 (ref 9–23)
BUN: 21 mg/dL (ref 6–24)
Bilirubin Total: 0.2 mg/dL (ref 0.0–1.2)
CO2: 19 mmol/L — ABNORMAL LOW (ref 20–29)
Calcium: 9.5 mg/dL (ref 8.7–10.2)
Chloride: 104 mmol/L (ref 96–106)
Creatinine, Ser: 1.3 mg/dL — ABNORMAL HIGH (ref 0.57–1.00)
GFR calc Af Amer: 55 mL/min/{1.73_m2} — ABNORMAL LOW (ref >59)
GFR calc non Af Amer: 48 mL/min/{1.73_m2} — ABNORMAL LOW (ref >59)
Globulin, Total: 3.1 g/dL (ref 1.5–4.5)
Glucose: 127 mg/dL — ABNORMAL HIGH (ref 65–99)
Potassium: 4.3 mmol/L (ref 3.5–5.2)
Sodium: 145 mmol/L — ABNORMAL HIGH (ref 134–144)
Total Protein: 7.2 g/dL (ref 6.0–8.5)

## 2017-03-03 LAB — CBC WITH DIFFERENTIAL/PLATELET
Basophils Absolute: 0 10*3/uL (ref 0.0–0.2)
Basos: 1 %
EOS (ABSOLUTE): 0.3 10*3/uL (ref 0.0–0.4)
Eos: 6 %
Hematocrit: 40.3 % (ref 34.0–46.6)
Hemoglobin: 13.5 g/dL (ref 11.1–15.9)
Immature Grans (Abs): 0 10*3/uL (ref 0.0–0.1)
Immature Granulocytes: 0 %
Lymphocytes Absolute: 2.3 10*3/uL (ref 0.7–3.1)
Lymphs: 45 %
MCH: 29.7 pg (ref 26.6–33.0)
MCHC: 33.5 g/dL (ref 31.5–35.7)
MCV: 89 fL (ref 79–97)
Monocytes Absolute: 0.3 10*3/uL (ref 0.1–0.9)
Monocytes: 7 %
Neutrophils Absolute: 2.1 10*3/uL (ref 1.4–7.0)
Neutrophils: 41 %
Platelets: 274 10*3/uL (ref 150–379)
RBC: 4.55 x10E6/uL (ref 3.77–5.28)
RDW: 13.4 % (ref 12.3–15.4)
WBC: 5 10*3/uL (ref 3.4–10.8)

## 2017-03-03 LAB — LIPID PANEL
Chol/HDL Ratio: 3.8 ratio (ref 0.0–4.4)
Cholesterol, Total: 161 mg/dL (ref 100–199)
HDL: 42 mg/dL (ref >39)
LDL Calculated: 74 mg/dL (ref 0–99)
Triglycerides: 226 mg/dL — ABNORMAL HIGH (ref 0–149)
VLDL Cholesterol Cal: 45 mg/dL — ABNORMAL HIGH (ref 5–40)

## 2017-03-03 LAB — TSH: TSH: 0.671 u[IU]/mL (ref 0.450–4.500)

## 2017-03-03 LAB — HEMOGLOBIN A1C
Est. average glucose Bld gHb Est-mCnc: 128 mg/dL
Hgb A1c MFr Bld: 6.1 % — ABNORMAL HIGH (ref 4.8–5.6)

## 2017-03-03 LAB — T4, FREE: Free T4: 1.06 ng/dL (ref 0.82–1.77)

## 2017-03-03 MED ORDER — VITAMIN D (ERGOCALCIFEROL) 1.25 MG (50000 UNIT) PO CAPS: ORAL_CAPSULE | ORAL | 3 refills | Status: DC

## 2017-03-03 MED ORDER — ENALAPRIL-HYDROCHLOROTHIAZIDE 10-25 MG PO TABS: 1.0000 | ORAL_TABLET | Freq: Every day | ORAL | 3 refills | Status: DC

## 2017-03-03 MED ORDER — AMLODIPINE BESYLATE 5 MG PO TABS: 5.0000 mg | ORAL_TABLET | Freq: Every day | ORAL | 3 refills | Status: DC

## 2017-03-03 MED ORDER — ASPIRIN EC 81 MG PO TBEC: 81.0000 mg | DELAYED_RELEASE_TABLET | Freq: Every day | ORAL | 3 refills | Status: DC

## 2017-03-03 MED ORDER — PRAVASTATIN SODIUM 20 MG PO TABS: 20.0000 mg | ORAL_TABLET | Freq: Every evening | ORAL | 3 refills | Status: DC

## 2017-03-16 ENCOUNTER — Encounter (INDEPENDENT_AMBULATORY_CARE_PROVIDER_SITE_OTHER): Payer: Self-pay | Admitting: Orthopaedic Surgery

## 2017-03-16 ENCOUNTER — Ambulatory Visit (INDEPENDENT_AMBULATORY_CARE_PROVIDER_SITE_OTHER): Payer: No Typology Code available for payment source | Admitting: Orthopaedic Surgery

## 2017-03-16 VITALS — BP 131/89 | HR 74 | Resp 16 | Ht 62.0 in | Wt 188.0 lb

## 2017-03-16 DIAGNOSIS — G8929 Other chronic pain: Secondary | ICD-10-CM

## 2017-03-16 DIAGNOSIS — M25511 Pain in right shoulder: Secondary | ICD-10-CM

## 2017-03-16 MED ORDER — BUPIVACAINE HCL 0.5 % IJ SOLN
2.0000 mL | INTRAMUSCULAR | Status: AC | PRN
Start: 2017-03-16 — End: 2017-03-16
  Administered 2017-03-16: 2 mL via INTRA_ARTICULAR

## 2017-03-16 MED ORDER — METHYLPREDNISOLONE ACETATE 40 MG/ML IJ SUSP
80.0000 mg | INTRAMUSCULAR | Status: AC | PRN
  Administered 2017-03-16: 80 mg

## 2017-03-16 MED ORDER — LIDOCAINE HCL 2 % IJ SOLN
2.0000 mL | INTRAMUSCULAR | Status: AC | PRN
  Administered 2017-03-16: 2 mL

## 2017-03-16 NOTE — Progress Notes (Signed)
Office Visit Note   Patient: Ariel Sweeney           Date of Birth: 1965-05-17           MRN: [id number] Visit Date: 03/16/2017              Requested by: Carlena Hurl, PA-C [address] [place], [place] [address] PCP: Carlena Hurl, PA-C   Assessment & Plan: Visit Diagnoses:  1. Chronic right shoulder pain     Plan: Mrs. Jerline Pain relates insidious onset of right shoulder pain in 2018 without injury or trauma. 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 She did have x-rays of her shoulder in June of last year which I reviewed on the PACS system. There are some degenerative changes at the acromioclavicular joint. There is also some sclerosis about the greater tuberosity. No ectopic calcification. Humeral head is centered about the glenoid area she's had difficulty sleeping and raising her arm over her head but no numbness or tingling. No pain referred from her cervical spine. Her symptoms are consistent with impingement syndrome. We'll try a subacromial cortisone injection and monitor her response. If no improvement will consider an MRI scan. We'll plan to see her back in the next 3-4 weeks if no improvement  Follow-Up Instructions: Return if symptoms worsen or fail to improve.   Orders:  Orders Placed This Encounter  Procedures  . Large Joint Inj: R subacromial bursa   No orders of the defined types were placed in this encounter.     Procedures: Large Joint Inj: R subacromial bursa on 03/16/2017 12:23 PM Indications: pain and diagnostic evaluation Details: 25 G 1.5 in needle, anterolateral approach  Arthrogram: No  Medications: 2 mL lidocaine 2 %; 2 mL bupivacaine 0.5 %; 80 mg methylPREDNISolone acetate 40 MG/ML Consent was given by the patient. Immediately prior to procedure a time out was called to verify the correct patient, procedure, equipment, support staff and site/side marked as required. Patient was prepped and draped in the usual  sterile fashion.       Clinical Data: No additional findings.   Subjective: Chief Complaint  Patient presents with  . Right Upper Arm - Pain  . Arm Pain    Right arm pain x 6 months, no injury, weakness, limited range of motion, difficulty sleeping at night, Tylenol, IBU doesn't help, no surgery to shoulder/arm, not diabetic  Denies any skin changes. Denies pain referable to the cervical spine. No referred discomfort into either upper extremity other than into the right arm. Pain seems to be more now in the arm than in the shoulder compared to 6 months ago. No numbness or tingling.  HPI  Review of Systems  Constitutional: Negative for activity change.  HENT: Negative for trouble swallowing.   Eyes: Negative for pain.  Respiratory: Negative for shortness of breath.   Cardiovascular: Negative for leg swelling.  Gastrointestinal: Negative for constipation.  Endocrine: Negative for heat intolerance.  Genitourinary: Negative for difficulty urinating.  Musculoskeletal: Negative for neck pain.  Skin: Negative for rash.  Allergic/Immunologic: Negative for food allergies.  Neurological: Positive for weakness. Negative for numbness.  Hematological: Does not bruise/bleed easily.  Psychiatric/Behavioral: Positive for sleep disturbance.     Objective: Vital Signs: BP 131/89 (BP Location: Left Arm, Patient Position: Sitting, Cuff Size: Normal)   Pulse 74   Resp 16   Ht 5\' 2"  (1.575 m)   Wt 188 lb (85.3 kg)   BMI  34.39 kg/m   Physical Exam  Ortho Exam awake alert and oriented 3. Comfortable sitting. Positive impingement testing right shoulder on the extreme of internal/external rotation. Minimally positive empty can testing. Skin intact. Biceps intact. Negative speed sign. Good strength. Good grip. No pain at the acromioclavicular joint. Little bit of discomfort in the lateral and anterior subacromial region. No popping or clicking. No pain with range of motion of the cervical  spine  Specialty Comments:  No specialty comments available.  Imaging: No results found.   PMFS History: Patient Active Problem List   Diagnosis Date Noted  . Right arm pain 02/26/2017  . Vitamin D deficiency 07/09/2016  . Screening for cervical cancer 07/09/2016  . Pain in both thighs 06/23/2016  . Decreased pedal pulses 06/23/2016  . Encounter for health maintenance examination in adult 06/06/2015  . Tobacco use disorder 06/06/2015  . Impaired fasting blood sugar 06/06/2015  . Screening for breast cancer 06/06/2015  . Special screening for malignant neoplasms, colon 06/06/2015  . Right shoulder pain 06/06/2015  . Impacted cerumen of both ears 06/06/2015  . Essential hypertension 04/17/2014  . Goiter 04/17/2014  . Obesity 04/17/2014  . 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   Past Medical History:  Diagnosis Date  . Decreased pulse    normal ABIs 06/2016  . GERD (gastroesophageal reflux disease)    intermittent  . Goiter   . Hypertension   . Obesity   . Tobacco use   . Vitamin D deficiency   . Wears contact lenses     Family History  Problem Relation Age of Onset  . Diabetes Mother   . Hypertension Mother   . Kidney disease Mother   . Anemia Mother   . Hypertension Father   . Diabetes Father   . Heart disease Father   . Hypertension Sister   . Stroke Maternal Aunt   . Cancer Neg Hx   . Colon cancer Neg Hx     Past Surgical History:  Procedure Laterality Date  . CHOLECYSTECTOMY    . COLONOSCOPY  09/2015   tubular adenoma, repeat 5 years;  Dr. Harl Bowie  . COLPOSCOPY     history of abnormal pap in remote past  . STRABISMUS SURGERY     childhood   Social History   Occupational History  . Not on file  Tobacco Use  . Smoking status: Current Some Day Smoker    Packs/day: 0.25    Years: 26.00    Pack years: 6.50    Types: Cigarettes  . Smokeless tobacco: Never Used  Substance and Sexual Activity  . Alcohol use: Yes    Alcohol/week: 1.8 oz    Types: 1  Glasses of wine, 1 Cans of beer, 1 Shots of liquor per week    Comment: 4 weekly  . Drug use: No  . Sexual activity: Not on file

## 2017-06-01 ENCOUNTER — Ambulatory Visit: Payer: Self-pay | Admitting: Medical

## 2017-06-11 ENCOUNTER — Encounter: Payer: Self-pay | Admitting: Medical

## 2017-08-10 ENCOUNTER — Other Ambulatory Visit: Payer: Self-pay | Admitting: Medical

## 2017-08-10 NOTE — Telephone Encounter (Signed)
Waiting to hear from pt . Had to mail letter due to phone number being off. Ariel Sweeney

## 2017-08-11 ENCOUNTER — Telehealth: Payer: Self-pay | Admitting: Medical

## 2017-08-11 NOTE — Telephone Encounter (Signed)
Pt states she just spoke with you and scheduled an appt but she wanted to make sure that all her meds were going to be refilled because she is out of everything

## 2017-08-21 ENCOUNTER — Encounter: Payer: Self-pay | Admitting: Medical

## 2017-08-31 ENCOUNTER — Telehealth: Payer: Self-pay | Admitting: Medical

## 2017-08-31 ENCOUNTER — Other Ambulatory Visit: Payer: Self-pay | Admitting: Medical

## 2017-08-31 MED ORDER — POTASSIUM CHLORIDE ER 10 MEQ PO TBCR: 10.0000 meq | EXTENDED_RELEASE_TABLET | Freq: Two times a day (BID) | ORAL | 3 refills | Status: DC

## 2017-08-31 NOTE — Telephone Encounter (Signed)
Pt called and only got 30 pills which was on 2 weeks work  of her potassium she needs a refill, she would like a 90 day supply of possible, pt uses [place], [place] - [address] pt can be reached at [phone]

## 2017-09-22 ENCOUNTER — Ambulatory Visit (INDEPENDENT_AMBULATORY_CARE_PROVIDER_SITE_OTHER): Payer: No Typology Code available for payment source | Admitting: Medical

## 2017-09-22 ENCOUNTER — Encounter: Payer: Self-pay | Admitting: Medical

## 2017-09-22 VITALS — BP 136/80 | HR 74 | Temp 98.0°F | Resp 16 | Ht 62.0 in | Wt 187.8 lb

## 2017-09-22 DIAGNOSIS — H0520 Unspecified exophthalmos: Secondary | ICD-10-CM

## 2017-09-22 DIAGNOSIS — F17200 Nicotine dependence, unspecified, uncomplicated: Secondary | ICD-10-CM

## 2017-09-22 DIAGNOSIS — I10 Essential (primary) hypertension: Secondary | ICD-10-CM

## 2017-09-22 DIAGNOSIS — R7301 Impaired fasting glucose: Secondary | ICD-10-CM

## 2017-09-22 DIAGNOSIS — Z7185 Encounter for immunization safety counseling: Secondary | ICD-10-CM

## 2017-09-22 DIAGNOSIS — Z7189 Other specified counseling: Secondary | ICD-10-CM

## 2017-09-22 DIAGNOSIS — E049 Nontoxic goiter, unspecified: Secondary | ICD-10-CM | POA: Diagnosis not present

## 2017-09-22 DIAGNOSIS — Z2821 Immunization not carried out because of patient refusal: Secondary | ICD-10-CM | POA: Insufficient documentation

## 2017-09-22 DIAGNOSIS — E559 Vitamin D deficiency, unspecified: Secondary | ICD-10-CM

## 2017-09-22 DIAGNOSIS — Z1231 Encounter for screening mammogram for malignant neoplasm of breast: Secondary | ICD-10-CM

## 2017-09-22 MED ORDER — AMLODIPINE BESYLATE 10 MG PO TABS: 10.0000 mg | ORAL_TABLET | Freq: Every day | ORAL | 3 refills | Status: DC

## 2017-09-22 NOTE — Progress Notes (Signed)
Subjective: Chief Complaint  Patient presents with  . med mgmt    med check    Here for med check.  Vitamin D deficiency-compliant with 50,000 units weekly  HTN - Home BPs usually 128-130/70-80.   Not exercising  Impaired fasting glucose:  not exercising.  Diet - somewhat careful, but no major diet changes since last visit.    She declines flu shot  she has not done her mammogram yet since last visit  She has no new issues today  Past Medical History:  Diagnosis Date  . Decreased pulse    normal ABIs 06/2016  . GERD (gastroesophageal reflux disease)    intermittent  . Goiter   . Hypertension   . Obesity   . Tobacco use   . Vitamin D deficiency   . Wears contact lenses    Current Outpatient Medications on File Prior to Visit  Medication Sig Dispense Refill  . aspirin EC 81 MG tablet Take 1 tablet (81 mg total) by mouth daily. 90 tablet 3  . cyanocobalamin 500 MCG tablet Take 500 mcg by mouth every other day.    . enalapril-hydrochlorothiazide (VASERETIC) 10-25 MG tablet Take 1 tablet by mouth daily. 90 tablet 3  . Ibuprofen-Diphenhydramine Cit (ADVIL PM PO) Take by mouth.    . potassium chloride (K-DUR) 10 MEQ tablet Take 1 tablet (10 mEq total) by mouth 2 (two) times daily. 180 tablet 3  . pravastatin (PRAVACHOL) 20 MG tablet Take 1 tablet (20 mg total) by mouth every evening. 90 tablet 3  . Vitamin D, Ergocalciferol, (DRISDOL) 50000 units CAPS capsule TAKE 1 CAPSULES BY MOUTH EVERY 7 DAYS 12 capsule 3  . cyclobenzaprine (FLEXERIL) 10 MG tablet TAKE 1/2 TO 1 (ONE-HALF TO ONE) TABLET BY MOUTH AT BEDTIME FOR  BACK  PAIN. (Patient not taking: Reported on 02/26/2017) 30 tablet 0   No current facility-administered medications on file prior to visit.      Objective: BP 136/80   Pulse 74   Temp 98 F (36.7 C) (Oral)   Resp 16   Ht 5\' 2"  (1.575 m)   Wt 187 lb 12.8 oz (85.2 kg)   SpO2 96%   BMI 34.35 kg/m   Wt Readings from Last 3 Encounters:  09/22/17 187 lb 12.8 oz  (85.2 kg)  03/16/17 188 lb (85.3 kg)  02/26/17 188 lb (85.3 kg)   BP Readings from Last 3 Encounters:  09/22/17 136/80  03/16/17 131/89  02/26/17 128/82   General appearance: alert, no distress, WD/WN,  Neck: supple, no lymphadenopathy, no thyromegaly, no masses Heart: RRR, normal S1, S2, no murmurs Lungs: CTA bilaterally, no wheezes, rhonchi, or rales Abdomen: +bs, soft, non tender, non distended, no masses, no hepatomegaly, no splenomegaly Pulses: 2+ symmetric, upper and 1+ symmetric lower extremities, normal cap refill Ext: no edema      Assessment: Encounter Diagnoses  Name Primary?  . Impaired fasting blood sugar Yes  . Essential hypertension   . Goiter   . Exophthalmos   . Class 2 severe obesity with serious comorbidity in adult, unspecified BMI, unspecified obesity type ([place])   . Vitamin D deficiency   . Tobacco use disorder   . Screening mammogram, encounter for   . Vaccine counseling   . Influenza vaccination declined      Plan: Hypertension-continue enalapril HCT 10/25 mg daily.  Increase amlodipine to 10 mg daily, continue to monitor blood pressures at home  Impaired glucose-labs today, needs to do better with lifestyle  Obesity-counseled  on exercise.  Not currently exercising at all  She declines flu shot  Shingles vaccine:  I recommend you have a shingles vaccine to help prevent shingles or herpes zoster outbreak.   Please call your insurer to inquire about coverage for the Shingrix vaccine given in 2 doses.   Some insurers cover this vaccine after age 59, some cover this after age 11.  If your insurer covers this, then call to schedule appointment to have this vaccine here.  Advise she go ahead and call and schedule her mammogram as she has not done this since last visit  Tobacco use-encouraged her to quit smoking  Vitamin D deficiency-lab today, likely change to thousand units daily  Goiter, exophthalmos-reviewed labs from last visit for thyroid,  no current symptoms    Ariel Sweeney was seen today for med mgmt.  Diagnoses and all orders for this visit:  Impaired fasting blood sugar -     Hemoglobin A1c -     Microalbumin / creatinine urine ratio  Essential hypertension -     Microalbumin / creatinine urine ratio  Goiter  Exophthalmos  Class 2 severe obesity with serious comorbidity in adult, unspecified BMI, unspecified obesity type ([place])  Vitamin D deficiency -     VITAMIN D 25 Hydroxy (Vit-D Deficiency, Fractures)  Tobacco use disorder  Screening mammogram, encounter for  Vaccine counseling  Influenza vaccination declined  Other orders -     amLODipine (NORVASC) 10 MG tablet; Take 1 tablet (10 mg total) by mouth daily.

## 2017-09-22 NOTE — Patient Instructions (Signed)
Encounter Diagnoses  Name Primary?  . Impaired fasting blood sugar Yes  . Essential hypertension   . Goiter   . Exophthalmos   . Class 2 severe obesity with serious comorbidity in adult, unspecified BMI, unspecified obesity type ([place])   . Vitamin D deficiency   . Tobacco use disorder   . Screening mammogram, encounter for   . Vaccine counseling   . Influenza vaccination declined    Recommendations;  Call and schedule your mammogram  I increased Amlodipine blood pressure pill to 10mg  daily instead of 5mg  daily.  So take 2 of your 5mg  tablets daily until you run out.  The new dose will be 10mg  daily  Shingles vaccine:  I recommend you have a shingles vaccine to help prevent shingles or herpes zoster outbreak.   Please call your insurer to inquire about coverage for the Shingrix vaccine given in 2 doses.   Some insurers cover this vaccine after age 34, some cover this after age 22.  If your insurer covers this, then call to schedule appointment to have this vaccine here.  EXERCISE!  For example walk or do other exercise 30 minutes 5 days per week  Continue the rest of your medications as usual  I recommend exercising most days of the week using a type of exercise that they would enjoy and stick to such as walking, running, swimming, hiking, biking, aerobics, etc.  I recommend a healthy diet.    Do's:   whole grains such as whole grain pasta, rice, whole grains breads and whole grain cereals.  Use small quantities such as 1/2 cup per serving or 2 slices of bread per serving.    Eat 3-5 fruits daily  Eat beans at least once daily  Eat almonds in small quantities at least 3 days per week    If they eat meat, I recommend small portions of lean meats such as chicken, fish, and [place].  Eat as much NON corn and NON potato vegetables as they like, particularly raw or steamed  Drink several large glasses of water daily  Cautions:  Limit red meat  Limit corn and potatoes  Limit  sweets, cake, pie, candy  Limit beer and alcohol  Avoid fried food, fast food, large portions  Avoid sugary drinks such as regular soda and sweet tea

## 2017-09-23 ENCOUNTER — Other Ambulatory Visit: Payer: Self-pay | Admitting: Medical

## 2017-09-23 LAB — MICROALBUMIN / CREATININE URINE RATIO
Creatinine, Urine: 265.2 mg/dL
Microalb/Creat Ratio: 178.7 mg/g creat — ABNORMAL HIGH (ref 0.0–30.0)
Microalbumin, Urine: 473.8 ug/mL

## 2017-09-23 LAB — HEMOGLOBIN A1C
Est. average glucose Bld gHb Est-mCnc: 126 mg/dL
Hgb A1c MFr Bld: 6 % — ABNORMAL HIGH (ref 4.8–5.6)

## 2017-09-23 LAB — VITAMIN D 25 HYDROXY (VIT D DEFICIENCY, FRACTURES): Vit D, 25-Hydroxy: 34.8 ng/mL (ref 30.0–100.0)

## 2017-09-23 MED ORDER — VITAMIN D 1000 UNITS PO TABS: 1000.0000 [IU] | ORAL_TABLET | Freq: Every day | ORAL | 3 refills | Status: DC

## 2018-03-26 ENCOUNTER — Other Ambulatory Visit: Payer: Self-pay | Admitting: Medical

## 2018-07-01 ENCOUNTER — Other Ambulatory Visit: Payer: Self-pay | Admitting: Medical

## 2018-07-01 NOTE — Telephone Encounter (Signed)
Is this ok to refill?  

## 2018-07-05 NOTE — Telephone Encounter (Signed)
Left message on voicemail for patient to call back. 

## 2018-07-05 NOTE — Telephone Encounter (Signed)
I don't see mammogram on file, and last CPX 02/2017.   Get in for CPX, fasting labs.   Then refill meds requested.

## 2018-07-07 NOTE — Telephone Encounter (Signed)
Left message on voicemail for patient to call back to schedule CPE.

## 2018-07-09 ENCOUNTER — Other Ambulatory Visit: Payer: Self-pay | Admitting: Internal Medicine

## 2018-07-09 DIAGNOSIS — Z20828 Contact with and (suspected) exposure to other viral communicable diseases: Secondary | ICD-10-CM

## 2018-07-09 DIAGNOSIS — Z20822 Contact with and (suspected) exposure to covid-19: Secondary | ICD-10-CM

## 2018-07-12 ENCOUNTER — Other Ambulatory Visit: Payer: BLUE CROSS/BLUE SHIELD

## 2018-07-12 ENCOUNTER — Telehealth: Payer: Self-pay | Admitting: Medical

## 2018-07-12 ENCOUNTER — Other Ambulatory Visit: Payer: Self-pay | Admitting: Medical

## 2018-07-12 DIAGNOSIS — Z20828 Contact with and (suspected) exposure to other viral communicable diseases: Secondary | ICD-10-CM

## 2018-07-12 DIAGNOSIS — Z20822 Contact with and (suspected) exposure to covid-19: Secondary | ICD-10-CM

## 2018-07-12 MED ORDER — PRAVASTATIN SODIUM 20 MG PO TABS: 20.0000 mg | ORAL_TABLET | Freq: Every evening | ORAL | 0 refills | Status: DC

## 2018-07-12 MED ORDER — ENALAPRIL-HYDROCHLOROTHIAZIDE 10-25 MG PO TABS: 1.0000 | ORAL_TABLET | Freq: Every day | ORAL | 0 refills | Status: DC

## 2018-07-12 NOTE — Telephone Encounter (Signed)
Pt called and said she needs refills on Pravastatin and enalapril-Hydrochlorthiazide. Pt stated that she is completely out of these medications. She used the [facility] on [address].

## 2018-07-12 NOTE — Telephone Encounter (Signed)
meds sent, needs med check, fasting

## 2018-07-13 NOTE — Telephone Encounter (Signed)
Left message on voicemail for patient to call back to schedule fasting medcheck.

## 2018-07-13 NOTE — Telephone Encounter (Signed)
Tried to call patient and can not get through on the number.

## 2018-07-14 LAB — NOVEL CORONAVIRUS, NAA: SARS-CoV-2, NAA: NOT DETECTED

## 2018-07-15 ENCOUNTER — Telehealth: Payer: Self-pay | Admitting: *Deleted

## 2018-07-15 NOTE — Telephone Encounter (Signed)
Ariel Sweeney, [place] notified of negative COVID-19 results. Understanding verbalized.

## 2018-08-11 ENCOUNTER — Other Ambulatory Visit: Payer: Self-pay | Admitting: Medical

## 2018-08-14 DIAGNOSIS — K08109 Complete loss of teeth, unspecified cause, unspecified class: Secondary | ICD-10-CM

## 2018-08-14 HISTORY — DX: Complete loss of teeth, unspecified cause, unspecified class: K08.109

## 2018-08-17 ENCOUNTER — Ambulatory Visit (INDEPENDENT_AMBULATORY_CARE_PROVIDER_SITE_OTHER): Payer: BC Managed Care – PPO | Admitting: Medical

## 2018-08-17 ENCOUNTER — Encounter: Payer: Self-pay | Admitting: Medical

## 2018-08-17 ENCOUNTER — Other Ambulatory Visit: Payer: Self-pay

## 2018-08-17 VITALS — BP 120/82 | HR 58 | Temp 98.4°F | Resp 16 | Wt 183.6 lb

## 2018-08-17 DIAGNOSIS — E049 Nontoxic goiter, unspecified: Secondary | ICD-10-CM

## 2018-08-17 DIAGNOSIS — F17200 Nicotine dependence, unspecified, uncomplicated: Secondary | ICD-10-CM

## 2018-08-17 DIAGNOSIS — F17210 Nicotine dependence, cigarettes, uncomplicated: Secondary | ICD-10-CM

## 2018-08-17 DIAGNOSIS — R0989 Other specified symptoms and signs involving the circulatory and respiratory systems: Secondary | ICD-10-CM

## 2018-08-17 DIAGNOSIS — I10 Essential (primary) hypertension: Secondary | ICD-10-CM | POA: Diagnosis not present

## 2018-08-17 DIAGNOSIS — E66812 Obesity, class 2: Secondary | ICD-10-CM

## 2018-08-17 DIAGNOSIS — Z0000 Encounter for general adult medical examination without abnormal findings: Secondary | ICD-10-CM

## 2018-08-17 DIAGNOSIS — Z0001 Encounter for general adult medical examination with abnormal findings: Secondary | ICD-10-CM

## 2018-08-17 DIAGNOSIS — Z7189 Other specified counseling: Secondary | ICD-10-CM

## 2018-08-17 DIAGNOSIS — R7301 Impaired fasting glucose: Secondary | ICD-10-CM

## 2018-08-17 DIAGNOSIS — Z1239 Encounter for other screening for malignant neoplasm of breast: Secondary | ICD-10-CM

## 2018-08-17 DIAGNOSIS — Z7185 Encounter for immunization safety counseling: Secondary | ICD-10-CM

## 2018-08-17 DIAGNOSIS — H0520 Unspecified exophthalmos: Secondary | ICD-10-CM

## 2018-08-17 DIAGNOSIS — R1084 Generalized abdominal pain: Secondary | ICD-10-CM | POA: Diagnosis not present

## 2018-08-17 DIAGNOSIS — E559 Vitamin D deficiency, unspecified: Secondary | ICD-10-CM

## 2018-08-17 LAB — POCT URINALYSIS DIP (PROADVANTAGE DEVICE)
Bilirubin, UA: NEGATIVE
Glucose, UA: NEGATIVE mg/dL
Ketones, POC UA: NEGATIVE mg/dL
Leukocytes, UA: NEGATIVE
Nitrite, UA: NEGATIVE
Protein Ur, POC: 30 mg/dL — AB
Specific Gravity, Urine: 1.02
Urobilinogen, Ur: NEGATIVE
pH, UA: 6 (ref 5.0–8.0)

## 2018-08-17 NOTE — Progress Notes (Signed)
Subjective: Chief Complaint  Patient presents with  . med check    fasting med check, left side pain X 3 weeks   Here for physical  Here for left side x 3 weeks.  Pain is mostly all the time, but worse at night when lying moreso than when up moving around.  If she turns a certain way in bed, this may help.   No burning with urination.  Maybe some polyuria.   No blood in urine, no cloudy urine, no odor in urine.   No fever.   No nausea, no vomiting.  No diarrhea.  Ariel Sweeney has daily BM.  Has tried some tylenol.  No other aggravating or relieving factor.   Still smoking 5-6 cigarettes daily  Exercise - some, a few days per week, walking  Working full time.  No SOB, no chest pain.  compliant with medication for BP, cholesterol every day without c/o.   Checks BPs at home, usually 120-130/70-80.     Past Medical History:  Diagnosis Date  . Decreased pulse    normal ABIs 06/2016  . Full dentures 08/2018  . GERD (gastroesophageal reflux disease)    intermittent  . Goiter   . Hyperlipidemia   . Hypertension   . Obesity   . Tobacco use   . Vitamin D deficiency   . Wears contact lenses     Past Surgical History:  Procedure Laterality Date  . CHOLECYSTECTOMY    . COLONOSCOPY  09/2015   tubular adenoma, repeat 5 years;  Dr. Harl Bowie  . COLPOSCOPY     history of abnormal pap in remote past  . STRABISMUS SURGERY     childhood    Social History   Socioeconomic History  . Marital status: Single    Spouse name: Not on file  . Number of children: Not on file  . Years of education: Not on file  . Highest education level: Not on file  Occupational History  . Not on file  Social Needs  . Financial resource strain: Not on file  . Food insecurity    Worry: Not on file    Inability: Not on file  . Transportation needs    Medical: Not on file    Non-medical: Not on file  Tobacco Use  . Smoking status: Current Some Day Smoker    Packs/day: 0.25    Years: 27.00     Pack years: 6.75    Types: Cigarettes  . Smokeless tobacco: Never Used  Substance and Sexual Activity  . Alcohol use: Yes    Alcohol/week: 3.0 standard drinks    Types: 1 Glasses of wine, 1 Cans of beer, 1 Shots of liquor per week    Comment: 4 weekly  . Drug use: No  . Sexual activity: Not on file  Lifestyle  . Physical activity    Days per week: Not on file    Minutes per session: Not on file  . Stress: Not on file  Relationships  . Social Herbalist on phone: Not on file    Gets together: Not on file    Attends religious service: Not on file    Active member of club or organization: Not on file    Attends meetings of clubs or organizations: Not on file    Relationship status: Not on file  . Intimate partner violence    Fear of current or ex partner: Not on file    Emotionally abused: Not on file  Physically abused: Not on file    Forced sexual activity: Not on file  Other Topics Concern  . Not on file  Social History Narrative   Lives alone.   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 Diabetes Mother   . Hypertension Mother   . Kidney disease Mother   . Anemia Mother   . Hypertension Father   . Diabetes Father   . Heart disease Father   . Hypertension Sister   . Stroke Maternal Aunt   . Cancer Neg Hx   . Colon cancer Neg Hx      Current Outpatient Medications:  .  amLODipine (NORVASC) 10 MG tablet, Take 1 tablet (10 mg total) by mouth daily., Disp: 90 tablet, Rfl: 3 .  aspirin EC 81 MG tablet, Take 1 tablet (81 mg total) by mouth daily., Disp: 90 tablet, Rfl: 3 .  cholecalciferol (VITAMIN D) 1000 units tablet, Take 1 tablet (1,000 Units total) by mouth daily., Disp: 90 tablet, Rfl: 3 .  cyanocobalamin 500 MCG tablet, Take 500 mcg by mouth every other day., Disp: , Rfl:  .  diphenhydramine-acetaminophen (TYLENOL PM) 25-500 MG TABS tablet, Take 1  tablet by mouth at bedtime as needed., Disp: , Rfl:  .  enalapril-hydrochlorothiazide (VASERETIC) 10-25 MG tablet, Take 1 tablet by mouth once daily, Disp: 30 tablet, Rfl: 0 .  potassium chloride (K-DUR) 10 MEQ tablet, Take 1 tablet (10 mEq total) by mouth 2 (two) times daily., Disp: 180 tablet, Rfl: 3 .  pravastatin (PRAVACHOL) 20 MG tablet, TAKE 1 TABLET BY MOUTH ONCE DAILY IN THE EVENING, Disp: 30 tablet, Rfl: 0 .  Ibuprofen-Diphenhydramine Cit (ADVIL PM PO), Take by mouth., Disp: , Rfl:   Allergies  Allergen Reactions  . Influenza Vaccines     Claims illness from flu shot    Reviewed their medical, surgical, family, social, medication, and allergy history and updated chart as appropriate.   Review of Systems Constitutional: -fever, -chills, -sweats, -unexpected weight change, -decreased appetite, -fatigue Allergy: -sneezing, -itching, -congestion Dermatology: -changing moles, --rash, -lumps ENT: -runny nose, -ear pain, -sore throat, -hoarseness, -sinus pain, -teeth pain, - ringing in ears, -hearing loss, -nosebleeds Cardiology: -chest pain, -palpitations, -swelling, -difficulty breathing when lying flat, -waking up short of breath Respiratory: -cough, -shortness of breath, -difficulty breathing with exercise or exertion, -wheezing, -coughing up blood Gastroenterology: +abdominal pain, -nausea, -vomiting, -diarrhea, -constipation, -blood in stool, -changes in bowel movement, -difficulty swallowing or eating Hematology: -bleeding, -bruising  Musculoskeletal: -joint aches, -muscle aches, -joint swelling, -back pain, -neck pain, -cramping, -changes in gait Ophthalmology: denies vision changes, eye redness, itching, discharge Urology: -burning with urination, -difficulty urinating, -blood in urine, -urinary frequency, -urgency, -incontinence Neurology: -headache, -weakness, -tingling, -numbness, -memory loss, -falls, -dizziness Psychology: -depressed mood, -agitation, -sleep  problems Breast/gyn: -breast tenderness, -discharge, -lumps, -vaginal discharge,- irregular periods, -heavy periods     Objective:  BP 120/82   Pulse (!) 58   Temp 98.4 F (36.9 C) (Oral)   Resp 16   Wt 183 lb 9.6 oz (83.3 kg)   SpO2 98%   BMI 33.58 kg/m   General appearance: alert, no distress, WD/WN, obese AA female Skin: several keloids left and right upper shoulders posteriorly HEENT: normocephalic, conjunctiva/corneas normal, sclerae anicteric, PERRLA, EOMi, nares patent, no discharge or erythema, pharynx normal Oral cavity: MMM, tongue normal, full dentures Neck: supple, no lymphadenopathy, moderate goiter,  no masses, normal ROM, no bruits Chest: 2 vertical linear keloids mid chest, non tender, normal shape and expansion Heart: RRR, normal S1, S2, no murmurs Lungs: CTA bilaterally, no wheezes, rhonchi, or rales Abdomen: +bs, soft, few small laparoscopic surgical scars with keloid tissue upper abdomen and umbilicus, non tender, non distended, no masses, no hepatosplenomegaly, no bruits Back:  non tender, normal ROM, no scoliosis Musculoskeletal: normal ROM without much pain, no swelling or deformity, upper extremities non tender, no obvious deformity, normal ROM throughout, lower extremities non tender, no obvious deformity, normal ROM throughout Extremities: no edema, no cyanosis, no clubbing Pulses: 2+ symmetric, upper and lower extremities, normal cap refill Neurological: alert, oriented x 3, CN2-12 intact, strength normal upper extremities and lower extremities, sensation normal throughout, DTRs 2+ throughout, no cerebellar signs, gait normal Psychiatric: normal affect, behavior normal, pleasant  Breast: deferred Gyn/rectal-deferred   Assessment and Plan :   Encounter Diagnoses  Name Primary?  . Routine general medical examination at a health care facility Yes  . Essential hypertension   . Goiter   . Impaired fasting blood sugar   . Decreased pedal pulses   .  Exophthalmos   . Class 2 severe obesity with serious comorbidity in adult, unspecified BMI, unspecified obesity type ([place])   . Tobacco use disorder   . Vaccine counseling   . Vitamin D deficiency   . Screening for breast cancer     Physical exam - discussed and counseled on healthy lifestyle, diet, exercise, preventative care, vaccinations, sick and well care, proper use of emergency dept and after hours care, and addressed their concerns.    Health screening: Advised they see their eye doctor yearly for routine vision care. Advised they see their dentist yearly for routine dental care including hygiene visits twice yearly.   Cancer screening Counseled on self breast exams, mammograms, cervical cancer screening  Colonoscopy:  I reviewed the 2017 colonoscopy which is up-to-date  She is past due on mammogram.  I advise she do an updated mammogram.  Order put in, and she will call and schedule.  Cervical cancer screening - past due, reviewed 2016 pap that was normal.  Advised she return at her convenience for this.       Vaccinations: Advised yearly influenza vaccine  You are up to date on Tetanus vaccine  Shingles vaccine:  I recommend you have a shingles vaccine to help prevent shingles or herpes zoster outbreak.   Please call your insurer to inquire about coverage for the Shingrix vaccine given in 2 doses.   Some insurers cover this vaccine after age 28, some cover this after age 62.  If your insurer covers this, then call to schedule appointment to have this vaccine here.    Separate significant chronic issues discussed: Tobacco use  I recommend you stop smoking   High blood pressure  Continue your current medications  Your blood pressure is at goal today  Continue to monitor your blood pressure at home, goal 120/70  High cholesterol  Continue the Pravachol cholesterol medication and baby aspirin daily  We are checking labs today and we will call  results  Thyroid goiter-we are checking labs today   Decreased pulses in the legs- your 2018 screening blood flow test was normal.  We will plan to check this periodically   Vitamin D deficiency-continue a vitamin D supplement daily, continue to eat fish in the diet regularly   Work on losing weight through healthy diet and exercise   Regarding  the abdominal pain-pending labs I will likely set you up for a CT scan of your abdomen to help further evaluate this issue     Acire was seen today for med check.  Diagnoses and all orders for this visit:  Routine general medical examination at a health care facility -     POCT Urinalysis DIP (Proadvantage Device) -     Comprehensive metabolic panel -     CBC with Differential/Platelet -     Lipid panel -     Hemoglobin A1c -     Microalbumin/Creatinine Ratio, Urine -     TSH -     T4, free  Essential hypertension  Goiter -     TSH -     T4, free  Impaired fasting blood sugar -     Hemoglobin A1c  Decreased pedal pulses  Exophthalmos  Class 2 severe obesity with serious comorbidity in adult, unspecified BMI, unspecified obesity type ([facility])  Tobacco use disorder  Vaccine counseling  Vitamin D deficiency  Screening for breast cancer -     MM DIGITAL SCREENING BILATERAL; Future    Follow-up pending labs, yearly for physical

## 2018-08-17 NOTE — Patient Instructions (Signed)
Health screening: See your eye doctor yearly for routine vision care. See your dentist yearly for routine dental care including hygiene visits twice yearly.   Cancer screening Do a breast exam on your self monthly   Colonoscopy:  I reviewed the 2017 colonoscopy which is up-to-date  Your are past due on mammogram.    Please call to schedule your mammogram.   [facility]  [phone] [address] [place], [place] [address]   Cervical cancer screening - you are past due.  Return at your convenience for this.       Vaccinations: Get a yearly influenza vaccine  You are up to date on Tetanus vaccine  Shingles vaccine:  I recommend you have a shingles vaccine to help prevent shingles or herpes zoster outbreak.   Please call your insurer to inquire about coverage for the Shingrix vaccine given in 2 doses.   Some insurers cover this vaccine after age 53, some cover this after age 21.  If your insurer covers this, then call to schedule appointment to have this vaccine here.    Separate significant chronic issues discussed: Tobacco use  I recommend you stop smoking   High blood pressure  Continue your current medications  Your blood pressure is at goal today  Continue to monitor your blood pressure at home, goal 120/70  High cholesterol  Continue the Pravachol cholesterol medication and baby aspirin daily  We are checking labs today and we will call results  Thyroid goiter-we are checking labs today   Decreased pulses in the legs- your 2018 screening blood flow test was normal.  We will plan to check this periodically   Vitamin D deficiency-continue a vitamin D supplement daily, continue to eat fish in the diet regularly   Work on losing weight through healthy diet and exercise   Regarding the abdominal pain-pending labs I will likely set you up for a CT scan of your abdomen to help further evaluate this  issue     Exercise: I recommend exercising most days of the week using a type of exercise that you would enjoy and stick to such as walking, running, swimming, hiking, biking, aerobics, etc. This needs to be at least 30-40 minutes at a time, at least 5 days/week with moderate intensity.  This would be 60-70% of your maximum heart rate.  For example, your maximal heart rate would be 200- your age, multiplied x 0.6 to equal 60% of your maximal heart rate.    Thus, a person age 97 would have a maximum heart rate of 160.  60% of this would be 96 beats per minute.  Thus for fat burning exercise, a 54 year old would want to exercise has sustained heart rate of about 96 bpm for fat burning.  However for heart health, they would want to exercise at a higher heart rate of about 75% of maximum heart rate which would be a heart rate of about 120 beats per minute.  So I recommend a combination of doing some exercise at moderate intensity, and some exercise at vigorous intensity for heart health.   Low Carb Diet recommendations:  I recommend you drink water throughout the day.   70 ounces or 2 liters would be a good amount.  If you have been accustomed to drinking juice or soda, try water with lemon or water with lime, or try using no calorie flavor dropper.  I recommend the following as an example meal plan that includes  3 meals per day.   You can skip some meals periodically for intermittent fasting.  Breakfast (choose one): Mar[employer] Omelette with egg, can include a little bit of cheese, your choice of mushrooms, peppers, onions, salsa . Smoothie with handful of spinach or kale, 1 cup of milk or water, 1 cup of berries, 1 packet of artificial sweetener such as stevia . Yogurt with fruit   Mid morning snack: . 1 fruit serving and 1 protein such as 8 almonds or 8 nuts, or vegetable such as carrots and humus or other similar vegetable   Lunch: . Salad with 3-4 ounces of lean grilled or baked meat such as  fish, chicken or [place]   Mid afternoon snack: . 1 fruit serving and 1 protein such as 8 almonds or 8 nuts, or vegetable such as carrots and humus or other similar vegetable   Dinner: . Large serving of vegetables and 3-4 ounces of lean grilled or baked meat such as fish, chicken or [place] . Or vegetarian dish without meat    Avoid  . Chips, cookies, cake, donuts, soda, sweet tea, juices, candy, fast food . For now , avoid, or significantly limit grains

## 2018-08-18 ENCOUNTER — Other Ambulatory Visit: Payer: Self-pay | Admitting: Medical

## 2018-08-18 DIAGNOSIS — R1084 Generalized abdominal pain: Secondary | ICD-10-CM

## 2018-08-18 LAB — COMPREHENSIVE METABOLIC PANEL
ALT: 19 IU/L (ref 0–32)
AST: 21 IU/L (ref 0–40)
Albumin/Globulin Ratio: 1.8 (ref 1.2–2.2)
Albumin: 4.5 g/dL (ref 3.8–4.9)
Alkaline Phosphatase: 71 IU/L (ref 39–117)
BUN/Creatinine Ratio: 13 (ref 9–23)
BUN: 12 mg/dL (ref 6–24)
Bilirubin Total: 0.3 mg/dL (ref 0.0–1.2)
CO2: 22 mmol/L (ref 20–29)
Calcium: 9.6 mg/dL (ref 8.7–10.2)
Chloride: 103 mmol/L (ref 96–106)
Creatinine, Ser: 0.95 mg/dL (ref 0.57–1.00)
GFR calc Af Amer: 80 mL/min/{1.73_m2} (ref >59)
GFR calc non Af Amer: 69 mL/min/{1.73_m2} (ref >59)
Globulin, Total: 2.5 g/dL (ref 1.5–4.5)
Glucose: 117 mg/dL — ABNORMAL HIGH (ref 65–99)
Potassium: 3.6 mmol/L (ref 3.5–5.2)
Sodium: 141 mmol/L (ref 134–144)
Total Protein: 7 g/dL (ref 6.0–8.5)

## 2018-08-18 LAB — LIPID PANEL
Chol/HDL Ratio: 4.4 ratio (ref 0.0–4.4)
Cholesterol, Total: 182 mg/dL (ref 100–199)
HDL: 41 mg/dL (ref >39)
LDL Calculated: 79 mg/dL (ref 0–99)
Triglycerides: 308 mg/dL — ABNORMAL HIGH (ref 0–149)
VLDL Cholesterol Cal: 62 mg/dL — ABNORMAL HIGH (ref 5–40)

## 2018-08-18 LAB — CBC WITH DIFFERENTIAL/PLATELET
Basophils Absolute: 0 10*3/uL (ref 0.0–0.2)
Basos: 1 %
EOS (ABSOLUTE): 0.3 10*3/uL (ref 0.0–0.4)
Eos: 5 %
Hematocrit: 43.1 % (ref 34.0–46.6)
Hemoglobin: 13.6 g/dL (ref 11.1–15.9)
Immature Grans (Abs): 0 10*3/uL (ref 0.0–0.1)
Immature Granulocytes: 0 %
Lymphocytes Absolute: 2.3 10*3/uL (ref 0.7–3.1)
Lymphs: 41 %
MCH: 29 pg (ref 26.6–33.0)
MCHC: 31.6 g/dL (ref 31.5–35.7)
MCV: 92 fL (ref 79–97)
Monocytes Absolute: 0.4 10*3/uL (ref 0.1–0.9)
Monocytes: 7 %
Neutrophils Absolute: 2.7 10*3/uL (ref 1.4–7.0)
Neutrophils: 46 %
Platelets: 278 10*3/uL (ref 150–450)
RBC: 4.69 x10E6/uL (ref 3.77–5.28)
RDW: 13.4 % (ref 11.7–15.4)
WBC: 5.7 10*3/uL (ref 3.4–10.8)

## 2018-08-18 LAB — MICROALBUMIN / CREATININE URINE RATIO
Creatinine, Urine: 138.9 mg/dL
Microalb/Creat Ratio: 152 mg/g creat — ABNORMAL HIGH (ref 0–29)
Microalbumin, Urine: 211.8 ug/mL

## 2018-08-18 LAB — HEMOGLOBIN A1C
Est. average glucose Bld gHb Est-mCnc: 126 mg/dL
Hgb A1c MFr Bld: 6 % — ABNORMAL HIGH (ref 4.8–5.6)

## 2018-08-18 LAB — T4, FREE: Free T4: 1.22 ng/dL (ref 0.82–1.77)

## 2018-08-18 LAB — TSH: TSH: 1.16 u[IU]/mL (ref 0.450–4.500)

## 2018-08-18 MED ORDER — AMLODIPINE BESYLATE 10 MG PO TABS: 10.0000 mg | ORAL_TABLET | Freq: Every day | ORAL | 3 refills | Status: DC

## 2018-08-18 MED ORDER — POTASSIUM CHLORIDE ER 10 MEQ PO TBCR: 10.0000 meq | EXTENDED_RELEASE_TABLET | Freq: Two times a day (BID) | ORAL | 3 refills | Status: DC

## 2018-08-18 MED ORDER — ROSUVASTATIN CALCIUM 20 MG PO TABS: 20.0000 mg | ORAL_TABLET | Freq: Every day | ORAL | 3 refills | Status: DC

## 2018-08-18 MED ORDER — VITAMIN D 25 MCG (1000 UNIT) PO TABS: 1000.0000 [IU] | ORAL_TABLET | Freq: Every day | ORAL | 3 refills | Status: DC

## 2018-08-18 MED ORDER — ENALAPRIL-HYDROCHLOROTHIAZIDE 10-25 MG PO TABS: 1.0000 | ORAL_TABLET | Freq: Every day | ORAL | 3 refills | Status: DC

## 2018-08-18 MED ORDER — ASPIRIN EC 81 MG PO TBEC: 81.0000 mg | DELAYED_RELEASE_TABLET | Freq: Every day | ORAL | 3 refills | Status: DC

## 2018-08-19 NOTE — Addendum Note (Signed)
Addended by: Carlena Hurl on: 08/19/2018 11:31 AM   Modules accepted: Orders

## 2018-09-04 ENCOUNTER — Encounter: Payer: Self-pay | Admitting: Gastroenterology

## 2018-09-13 ENCOUNTER — Other Ambulatory Visit: Payer: Self-pay | Admitting: Medical

## 2018-09-22 ENCOUNTER — Telehealth: Payer: Self-pay | Admitting: Medical

## 2018-09-22 NOTE — Telephone Encounter (Signed)
Pt called and is stating that she is having some post nasal drip states every time that she bends over her nose starts to drip and she wants to know is there something she can take or does she need a virtual office visit pt uses [facility] [place] - [address] pt can be reached at [phone]

## 2018-09-23 NOTE — Telephone Encounter (Signed)
Have her try either FLonase nasal spray OTC or allergy tablet daily such as zyrtec or allergra without the "D"

## 2018-09-23 NOTE — Telephone Encounter (Signed)
Called and informed pt of information. °

## 2018-10-18 ENCOUNTER — Other Ambulatory Visit: Payer: Self-pay | Admitting: Medical

## 2018-12-17 ENCOUNTER — Other Ambulatory Visit: Payer: Self-pay

## 2018-12-17 DIAGNOSIS — Z20822 Contact with and (suspected) exposure to covid-19: Secondary | ICD-10-CM

## 2018-12-19 LAB — NOVEL CORONAVIRUS, NAA: SARS-CoV-2, NAA: NOT DETECTED

## 2019-03-09 ENCOUNTER — Telehealth: Payer: Self-pay | Admitting: Medical

## 2019-03-09 NOTE — Telephone Encounter (Signed)
Dismissal letter in guarantor snapshot  °

## 2019-09-08 ENCOUNTER — Encounter: Payer: Self-pay | Admitting: Medical

## 2019-09-08 ENCOUNTER — Other Ambulatory Visit: Payer: Self-pay

## 2019-09-08 ENCOUNTER — Telehealth (INDEPENDENT_AMBULATORY_CARE_PROVIDER_SITE_OTHER): Payer: BC Managed Care – PPO | Admitting: Medical

## 2019-09-08 VITALS — Ht 62.0 in | Wt 178.0 lb

## 2019-09-08 DIAGNOSIS — R5383 Other fatigue: Secondary | ICD-10-CM

## 2019-09-08 DIAGNOSIS — Z7189 Other specified counseling: Secondary | ICD-10-CM

## 2019-09-08 DIAGNOSIS — R0683 Snoring: Secondary | ICD-10-CM | POA: Insufficient documentation

## 2019-09-08 DIAGNOSIS — I10 Essential (primary) hypertension: Secondary | ICD-10-CM

## 2019-09-08 DIAGNOSIS — F17200 Nicotine dependence, unspecified, uncomplicated: Secondary | ICD-10-CM

## 2019-09-08 DIAGNOSIS — J3489 Other specified disorders of nose and nasal sinuses: Secondary | ICD-10-CM

## 2019-09-08 DIAGNOSIS — G479 Sleep disorder, unspecified: Secondary | ICD-10-CM | POA: Diagnosis not present

## 2019-09-08 DIAGNOSIS — Z7185 Encounter for immunization safety counseling: Secondary | ICD-10-CM

## 2019-09-08 DIAGNOSIS — K0889 Other specified disorders of teeth and supporting structures: Secondary | ICD-10-CM

## 2019-09-08 DIAGNOSIS — R7301 Impaired fasting glucose: Secondary | ICD-10-CM

## 2019-09-08 DIAGNOSIS — Z972 Presence of dental prosthetic device (complete) (partial): Secondary | ICD-10-CM

## 2019-09-08 NOTE — Progress Notes (Signed)
Subjective:     Patient ID: Ariel Sweeney, female   DOB: 01/14/1965, 54 y.o.   MRN: [id number]  This visit type was conducted due to national recommendations for restrictions regarding the COVID-19 Pandemic (e.g. social distancing) in an effort to limit this patient's exposure and mitigate transmission in our community.  Due to their co-morbid illnesses, this patient is at least at moderate risk for complications without adequate follow up.  This format is felt to be most appropriate for this patient at this time.    Documentation for virtual audio and video telecommunications through [facility] encounter:  The patient was located at home. The provider was located in the office. The patient did consent to this visit and is aware of possible charges through their insurance for this visit.  The other persons participating in this telemedicine service were none. Time spent on call was 20 minutes and in review of previous records 20 minutes total.  This virtual service is not related to other E/M service within previous 7 days.   HPI Chief Complaint  Patient presents with  . Nasal Congestion    nasal drip-discuss COVID Vaccine    Virtual consult for some concerns.  Has nasal drip when holding head down  Only happens when holding head down.   No runny nose though.  Has hard time sleeping.  Takes 2 tylenol and 2 benadryl nightly for sleep.  If not using sleep aids, she  tosses and turns all night.  Not sick.   No other respiratory symptoms. She has used antihistamiens without improvmenet  She snores.  No witnessed apnea, but once a few years ago had sleep study in [place].   Had sleep apnea but she couldn't sleep welll with the machine to test so not sure it was accurate.  Sometimes feels rested in the morning but not always.  Some daytime somnolence.  Feels tired sometimes.    Job has mandated her to take vaccine.  She has several concerns about safety of the Covid vaccine.  Had all teeth  extracted last year.  Has dentures but not wearing regularly.  Wearing masks helps appearing as she hasn't been wearing dentures.  Past Medical History:  Diagnosis Date  . Decreased pulse    normal ABIs 06/2016  . Full dentures 08/2018  . GERD (gastroesophageal reflux disease)    intermittent  . Goiter   . Hyperlipidemia   . Hypertension   . Obesity   . Tobacco use   . Vitamin D deficiency   . Wears contact lenses    Current Outpatient Medications on File Prior to Visit  Medication Sig Dispense Refill  . amLODipine (NORVASC) 10 MG tablet Take 1 tablet (10 mg total) by mouth daily. 90 tablet 3  . aspirin EC 81 MG tablet Take 1 tablet (81 mg total) by mouth daily. 90 tablet 3  . cholecalciferol (VITAMIN D3) 25 MCG (1000 UT) tablet Take 1 tablet (1,000 Units total) by mouth daily. 90 tablet 3  . cyanocobalamin 500 MCG tablet Take 500 mcg by mouth every other day.    . diphenhydramine-acetaminophen (TYLENOL PM) 25-500 MG TABS tablet Take 1 tablet by mouth at bedtime as needed.    . enalapril-hydrochlorothiazide (VASERETIC) 10-25 MG tablet Take 1 tablet by mouth daily. 90 tablet 3  . potassium chloride (K-DUR) 10 MEQ tablet Take 1 tablet (10 mEq total) by mouth 2 (two) times daily. 180 tablet 3  . rosuvastatin (CRESTOR) 20 MG tablet Take 1 tablet (20 mg  total) by mouth at bedtime. 90 tablet 3   No current facility-administered medications on file prior to visit.    Review of Systems As in subjective    Objective:   Physical Exam Due to coronavirus pandemic stay at home measures, patient visit was virtual and they were not examined in person.   Ht 5\' 2"  (1.575 m)   Wt 178 lb (80.7 kg)   BMI 32.56 kg/m        Assessment:     Encounter Diagnoses  Name Primary?  . Nasal drainage Yes  . Snoring   . Fatigue, unspecified type   . Sleep disturbance   . Vaccine counseling   . Essential hypertension   . Impaired fasting blood sugar   . Tobacco use disorder   . Ill-fitting  dentures        Plan:     Nasal drainage only when bending forward, not improved with antihistamine, this is a chronic issue.  Referral to ENT for further evaluation  Snoring, fatigue, sleep disturbance-we discussed the need to exercise, lose weight, eat healthy.  She could have possibly sleep apnea or hypertrophic tonsil tissue.  We will first await ENT consult and see if they think she needs sleep study versus any type of procedure  She is due for fasting labs and physical.  She will schedule soon for this  Vaccine counseling-we discussed these risks and benefits of Covid vaccines.  I strongly recommend she get the Covid vaccine.  She is a  little hesitant.  Advise she follow-up soon with clinic where she got her dentures for help in better fit  [name] was seen today for nasal congestion.  Diagnoses and all orders for this visit:  Nasal drainage -     Ambulatory referral to ENT  Snoring -     Ambulatory referral to ENT  Fatigue, unspecified type -     Ambulatory referral to ENT  Sleep disturbance -     Ambulatory referral to ENT  Vaccine counseling  Essential hypertension  Impaired fasting blood sugar  Tobacco use disorder  Ill-fitting dentures  f/u soon for fasting physical

## 2019-09-08 NOTE — Progress Notes (Signed)
Done

## 2019-09-12 ENCOUNTER — Other Ambulatory Visit: Payer: Self-pay | Admitting: Medical

## 2019-09-12 ENCOUNTER — Telehealth: Payer: Self-pay | Admitting: Medical

## 2019-09-12 MED ORDER — ROSUVASTATIN CALCIUM 20 MG PO TABS: 20.0000 mg | ORAL_TABLET | Freq: Every day | ORAL | 0 refills | Status: DC

## 2019-09-12 MED ORDER — VITAMIN D 25 MCG (1000 UNIT) PO TABS: 1000.0000 [IU] | ORAL_TABLET | Freq: Every day | ORAL | 0 refills | Status: DC

## 2019-09-12 MED ORDER — AMLODIPINE BESYLATE 10 MG PO TABS: 10.0000 mg | ORAL_TABLET | Freq: Every day | ORAL | 0 refills | Status: DC

## 2019-09-12 MED ORDER — POTASSIUM CHLORIDE ER 10 MEQ PO TBCR: 10.0000 meq | EXTENDED_RELEASE_TABLET | Freq: Two times a day (BID) | ORAL | 0 refills | Status: DC

## 2019-09-12 MED ORDER — ASPIRIN EC 81 MG PO TBEC: 81.0000 mg | DELAYED_RELEASE_TABLET | Freq: Every day | ORAL | 0 refills | Status: DC

## 2019-09-12 MED ORDER — ENALAPRIL-HYDROCHLOROTHIAZIDE 10-25 MG PO TABS: 1.0000 | ORAL_TABLET | Freq: Every day | ORAL | 0 refills | Status: DC

## 2019-09-12 NOTE — Telephone Encounter (Signed)
Come in as scheduled.  I did send off her routine medicines for refill.  I do not know if I was aware she was completely out of today or I would have refilled them then.  Nevertheless, I sent refills today

## 2019-09-12 NOTE — Telephone Encounter (Signed)
Is this ok?

## 2019-09-12 NOTE — Telephone Encounter (Signed)
Pt called and made a medcheck appt. She is completely out of all medications. Please send to [facility] [address].

## 2019-09-21 ENCOUNTER — Encounter: Payer: Self-pay | Admitting: Medical

## 2019-09-21 ENCOUNTER — Ambulatory Visit (INDEPENDENT_AMBULATORY_CARE_PROVIDER_SITE_OTHER): Payer: BC Managed Care – PPO | Admitting: Medical

## 2019-09-21 ENCOUNTER — Other Ambulatory Visit (HOSPITAL_COMMUNITY)
Admission: RE | Admit: 2019-09-21 | Discharge: 2019-09-21 | Disposition: A | Discharge status: Home / Self Care | Payer: BC Managed Care – PPO | Source: Ambulatory Visit | Attending: Medical | Admitting: Medical

## 2019-09-21 ENCOUNTER — Other Ambulatory Visit: Payer: Self-pay

## 2019-09-21 VITALS — BP 124/82 | HR 72 | Temp 98.1°F | Wt 179.6 lb

## 2019-09-21 DIAGNOSIS — Z124 Encounter for screening for malignant neoplasm of cervix: Secondary | ICD-10-CM | POA: Diagnosis not present

## 2019-09-21 DIAGNOSIS — I10 Essential (primary) hypertension: Secondary | ICD-10-CM | POA: Diagnosis not present

## 2019-09-21 DIAGNOSIS — R7301 Impaired fasting glucose: Secondary | ICD-10-CM | POA: Diagnosis not present

## 2019-09-21 DIAGNOSIS — E785 Hyperlipidemia, unspecified: Secondary | ICD-10-CM

## 2019-09-21 DIAGNOSIS — E559 Vitamin D deficiency, unspecified: Secondary | ICD-10-CM

## 2019-09-21 DIAGNOSIS — R5383 Other fatigue: Secondary | ICD-10-CM

## 2019-09-21 DIAGNOSIS — Z0000 Encounter for general adult medical examination without abnormal findings: Secondary | ICD-10-CM

## 2019-09-21 DIAGNOSIS — E049 Nontoxic goiter, unspecified: Secondary | ICD-10-CM

## 2019-09-21 DIAGNOSIS — R0989 Other specified symptoms and signs involving the circulatory and respiratory systems: Secondary | ICD-10-CM

## 2019-09-21 DIAGNOSIS — Z1231 Encounter for screening mammogram for malignant neoplasm of breast: Secondary | ICD-10-CM

## 2019-09-21 DIAGNOSIS — J3489 Other specified disorders of nose and nasal sinuses: Secondary | ICD-10-CM

## 2019-09-21 DIAGNOSIS — Z972 Presence of dental prosthetic device (complete) (partial): Secondary | ICD-10-CM

## 2019-09-21 DIAGNOSIS — R0683 Snoring: Secondary | ICD-10-CM

## 2019-09-21 DIAGNOSIS — F17200 Nicotine dependence, unspecified, uncomplicated: Secondary | ICD-10-CM | POA: Diagnosis not present

## 2019-09-21 DIAGNOSIS — K0889 Other specified disorders of teeth and supporting structures: Secondary | ICD-10-CM

## 2019-09-21 DIAGNOSIS — H0520 Unspecified exophthalmos: Secondary | ICD-10-CM | POA: Diagnosis not present

## 2019-09-21 DIAGNOSIS — Z7189 Other specified counseling: Secondary | ICD-10-CM

## 2019-09-21 DIAGNOSIS — Z7185 Encounter for immunization safety counseling: Secondary | ICD-10-CM

## 2019-09-21 DIAGNOSIS — G479 Sleep disorder, unspecified: Secondary | ICD-10-CM

## 2019-09-21 NOTE — Progress Notes (Signed)
Subjective:   HPI  Ariel Sweeney is a 54 y.o. female who presents for Chief Complaint  Patient presents with  . other    med check still has nasal drip    Patient Care Team: Adrienna Karis, Camelia Eng, PA-C as PCP - General (Family Medicine) Sees dentist Sees eye doctor Dr. Harl Bowie, GI  Concerns: I saw her recently for drippy nose, snoring, and concerns about vaccines. (See recent visit notes)  No new complaints  She notes compliance with her medications including enalapril HCT blood pressure pill, Crestor cholesterol medicine, Klor-Con, amlodipine, aspirin, vitamin D supplement  Gynecological history: 2 pregnancies, 1 live births, 1 miscarriage  Past Medical History:  Diagnosis Date  . Decreased pulse    normal ABIs 06/2016  . Full dentures 08/2018  . GERD (gastroesophageal reflux disease)    intermittent  . Goiter   . Hyperlipidemia   . Hypertension   . Obesity   . Tobacco use   . Vitamin D deficiency   . Wears contact lenses     Past Surgical History:  Procedure Laterality Date  . CHOLECYSTECTOMY    . COLONOSCOPY  09/2015   tubular adenoma, repeat 5 years;  Dr. Harl Bowie  . COLPOSCOPY     history of abnormal pap in remote past  . STRABISMUS SURGERY     childhood    Family History  Problem Relation Age of Onset  . Diabetes Mother   . Hypertension Mother   . Kidney disease Mother   . Anemia Mother   . Hypertension Father   . Diabetes Father   . Heart disease Father   . Hypertension Sister   . Stroke Maternal Aunt   . Cancer Neg Hx   . Colon cancer Neg Hx      Current Outpatient Medications:  .  amLODipine (NORVASC) 10 MG tablet, Take 1 tablet (10 mg total) by mouth daily., Disp: 90 tablet, Rfl: 0 .  aspirin EC 81 MG tablet, Take 1 tablet (81 mg total) by mouth daily., Disp: 90 tablet, Rfl: 0 .  cholecalciferol (VITAMIN D3) 25 MCG (1000 UNIT) tablet, Take 1 tablet (1,000 Units total) by mouth daily., Disp: 90 tablet, Rfl: 0 .   cyanocobalamin 500 MCG tablet, Take 500 mcg by mouth every other day., Disp: , Rfl:  .  diphenhydramine-acetaminophen (TYLENOL PM) 25-500 MG TABS tablet, Take 1 tablet by mouth at bedtime as needed., Disp: , Rfl:  .  enalapril-hydrochlorothiazide (VASERETIC) 10-25 MG tablet, Take 1 tablet by mouth daily., Disp: 90 tablet, Rfl: 0 .  potassium chloride (KLOR-CON) 10 MEQ tablet, Take 1 tablet (10 mEq total) by mouth 2 (two) times daily., Disp: 180 tablet, Rfl: 0 .  rosuvastatin (CRESTOR) 20 MG tablet, Take 1 tablet (20 mg total) by mouth at bedtime., Disp: 90 tablet, Rfl: 0  Allergies  Allergen Reactions  . Influenza Vaccines     Claims illness from flu shot    Reviewed their medical, surgical, family, social, medication, and allergy history and updated chart as appropriate.   Review of Systems Constitutional: -fever, -chills, -sweats, -unexpected weight change, -decreased appetite, -fatigue Allergy: -sneezing, -itching, -congestion Dermatology: -changing moles, --rash, -lumps ENT: -runny nose, -ear pain, -sore throat, -hoarseness, -sinus pain, -teeth pain, - ringing in ears, -hearing loss, -nosebleeds Cardiology: -chest pain, -palpitations, -swelling, -difficulty breathing when lying flat, -waking up short of breath Respiratory: -cough, -shortness of breath, -difficulty breathing with exercise or exertion, -wheezing, -coughing up blood Gastroenterology: -abdominal pain, -nausea, -vomiting, -diarrhea, -constipation, -  blood in stool, -changes in bowel movement, -difficulty swallowing or eating Hematology: -bleeding, -bruising  Musculoskeletal: -joint aches, -muscle aches, -joint swelling, -back pain, -neck pain, -cramping, -changes in gait Ophthalmology: denies vision changes, eye redness, itching, discharge Urology: -burning with urination, -difficulty urinating, -blood in urine, -urinary frequency, -urgency, -incontinence Neurology: -headache, -weakness, -tingling, -numbness, -memory loss,  -falls, -dizziness Psychology: -depressed mood, -agitation, -sleep problems Breast/gyn: -breast tendnerss, -discharge, -lumps, -vaginal discharge,- irregular periods, -heavy periods     Objective:  BP 124/82   Pulse 72   Temp 98.1 F (36.7 C)   Wt 179 lb 9.6 oz (81.5 kg)   BMI 32.85 kg/m   General appearance: alert, no distress, WD/WN, African American female Skin: Keloid scars on right deltoid mid upper chest, brownish coloration on the medial inferior portion of bilateral breast from likely recent fungal tinea infection/intertrigo, no worrisome lesion HEENT: normocephalic, conjunctiva/corneas normal, sclerae anicteric, exophthalmos noted  PERRLA, EOMi, nares with turbinate edema moderately both sides, some dryness on the left, otherwise unremarkable, no discharge or erythema, pharynx normal Oral cavity: MMM, tongue normal, teeth normal Neck: supple, no lymphadenopathy, no thyromegaly, no masses, normal ROM, no bruits Chest: non tender, normal shape and expansion Heart: RRR, normal S1, S2, no murmurs Lungs: CTA bilaterally, no wheezes, rhonchi, or rales Abdomen: Port surgical scars noted of the abdomen, +bs, soft, non tender, non distended, no masses, no hepatomegaly, no splenomegaly, no bruits Back: non tender, normal ROM, no scoliosis Musculoskeletal: upper extremities non tender, no obvious deformity, normal ROM throughout, lower extremities non tender, no obvious deformity, normal ROM throughout Extremities: no edema, no cyanosis, no clubbing Pulses: 2+ symmetric, upper and lower extremities, normal cap refill Neurological: alert, oriented x 3, CN2-12 intact, strength normal upper extremities and lower extremities, sensation normal throughout, DTRs 2+ throughout, no cerebellar signs, gait normal Psychiatric: normal affect, behavior normal, pleasant  Breast: nontender, no masses or lumps, no skin changes, no nipple discharge or inversion, no axillary lymphadenopathy Gyn: Normal  external genitalia without lesions, vagina with normal mucosa, cervix without lesions, no cervical motion tenderness, no abnormal vaginal discharge.  Uterus and adnexa not enlarged, nontender, no masses.   Pap performed.  Exam chaperoned by nurse. Rectal: Anus with fleshy skin tag moderate sized    Assessment and Plan :   Encounter Diagnoses  Name Primary?  . Routine general medical examination at a health care facility Yes  . Tobacco use disorder   . Exophthalmos   . Class 2 severe obesity with serious comorbidity in adult, unspecified BMI, unspecified obesity type ([place])   . Goiter   . Impaired fasting blood sugar   . Encounter for screening mammogram for malignant neoplasm of breast   . Vitamin D deficiency   . Decreased pedal pulses   . Screening for cervical cancer   . Vaccine counseling   . Snoring   . Sleep disturbance   . Fatigue, unspecified type   . Ill-fitting dentures   . Essential hypertension   . Hyperlipidemia, unspecified hyperlipidemia type   . Nasal drainage     Physical exam - discussed and counseled on healthy lifestyle, diet, exercise, preventative care, vaccinations, sick and well care, proper use of emergency dept and after hours care, and addressed their concerns.    Health screening: Advised they see their eye doctor yearly for routine vision care. Advised they see their dentist yearly for routine dental care including hygiene visits twice yearly.   Cancer screening Counseled on self breast exams, mammograms, cervical  cancer screening  Advise she schedule mammogram soon  Pap smear sent  Due for colonoscopy next year   Vaccines:  You are up-to-date on pneumonia vaccine  You are up-to-date on tetanus vaccine  I recommend the Covid vaccine  I recommend yearly flu shot  I recommend the shingles vaccine  Please call your insurance to make sure they cover the shingles vaccine and let us plan to do this   Other specific  issues: Hypertension-continue current medications, labs today  hyperlipidemia-continue current medication, labs today  nasal drainage-begin Flonase over-the-counter.  She failed oral antihistamines and nasal saline flushes. Refer for ENT consult given that she has failed several treatments for nasal drainage symptom  ill fitting dentures-advise she follow-up with dental clinic where she had dentures made  snoring-depending on ENT consult, possibly get sleep study  ABI normal 06/2016  Sarahanne was seen today for other.  Diagnoses and all orders for this visit:  Routine general medical examination at a health care facility -     Comprehensive metabolic panel -     CBC -     Lipid panel -     Microalbumin/Creatinine Ratio, Urine -     TSH -     T4, free -     Hemoglobin A1c -     MM DIGITAL SCREENING BILATERAL; Future  Tobacco use disorder  Exophthalmos  Class 2 severe obesity with serious comorbidity in adult, unspecified BMI, unspecified obesity type ([place])  Goiter -     TSH -     T4, free  Impaired fasting blood sugar -     Microalbumin/Creatinine Ratio, Urine -     Hemoglobin A1c  Encounter for screening mammogram for malignant neoplasm of breast -     MM DIGITAL SCREENING BILATERAL; Future  Vitamin D deficiency  Decreased pedal pulses  Screening for cervical cancer -     Cytology - PAP([place])  Vaccine counseling  Snoring -     Ambulatory referral to ENT  Sleep disturbance -     Ambulatory referral to ENT  Fatigue, unspecified type -     Ambulatory referral to ENT  Ill-fitting dentures  Essential hypertension -     Microalbumin/Creatinine Ratio, Urine  Hyperlipidemia, unspecified hyperlipidemia type -     Lipid panel  Nasal drainage -     Ambulatory referral to ENT   Follow-up pending labs, yearly for physical

## 2019-09-22 ENCOUNTER — Other Ambulatory Visit: Payer: Self-pay | Admitting: Medical

## 2019-09-22 LAB — HEMOGLOBIN A1C
Est. average glucose Bld gHb Est-mCnc: 131 mg/dL
Hgb A1c MFr Bld: 6.2 % — ABNORMAL HIGH (ref 4.8–5.6)

## 2019-09-22 LAB — CBC
Hematocrit: 41 % (ref 34.0–46.6)
Hemoglobin: 13.5 g/dL (ref 11.1–15.9)
MCH: 28.7 pg (ref 26.6–33.0)
MCHC: 32.9 g/dL (ref 31.5–35.7)
MCV: 87 fL (ref 79–97)
Platelets: 271 10*3/uL (ref 150–450)
RBC: 4.71 x10E6/uL (ref 3.77–5.28)
RDW: 13.6 % (ref 11.7–15.4)
WBC: 4.3 10*3/uL (ref 3.4–10.8)

## 2019-09-22 LAB — COMPREHENSIVE METABOLIC PANEL
ALT: 30 IU/L (ref 0–32)
AST: 28 IU/L (ref 0–40)
Albumin/Globulin Ratio: 1.6 (ref 1.2–2.2)
Albumin: 4.4 g/dL (ref 3.8–4.9)
Alkaline Phosphatase: 75 IU/L (ref 48–121)
BUN/Creatinine Ratio: 15 (ref 9–23)
BUN: 15 mg/dL (ref 6–24)
Bilirubin Total: 0.2 mg/dL (ref 0.0–1.2)
CO2: 23 mmol/L (ref 20–29)
Calcium: 9.4 mg/dL (ref 8.7–10.2)
Chloride: 102 mmol/L (ref 96–106)
Creatinine, Ser: 0.98 mg/dL (ref 0.57–1.00)
GFR calc Af Amer: 76 mL/min/{1.73_m2} (ref >59)
GFR calc non Af Amer: 66 mL/min/{1.73_m2} (ref >59)
Globulin, Total: 2.7 g/dL (ref 1.5–4.5)
Glucose: 98 mg/dL (ref 65–99)
Potassium: 3.7 mmol/L (ref 3.5–5.2)
Sodium: 142 mmol/L (ref 134–144)
Total Protein: 7.1 g/dL (ref 6.0–8.5)

## 2019-09-22 LAB — LIPID PANEL
Chol/HDL Ratio: 2.9 ratio (ref 0.0–4.4)
Cholesterol, Total: 145 mg/dL (ref 100–199)
HDL: 50 mg/dL (ref >39)
LDL Chol Calc (NIH): 65 mg/dL (ref 0–99)
Triglycerides: 178 mg/dL — ABNORMAL HIGH (ref 0–149)
VLDL Cholesterol Cal: 30 mg/dL (ref 5–40)

## 2019-09-22 LAB — TSH: TSH: 0.929 u[IU]/mL (ref 0.450–4.500)

## 2019-09-22 LAB — MICROALBUMIN / CREATININE URINE RATIO
Creatinine, Urine: 102.7 mg/dL
Microalb/Creat Ratio: 127 mg/g creat — ABNORMAL HIGH (ref 0–29)
Microalbumin, Urine: 130.9 ug/mL

## 2019-09-22 LAB — T4, FREE: Free T4: 1.1 ng/dL (ref 0.82–1.77)

## 2019-09-26 LAB — CYTOLOGY - PAP
Adequacy: ABSENT
Comment: NEGATIVE
Diagnosis: NEGATIVE
High risk HPV: NEGATIVE

## 2019-10-06 ENCOUNTER — Ambulatory Visit: Payer: BLUE CROSS/BLUE SHIELD

## 2019-10-24 ENCOUNTER — Ambulatory Visit
Admission: RE | Admit: 2019-10-24 | Discharge: 2019-10-24 | Disposition: A | Discharge status: Home / Self Care | Payer: BC Managed Care – PPO | Source: Ambulatory Visit | Attending: Medical | Admitting: Medical

## 2019-10-24 ENCOUNTER — Other Ambulatory Visit: Payer: Self-pay

## 2019-10-24 DIAGNOSIS — Z1231 Encounter for screening mammogram for malignant neoplasm of breast: Secondary | ICD-10-CM

## 2019-10-24 DIAGNOSIS — Z0000 Encounter for general adult medical examination without abnormal findings: Secondary | ICD-10-CM

## 2019-11-18 ENCOUNTER — Other Ambulatory Visit: Payer: Self-pay | Admitting: Medical

## 2019-11-18 DIAGNOSIS — R928 Other abnormal and inconclusive findings on diagnostic imaging of breast: Secondary | ICD-10-CM

## 2019-12-13 ENCOUNTER — Other Ambulatory Visit: Payer: Self-pay | Admitting: Medical

## 2020-03-26 ENCOUNTER — Other Ambulatory Visit: Payer: Self-pay | Admitting: Medical

## 2020-04-27 ENCOUNTER — Other Ambulatory Visit: Payer: Self-pay | Admitting: Medical

## 2020-05-02 ENCOUNTER — Other Ambulatory Visit: Payer: Self-pay

## 2020-05-02 ENCOUNTER — Telehealth: Payer: Self-pay | Admitting: Medical

## 2020-05-02 MED ORDER — ROSUVASTATIN CALCIUM 20 MG PO TABS: 20.0000 mg | ORAL_TABLET | Freq: Every day | ORAL | 0 refills | Status: DC

## 2020-05-02 MED ORDER — AMLODIPINE BESYLATE 10 MG PO TABS: 1.0000 | ORAL_TABLET | Freq: Every day | ORAL | 0 refills | Status: DC

## 2020-05-02 MED ORDER — POTASSIUM CHLORIDE ER 10 MEQ PO TBCR: 10.0000 meq | EXTENDED_RELEASE_TABLET | Freq: Two times a day (BID) | ORAL | 0 refills | Status: DC

## 2020-05-02 MED ORDER — ASPIRIN 81 MG PO TBEC: 81.0000 mg | DELAYED_RELEASE_TABLET | Freq: Every day | ORAL | 0 refills | Status: DC

## 2020-05-02 MED ORDER — ENALAPRIL-HYDROCHLOROTHIAZIDE 10-25 MG PO TABS: 1.0000 | ORAL_TABLET | Freq: Every day | ORAL | 0 refills | Status: DC

## 2020-05-02 NOTE — Telephone Encounter (Signed)
Pt called and made a medcheck appt for may 18th she would like a refill of 30 days for aspririn, potassim, amlodipine enalapril Please send to the [facility] [place] - [address]

## 2020-05-02 NOTE — Telephone Encounter (Signed)
Medications have been sent to the pharmacy for a 30 day supply.

## 2020-05-30 ENCOUNTER — Ambulatory Visit (INDEPENDENT_AMBULATORY_CARE_PROVIDER_SITE_OTHER): Payer: Managed Care, Other (non HMO) | Admitting: Medical

## 2020-05-30 ENCOUNTER — Encounter: Payer: Self-pay | Admitting: Medical

## 2020-05-30 ENCOUNTER — Other Ambulatory Visit: Payer: Self-pay

## 2020-05-30 VITALS — BP 118/82 | HR 69 | Ht 62.0 in | Wt 185.0 lb

## 2020-05-30 DIAGNOSIS — G479 Sleep disorder, unspecified: Secondary | ICD-10-CM

## 2020-05-30 DIAGNOSIS — I10 Essential (primary) hypertension: Secondary | ICD-10-CM

## 2020-05-30 DIAGNOSIS — Z1211 Encounter for screening for malignant neoplasm of colon: Secondary | ICD-10-CM | POA: Diagnosis not present

## 2020-05-30 DIAGNOSIS — Z23 Encounter for immunization: Secondary | ICD-10-CM | POA: Diagnosis not present

## 2020-05-30 DIAGNOSIS — Z7185 Encounter for immunization safety counseling: Secondary | ICD-10-CM

## 2020-05-30 DIAGNOSIS — E559 Vitamin D deficiency, unspecified: Secondary | ICD-10-CM | POA: Diagnosis not present

## 2020-05-30 DIAGNOSIS — E049 Nontoxic goiter, unspecified: Secondary | ICD-10-CM

## 2020-05-30 DIAGNOSIS — R7301 Impaired fasting glucose: Secondary | ICD-10-CM | POA: Diagnosis not present

## 2020-05-30 MED ORDER — ENALAPRIL-HYDROCHLOROTHIAZIDE 10-25 MG PO TABS: 1.0000 | ORAL_TABLET | Freq: Every day | ORAL | 3 refills | Status: DC

## 2020-05-30 MED ORDER — POTASSIUM CHLORIDE ER 10 MEQ PO TBCR: 10.0000 meq | EXTENDED_RELEASE_TABLET | Freq: Two times a day (BID) | ORAL | 3 refills | Status: DC

## 2020-05-30 MED ORDER — AMLODIPINE BESYLATE 10 MG PO TABS: 1.0000 | ORAL_TABLET | Freq: Every day | ORAL | 3 refills | Status: DC

## 2020-05-30 MED ORDER — ASPIRIN 81 MG PO TBEC: 81.0000 mg | DELAYED_RELEASE_TABLET | Freq: Every day | ORAL | 3 refills | Status: DC

## 2020-05-30 MED ORDER — ROSUVASTATIN CALCIUM 20 MG PO TABS: 20.0000 mg | ORAL_TABLET | Freq: Every day | ORAL | 3 refills | Status: DC

## 2020-05-30 NOTE — Progress Notes (Signed)
Subjective:  Ariel Sweeney is a 55 y.o. female who presents for Chief Complaint  Patient presents with  . Medication Management    Still having trouble sleeping      Here for med check.  Last visit with me was September 2021 for physical  Hyperlipidemia-compliant with statin without complaint  She is taking her vitamin D supplement regularly  Hypertension-compliant with medications, no worrisome symptoms  She does continue to smoke  Her main concern is ongoing sleep issues.  She notes sleep study in the remote past in [place] but says she never went to sleep so she does not know how they can diagnose her with sleep apnea without actually going to sleep on the study  She currently has trouble getting to sleep and staying asleep despite treatment.  She uses 2 Tylenol PM daily over-the-counter which helps some  Last visit we referred to ear nose and throat due to significant nasal drainage despite medication and a sleep concern.  They required $500 cash upfront and she declined to see them  She is not currently exercising  No other aggravating or relieving factors.    No other c/o.  Past Medical History:  Diagnosis Date  . Decreased pulse    normal ABIs 06/2016  . Full dentures 08/2018  . GERD (gastroesophageal reflux disease)    intermittent  . Goiter   . Hyperlipidemia   . Hypertension   . Obesity   . Tobacco use   . Vitamin D deficiency   . Wears contact lenses    Current Outpatient Medications on File Prior to Visit  Medication Sig Dispense Refill  . amLODipine (NORVASC) 10 MG tablet Take 1 tablet (10 mg total) by mouth daily. 30 tablet 0  . aspirin (EQ ASPIRIN ADULT LOW DOSE) 81 MG EC tablet Take 1 tablet (81 mg total) by mouth daily. Swallow whole. 30 tablet 0  . cholecalciferol (VITAMIN D3) 25 MCG (1000 UNIT) tablet Take 1 tablet (1,000 Units total) by mouth daily. 90 tablet 0  . cyanocobalamin 500 MCG tablet Take 500 mcg by mouth every other day.    .  diphenhydramine-acetaminophen (TYLENOL PM) 25-500 MG TABS tablet Take 1 tablet by mouth at bedtime as needed.    . enalapril-hydrochlorothiazide (VASERETIC) 10-25 MG tablet Take 1 tablet by mouth daily. 30 tablet 0  . potassium chloride (KLOR-CON) 10 MEQ tablet Take 1 tablet (10 mEq total) by mouth 2 (two) times daily. 60 tablet 0  . rosuvastatin (CRESTOR) 20 MG tablet Take 1 tablet (20 mg total) by mouth at bedtime. 90 tablet 0   No current facility-administered medications on file prior to visit.     The following portions of the patient's history were reviewed and updated as appropriate: allergies, current medications, past family history, past medical history, past social history, past surgical history and problem list.  ROS Otherwise as in subjective above  Objective: BP 118/82   Pulse 69   Ht 5\' 2"  (1.575 m)   Wt 185 lb (83.9 kg)   SpO2 96%   BMI 33.84 kg/m   General appearance: alert, no distress, well developed, well nourished HEENT: normocephalic, sclerae anicteric, conjunctiva pink and moist, TMs pearly, nares with some turbinate hypertrophy but no drainage or swelling today,, no discharge or erythema, pharynx normal Oral cavity: MMM, no lesions Neck: supple, no lymphadenopathy, + goiter, no other specific masses Heart: RRR, normal S1, S2, no murmurs Lungs: CTA bilaterally, no wheezes, rhonchi, or rales Pulses: 2+ radial pulses,  2+ pedal pulses, normal cap refill Ext: no edema   Assessment: Encounter Diagnoses  Name Primary?  . Impaired fasting blood sugar Yes  . Colon cancer screening   . Need for pneumococcal vaccination   . Vitamin D deficiency   . Sleep disturbance   . Vaccine counseling   . Thyroid goiter   . Essential hypertension      Plan: Impaired glucose-counseled on diet and exercise.  She needs to do better.  Not currently doing much in the way of lifestyle changes to help.  Repeat A1c today.  Referral for updated colon cancer  screening  Vitamin D deficiency-compliant with supplement, lab update today  Sleep disturbance-referral to neurology for formal sleep evaluation  Counseled on the pneumococcal vaccine.  Vaccine information sheet given.  Pneumococcal vaccine PPSV23 given after consent obtained.  Hypertension-continue current medication  Salah was seen today for medication management.  Diagnoses and all orders for this visit:  Impaired fasting blood sugar -     Hemoglobin A1c  Colon cancer screening -     Ambulatory referral to Gastroenterology  Need for pneumococcal vaccination  Vitamin D deficiency -     VITAMIN D 25 Hydroxy (Vit-D Deficiency, Fractures)  Sleep disturbance -     Ambulatory referral to Neurology  Vaccine counseling  Thyroid goiter  Essential hypertension  Other orders -     Pneumococcal polysaccharide vaccine 23-valent greater than or equal to 2yo subcutaneous/IM    Follow up: pending labs, referrals

## 2020-05-31 ENCOUNTER — Other Ambulatory Visit: Payer: Self-pay | Admitting: Medical

## 2020-05-31 LAB — HEMOGLOBIN A1C
Est. average glucose Bld gHb Est-mCnc: 146 mg/dL
Hgb A1c MFr Bld: 6.7 % — ABNORMAL HIGH (ref 4.8–5.6)

## 2020-05-31 LAB — VITAMIN D 25 HYDROXY (VIT D DEFICIENCY, FRACTURES): Vit D, 25-Hydroxy: 26.3 ng/mL — ABNORMAL LOW (ref 30.0–100.0)

## 2020-05-31 MED ORDER — VITAMIN D 25 MCG (1000 UNIT) PO TABS: 2000.0000 [IU] | ORAL_TABLET | Freq: Every day | ORAL | 3 refills | Status: DC

## 2020-10-04 ENCOUNTER — Other Ambulatory Visit: Payer: Self-pay | Admitting: Medical

## 2020-12-31 ENCOUNTER — Telehealth: Payer: Self-pay | Admitting: Medical

## 2020-12-31 MED ORDER — ROSUVASTATIN CALCIUM 20 MG PO TABS: 20.0000 mg | ORAL_TABLET | Freq: Every day | ORAL | 0 refills | Status: DC

## 2020-12-31 NOTE — Telephone Encounter (Signed)
done

## 2020-12-31 NOTE — Telephone Encounter (Signed)
Pt is requesting a refill on her crestor please send to the [facility]

## 2021-04-01 IMAGING — MG DIGITAL SCREENING BILAT W/ CAD
5 series · 5 of 5 positions shown · non-contrast
Comparison: None.
COMPARISON: None.

Addendum:
CLINICAL DATA: Screening.

EXAM:
DIGITAL SCREENING BILATERAL MAMMOGRAM WITH CAD

[L CC]
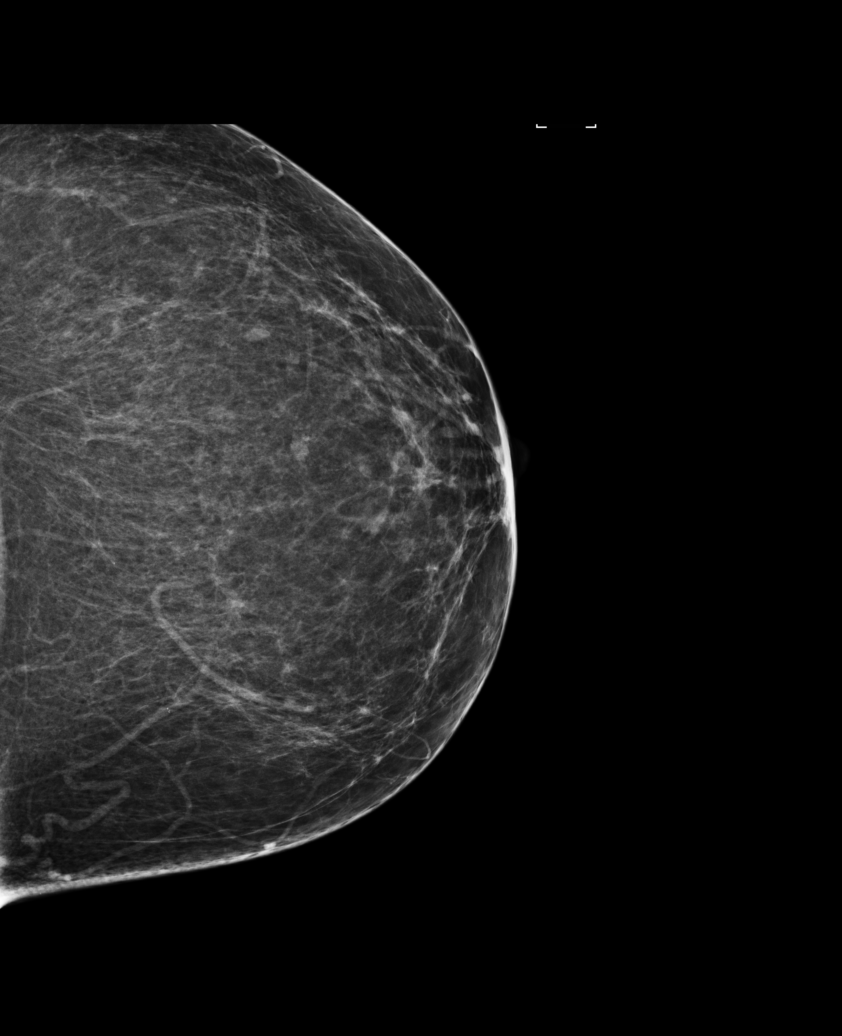

[R CC]
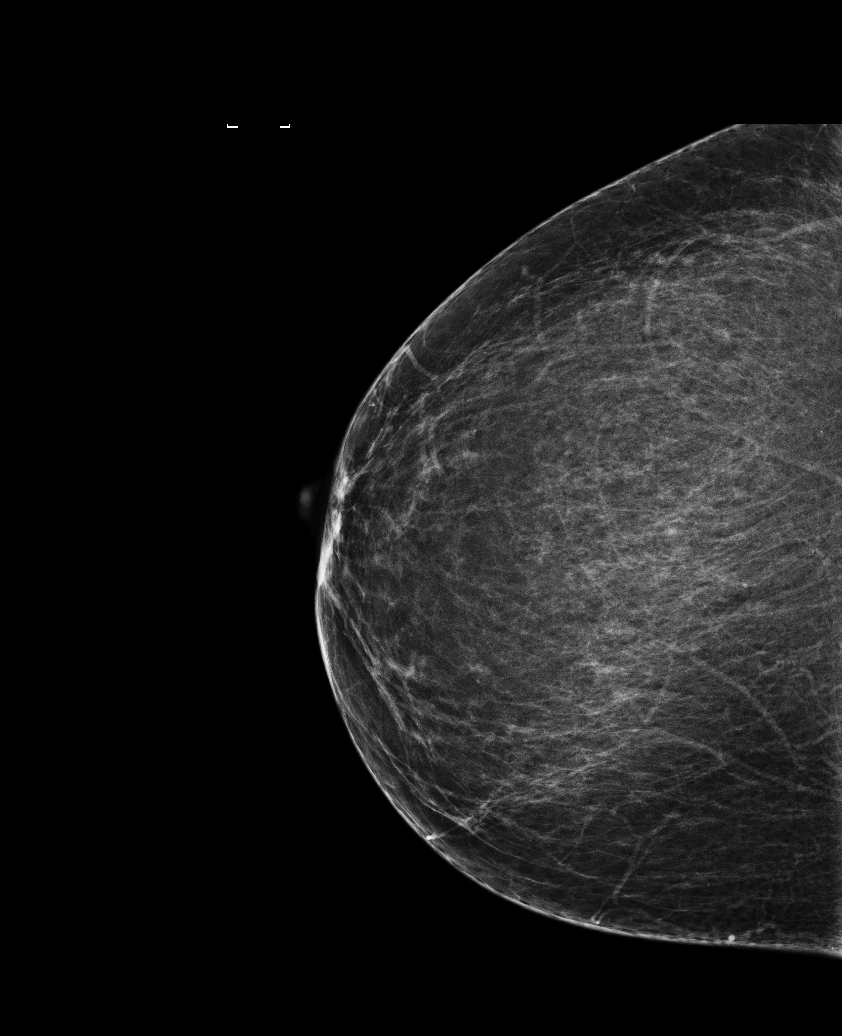

[R MLO (1 of 2)]
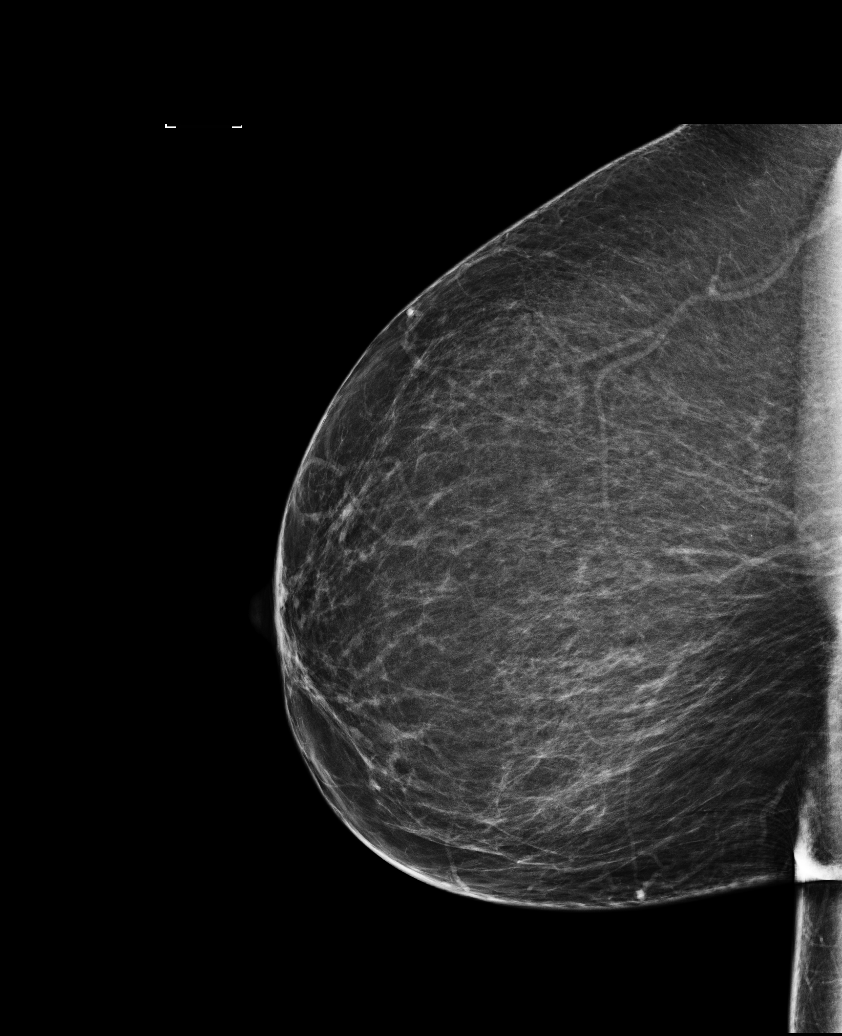

[L MLO]
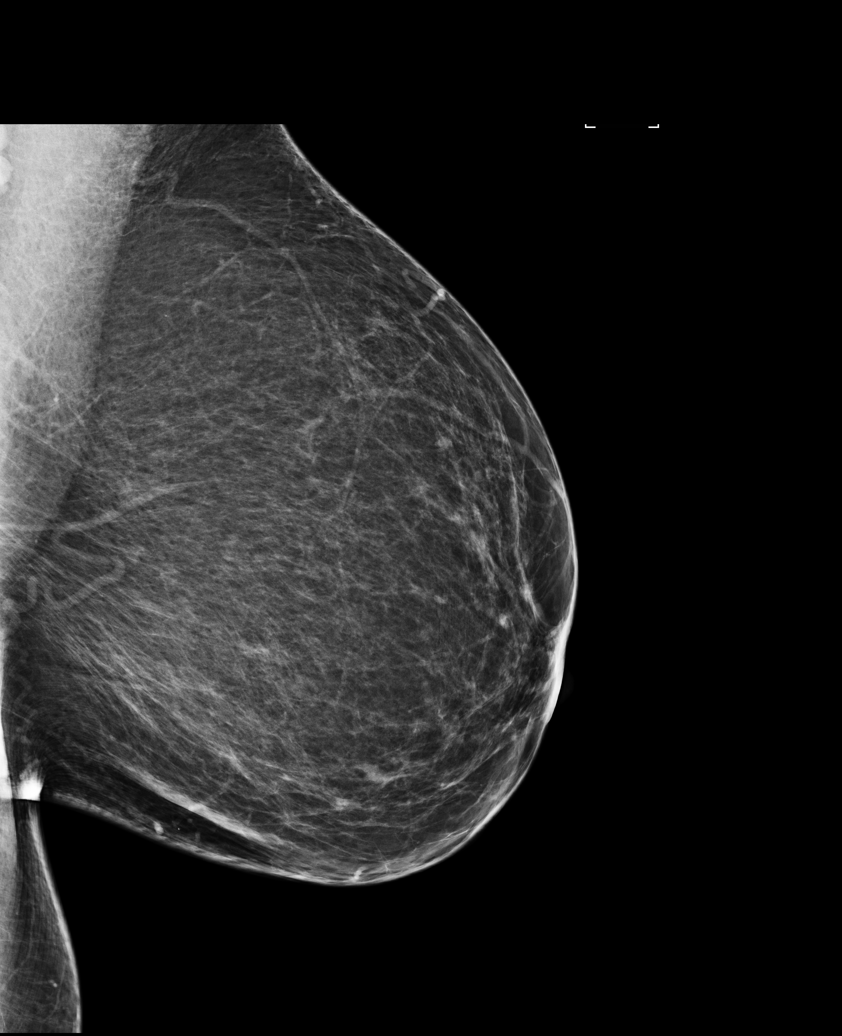

[R MLO (2 of 2)]
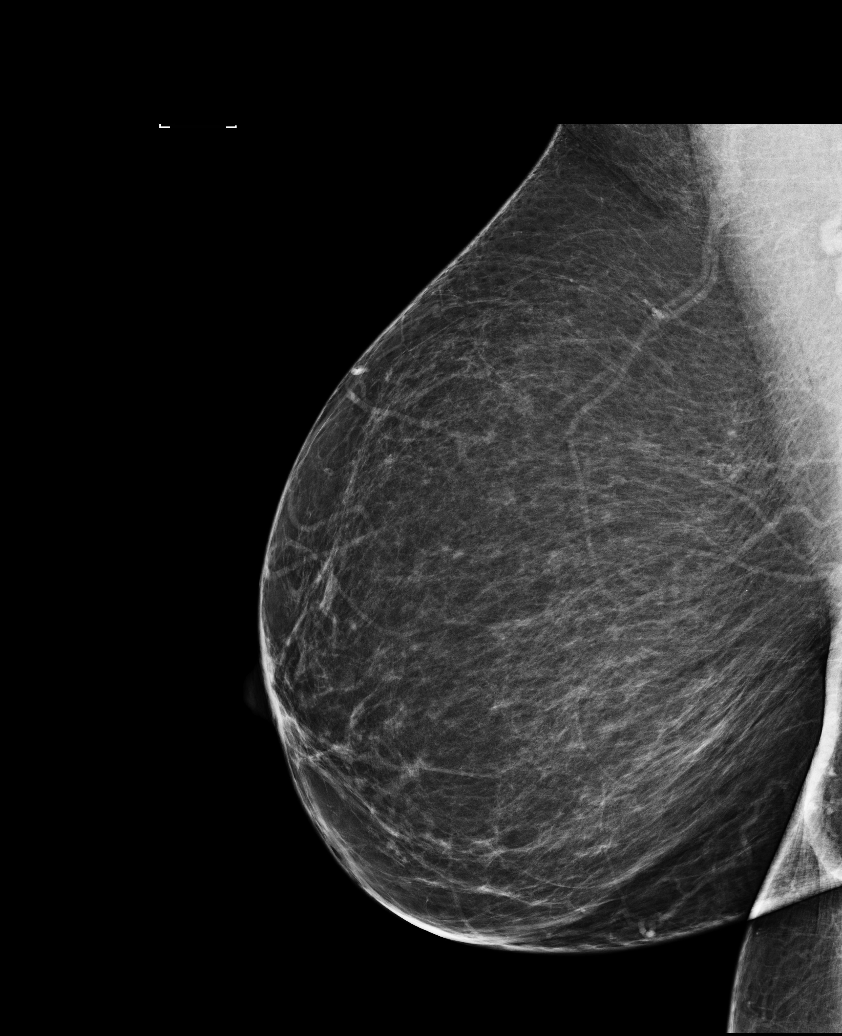

[5 of 5 positions shown; findings below may reference images not displayed]

Prior mammograms from [facility] [REDACTED[facility], were requested multiple times but are still not
available for comparison, accounting for the delay in this report.

ACR Breast Density Category b: There are scattered areas of
fibroglandular density.
FINDINGS: In the left breast, possible masses warrant further evaluation. In
the right breast, no findings suspicious for malignancy. Images were
processed with CAD.
IMPRESSION: Further evaluation is suggested for possible masses in the left
breast.

RECOMMENDATION:
Diagnostic mammogram and possibly ultrasound of the left breast.
(Code:1O-W-776)

The patient will be contacted regarding the findings, and additional
imaging will be scheduled.

BI-RADS CATEGORY  0: Incomplete. Need additional imaging evaluation
and/or prior mammograms for comparison.

ADDENDUM:
I now have available prior mammograms dated 10/16/2010 from [facility]
[REDACTED] in [REDACTED], [facility]. The mass in the UPPER OUTER
LEFT breast identified on the current screening mammogram is new
since the prior examination and requires further evaluation. The
mass identified in the LOWER breast was present previously and has
decreased in size in the interval, indicating benignity.

RECOMMENDATION:

Diagnostic LEFT mammogram and possible LEFT breast ultrasound.

BI-RADS CATEGORY:

0: Incomplete. Need additional imaging evaluation and/or prior
mammograms for comparison.

*** End of Addendum ***
Prior mammograms from [facility] [[facility], were requested multiple times but are still not
available for comparison, accounting for the delay in this report.

ACR Breast Density Category b: There are scattered areas of
fibroglandular density.
FINDINGS: In the left breast, possible masses warrant further evaluation. In
the right breast, no findings suspicious for malignancy. Images were
processed with CAD.
IMPRESSION: Further evaluation is suggested for possible masses in the left
breast.

RECOMMENDATION:
Diagnostic mammogram and possibly ultrasound of the left breast.
(Code:[id number]-W-776)

The patient will be contacted regarding the findings, and additional
imaging will be scheduled.

BI-RADS CATEGORY  0: Incomplete. Need additional imaging evaluation
and/or prior mammograms for comparison.

## 2021-06-11 ENCOUNTER — Other Ambulatory Visit: Payer: Self-pay | Admitting: Medical

## 2021-06-13 ENCOUNTER — Ambulatory Visit (INDEPENDENT_AMBULATORY_CARE_PROVIDER_SITE_OTHER): Payer: Managed Care, Other (non HMO) | Admitting: Medical

## 2021-06-13 VITALS — BP 110/68 | HR 68 | Wt 188.6 lb

## 2021-06-13 DIAGNOSIS — Z1211 Encounter for screening for malignant neoplasm of colon: Secondary | ICD-10-CM

## 2021-06-13 DIAGNOSIS — E785 Hyperlipidemia, unspecified: Secondary | ICD-10-CM

## 2021-06-13 DIAGNOSIS — R7301 Impaired fasting glucose: Secondary | ICD-10-CM

## 2021-06-13 DIAGNOSIS — I10 Essential (primary) hypertension: Secondary | ICD-10-CM

## 2021-06-13 DIAGNOSIS — Z122 Encounter for screening for malignant neoplasm of respiratory organs: Secondary | ICD-10-CM

## 2021-06-13 DIAGNOSIS — Z7185 Encounter for immunization safety counseling: Secondary | ICD-10-CM | POA: Diagnosis not present

## 2021-06-13 DIAGNOSIS — Z1231 Encounter for screening mammogram for malignant neoplasm of breast: Secondary | ICD-10-CM

## 2021-06-13 DIAGNOSIS — E559 Vitamin D deficiency, unspecified: Secondary | ICD-10-CM

## 2021-06-13 DIAGNOSIS — F17200 Nicotine dependence, unspecified, uncomplicated: Secondary | ICD-10-CM

## 2021-06-13 DIAGNOSIS — E049 Nontoxic goiter, unspecified: Secondary | ICD-10-CM

## 2021-06-13 MED ORDER — ENALAPRIL-HYDROCHLOROTHIAZIDE 10-25 MG PO TABS: 1.0000 | ORAL_TABLET | Freq: Every day | ORAL | 3 refills | Status: DC

## 2021-06-13 MED ORDER — ROSUVASTATIN CALCIUM 20 MG PO TABS: 20.0000 mg | ORAL_TABLET | Freq: Every day | ORAL | 3 refills | Status: DC

## 2021-06-13 MED ORDER — AMLODIPINE BESYLATE 10 MG PO TABS: 10.0000 mg | ORAL_TABLET | Freq: Every day | ORAL | 3 refills | Status: DC

## 2021-06-13 MED ORDER — VITAMIN D 50 MCG (2000 UT) PO CAPS: 1.0000 | ORAL_CAPSULE | Freq: Every day | ORAL | 3 refills | Status: DC

## 2021-06-13 MED ORDER — POTASSIUM CHLORIDE ER 10 MEQ PO TBCR: 10.0000 meq | EXTENDED_RELEASE_TABLET | Freq: Two times a day (BID) | ORAL | 3 refills | Status: DC

## 2021-06-13 MED ORDER — ASPIRIN 81 MG PO TBEC
81.0000 mg | DELAYED_RELEASE_TABLET | Freq: Every day | ORAL | 3 refills | Status: DC
Start: 2021-06-13 — End: 2022-10-17

## 2021-06-13 NOTE — Progress Notes (Signed)
Subjective:  Ariel Sweeney is a 56 y.o. female who presents for Chief Complaint  Patient presents with   med check    Med check- not sleeping a lot, post nasal drip x 2 years     Here for routine med check  Last visit a year ago  Hypertension - compliant with In.   No concerns, no chest pain, no edema.    Hyperlipidemia - compliant with medicaiton without c/o  Vit d deficiency - complaint with supplement  Still smokes, x 30+ years  Exercising some  Working full time in reception at [employer]  No other aggravating or relieving factors.    No other c/o.  Past Medical History:  Diagnosis Date   Decreased pulse    normal ABIs 06/2016   Full dentures 08/2018   GERD (gastroesophageal reflux disease)    intermittent   Goiter    Hyperlipidemia    Hypertension    Obesity    Tobacco use    Vitamin D deficiency    Wears contact lenses    Current Outpatient Medications on File Prior to Visit  Medication Sig Dispense Refill   cyanocobalamin 500 MCG tablet Take 500 mcg by mouth every other day.     diphenhydramine-acetaminophen (TYLENOL PM) 25-500 MG TABS tablet Take 1 tablet by mouth at bedtime as needed.     No current facility-administered medications on file prior to visit.     The following portions of the patient's history were reviewed and updated as appropriate: allergies, current medications, past family history, past medical history, past social history, past surgical history and problem list.  ROS Otherwise as in subjective above  Objective: BP 110/68   Pulse 68   Wt 188 lb 9.6 oz (85.5 kg)   BMI 34.50 kg/m   General appearance: alert, no distress, well developed, well nourished HEENT: normocephalic, sclerae anicteric, conjunctiva pink and moist, TMs pearly, nares patent, no discharge or erythema, pharynx normal Oral cavity: MMM, no lesions Neck: supple, no lymphadenopathy, no thyromegaly, no masses, no bruits Heart: RRR, normal S1, S2, no  murmurs Lungs: CTA bilaterally, no wheezes, rhonchi, or rales Abdomen: +bs, soft, non tender, non distended, no masses, no hepatomegaly, no splenomegaly Pulses: 2+ radial pulses, 2+ pedal pulses, normal cap refill Ext: no edema   Assessment: Encounter Diagnoses  Name Primary?   Essential hypertension Yes   Hyperlipidemia, unspecified hyperlipidemia type    Vitamin D deficiency    Vaccine counseling    Tobacco use disorder    Thyroid goiter    Encounter for screening mammogram for malignant neoplasm of breast    Colon cancer screening    Screening for lung cancer    Impaired fasting blood sugar      Plan: HTN - continue current therapy, refills today, routine labs today  hyperlipidemia - refills today, labs today  Vit D deficiency - continue supplement  Shingles vaccine:  I recommend you have a shingles vaccine to help prevent shingles or herpes zoster outbreak.   Please call your insurer to inquire about coverage for the Shingrix vaccine given in 2 doses.   Some insurers cover this vaccine after age 36, some cover this after age 66.  If your insurer covers this, then call to schedule appointment to have this vaccine here.  Refer back to gastroenterology for updated colonoscopy  Mammogram order placed  Advise she quit smoking.  Offered medications to help with smoking.  She will consider.  PFT reviewed today  normal  Ordered CT lung cancer screening   Ariel Sweeney was seen today for med check.  Diagnoses and all orders for this visit:  Essential hypertension -     Comprehensive metabolic panel -     CBC  Hyperlipidemia, unspecified hyperlipidemia type -     Lipid panel  Vitamin D deficiency  Vaccine counseling  Tobacco use disorder -     Spirometry with Graph -     CT CHEST LUNG CA SCREEN LOW DOSE W/O CM; Future  Thyroid goiter -     TSH  Encounter for screening mammogram for malignant neoplasm of breast -     MM DIGITAL SCREENING BILATERAL; Future  Colon  cancer screening -     Ambulatory referral to Gastroenterology  Screening for lung cancer -     CT CHEST LUNG CA SCREEN LOW DOSE W/O CM; Future  Impaired fasting blood sugar -     Hemoglobin A1c  Other orders -     amLODipine (NORVASC) 10 MG tablet; Take 1 tablet (10 mg total) by mouth daily. -     aspirin EC (EQ ASPIRIN ADULT LOW DOSE) 81 MG tablet; Take 1 tablet (81 mg total) by mouth daily. Swallow whole. -     enalapril-hydrochlorothiazide (VASERETIC) 10-25 MG tablet; Take 1 tablet by mouth daily. -     potassium chloride (KLOR-CON) 10 MEQ tablet; Take 1 tablet (10 mEq total) by mouth 2 (two) times daily. -     rosuvastatin (CRESTOR) 20 MG tablet; Take 1 tablet (20 mg total) by mouth at bedtime. -     Cholecalciferol (VITAMIN D) 50 MCG (2000 UT) CAPS; Take 1 capsule (2,000 Units total) by mouth daily.    Follow up: pending labs, studies

## 2021-06-14 LAB — HEMOGLOBIN A1C
Est. average glucose Bld gHb Est-mCnc: 177 mg/dL
Hgb A1c MFr Bld: 7.8 % — ABNORMAL HIGH (ref 4.8–5.6)

## 2021-06-14 LAB — LIPID PANEL
Chol/HDL Ratio: 3.2 ratio (ref 0.0–4.4)
Cholesterol, Total: 126 mg/dL (ref 100–199)
HDL: 40 mg/dL (ref >39)
LDL Chol Calc (NIH): 50 mg/dL (ref 0–99)
Triglycerides: 222 mg/dL — ABNORMAL HIGH (ref 0–149)
VLDL Cholesterol Cal: 36 mg/dL (ref 5–40)

## 2021-06-14 LAB — COMPREHENSIVE METABOLIC PANEL
ALT: 18 IU/L (ref 0–32)
AST: 17 IU/L (ref 0–40)
Albumin/Globulin Ratio: 1.6 (ref 1.2–2.2)
Albumin: 4.1 g/dL (ref 3.8–4.9)
Alkaline Phosphatase: 67 IU/L (ref 44–121)
BUN/Creatinine Ratio: 13 (ref 9–23)
BUN: 14 mg/dL (ref 6–24)
Bilirubin Total: <0.2 mg/dL (ref 0.0–1.2)
CO2: 20 mmol/L (ref 20–29)
Calcium: 9.2 mg/dL (ref 8.7–10.2)
Chloride: 101 mmol/L (ref 96–106)
Creatinine, Ser: 1.1 mg/dL — ABNORMAL HIGH (ref 0.57–1.00)
Globulin, Total: 2.5 g/dL (ref 1.5–4.5)
Glucose: 171 mg/dL — ABNORMAL HIGH (ref 70–99)
Potassium: 3.5 mmol/L (ref 3.5–5.2)
Sodium: 142 mmol/L (ref 134–144)
Total Protein: 6.6 g/dL (ref 6.0–8.5)
eGFR: 59 mL/min/{1.73_m2} — ABNORMAL LOW (ref >59)

## 2021-06-14 LAB — CBC
Hematocrit: 34.7 % (ref 34.0–46.6)
Hemoglobin: 10.6 g/dL — ABNORMAL LOW (ref 11.1–15.9)
MCH: 25.1 pg — ABNORMAL LOW (ref 26.6–33.0)
MCHC: 30.5 g/dL — ABNORMAL LOW (ref 31.5–35.7)
MCV: 82 fL (ref 79–97)
Platelets: 271 10*3/uL (ref 150–450)
RBC: 4.23 x10E6/uL (ref 3.77–5.28)
RDW: 15.2 % (ref 11.7–15.4)
WBC: 4.8 10*3/uL (ref 3.4–10.8)

## 2021-06-14 LAB — TSH: TSH: 1.06 u[IU]/mL (ref 0.450–4.500)

## 2021-06-18 ENCOUNTER — Other Ambulatory Visit: Payer: Self-pay | Admitting: Medical

## 2021-06-19 ENCOUNTER — Other Ambulatory Visit: Payer: Self-pay | Admitting: Medical

## 2021-06-19 MED ORDER — SEMAGLUTIDE(0.25 OR 0.5MG/DOS) 2 MG/3ML ~~LOC~~ SOPN: 0.2500 mg | PEN_INJECTOR | SUBCUTANEOUS | 2 refills | Status: DC

## 2021-06-21 ENCOUNTER — Telehealth: Payer: Self-pay | Admitting: Medical

## 2021-06-21 MED ORDER — SEMAGLUTIDE(0.25 OR 0.5MG/DOS) 2 MG/3ML ~~LOC~~ SOPN: 0.2500 mg | PEN_INJECTOR | SUBCUTANEOUS | 2 refills | Status: DC

## 2021-06-21 NOTE — Telephone Encounter (Signed)
Pt called and states that at her appt she changed her pharmacy, but medication was sent to old pharmacy. Please send ozempic to new pharmacy which is [facility] on [address].

## 2021-06-21 NOTE — Telephone Encounter (Signed)
Resent to new pharmacy

## 2021-06-26 ENCOUNTER — Telehealth: Payer: Self-pay | Admitting: Medical

## 2021-06-26 ENCOUNTER — Other Ambulatory Visit: Payer: Self-pay | Admitting: Medical

## 2021-06-26 MED ORDER — TRULICITY 0.75 MG/0.5ML ~~LOC~~ SOAJ: 0.7500 mg | SUBCUTANEOUS | 1 refills | Status: DC

## 2021-06-26 NOTE — Telephone Encounter (Signed)
Insurance is requiring use of Trulicity instead of Ozempic.  I sent this instead to the pharmacy.

## 2021-08-13 ENCOUNTER — Other Ambulatory Visit: Payer: Self-pay | Admitting: Medical

## 2021-09-09 ENCOUNTER — Other Ambulatory Visit: Payer: Self-pay | Admitting: Medical

## 2021-09-18 ENCOUNTER — Encounter: Payer: Self-pay | Admitting: Internal Medicine

## 2021-10-02 ENCOUNTER — Encounter: Payer: Self-pay | Admitting: Medical

## 2021-10-22 ENCOUNTER — Encounter: Payer: Self-pay | Admitting: Internal Medicine

## 2021-10-31 ENCOUNTER — Other Ambulatory Visit: Payer: Self-pay | Admitting: Medical

## 2021-11-28 ENCOUNTER — Other Ambulatory Visit: Payer: Self-pay | Admitting: Medical

## 2021-12-09 ENCOUNTER — Telehealth: Payer: Self-pay | Admitting: Medical

## 2021-12-09 MED ORDER — HYDROCHLOROTHIAZIDE 25 MG PO TABS: 25.0000 mg | ORAL_TABLET | Freq: Every day | ORAL | 1 refills | Status: DC

## 2021-12-09 MED ORDER — ENALAPRIL MALEATE 10 MG PO TABS: 10.0000 mg | ORAL_TABLET | Freq: Every day | ORAL | 1 refills | Status: DC

## 2021-12-09 NOTE — Telephone Encounter (Signed)
Fax refill from [place] 10-25  This is unavailable per pharmacy note  Can you split into 2 separate RX's for this

## 2021-12-09 NOTE — Telephone Encounter (Signed)
I have refilled them separately

## 2021-12-26 ENCOUNTER — Other Ambulatory Visit: Payer: Self-pay | Admitting: Medical

## 2022-01-31 ENCOUNTER — Other Ambulatory Visit: Payer: Self-pay | Admitting: Medical

## 2022-01-31 NOTE — Telephone Encounter (Signed)
Called and left message for pt to call back to schedule a follow-up visit

## 2022-02-03 ENCOUNTER — Other Ambulatory Visit: Payer: Self-pay | Admitting: Medical

## 2022-02-03 NOTE — Telephone Encounter (Signed)
Left detailed message to see if pt needs refill before 1/24

## 2022-02-03 NOTE — Telephone Encounter (Signed)
Will call pt to see if she has enough until her appointment

## 2022-02-05 ENCOUNTER — Telehealth (INDEPENDENT_AMBULATORY_CARE_PROVIDER_SITE_OTHER): Payer: PRIVATE HEALTH INSURANCE | Admitting: Medical

## 2022-02-05 ENCOUNTER — Encounter: Payer: Self-pay | Admitting: Medical

## 2022-02-05 VITALS — Wt 182.0 lb

## 2022-02-05 DIAGNOSIS — E785 Hyperlipidemia, unspecified: Secondary | ICD-10-CM | POA: Diagnosis not present

## 2022-02-05 DIAGNOSIS — R0683 Snoring: Secondary | ICD-10-CM

## 2022-02-05 DIAGNOSIS — E118 Type 2 diabetes mellitus with unspecified complications: Secondary | ICD-10-CM

## 2022-02-05 DIAGNOSIS — I10 Essential (primary) hypertension: Secondary | ICD-10-CM

## 2022-02-05 DIAGNOSIS — R5383 Other fatigue: Secondary | ICD-10-CM | POA: Diagnosis not present

## 2022-02-05 DIAGNOSIS — E049 Nontoxic goiter, unspecified: Secondary | ICD-10-CM

## 2022-02-05 DIAGNOSIS — E559 Vitamin D deficiency, unspecified: Secondary | ICD-10-CM

## 2022-02-05 DIAGNOSIS — F17210 Nicotine dependence, cigarettes, uncomplicated: Secondary | ICD-10-CM

## 2022-02-05 DIAGNOSIS — G8929 Other chronic pain: Secondary | ICD-10-CM

## 2022-02-05 DIAGNOSIS — M25512 Pain in left shoulder: Secondary | ICD-10-CM

## 2022-02-05 DIAGNOSIS — J069 Acute upper respiratory infection, unspecified: Secondary | ICD-10-CM

## 2022-02-05 DIAGNOSIS — G479 Sleep disorder, unspecified: Secondary | ICD-10-CM

## 2022-02-05 DIAGNOSIS — M25511 Pain in right shoulder: Secondary | ICD-10-CM

## 2022-02-05 DIAGNOSIS — F17200 Nicotine dependence, unspecified, uncomplicated: Secondary | ICD-10-CM

## 2022-02-05 MED ORDER — HYDROCHLOROTHIAZIDE 25 MG PO TABS: 25.0000 mg | ORAL_TABLET | Freq: Every day | ORAL | 0 refills | Status: DC

## 2022-02-05 MED ORDER — ENALAPRIL MALEATE 10 MG PO TABS: 10.0000 mg | ORAL_TABLET | Freq: Every day | ORAL | 0 refills | Status: DC

## 2022-02-05 MED ORDER — TRULICITY 1.5 MG/0.5ML ~~LOC~~ SOAJ: 1.5000 mg | SUBCUTANEOUS | 0 refills | Status: DC

## 2022-02-05 MED ORDER — FREESTYLE LIBRE 3 SENSOR MISC: 2 refills | Status: DC

## 2022-02-05 NOTE — Progress Notes (Signed)
Sent libre sensors to pharmacy

## 2022-02-05 NOTE — Addendum Note (Signed)
Addended by: Minette Headland A on: 02/05/2022 11:42 AM   Modules accepted: Orders

## 2022-02-05 NOTE — Progress Notes (Signed)
Subjective:     Patient ID: Ariel Sweeney, female   DOB: 12/09/1965, 57 y.o.   MRN: [id number]  This visit type was conducted due to national recommendations for restrictions regarding the COVID-19 Pandemic (e.g. social distancing) in an effort to limit this patient's exposure and mitigate transmission in our community.  Due to their co-morbid illnesses, this patient is at least at moderate risk for complications without adequate follow up.  This format is felt to be most appropriate for this patient at this time.    Documentation for virtual audio and video telecommunications through [place] encounter:  The patient was located at home. The provider was located in the office. The patient did consent to this visit and is aware of possible charges through their insurance for this visit.  The other persons participating in this telemedicine service were none. Time spent on call was 20 minutes and in review of previous records 20 minutes total.  This virtual service is not related to other E/M service within previous 7 days.   HPI Chief Complaint  Patient presents with   Med check    Med check- needs refills on meds   Virtual for recheck on chronic issues, medications.    Diabetes, new diagnosis last visit.  She was supposed to return a month after her June visit is just now getting back for follow-up.  She is compliant with Trulicity which was started after last visit.  Exercise - not much.   Does not have a glucometer.   Denies polyuria, no polydipsia, no blurred vision.     Still has trouble with sleep chronic, ongoing.  Has trouble getting to sleep and staying asleep. Takes 2 tylenol PM to help with sleep.  No prior prescription sleep aids.  Had sleep study maybe 15 years ago in [place]  she doesn't think she actually went to sleep using the home study.     Last few weeks having lots of pain in right shoulder.  Has a history of shoulder pains in general.  Has seen orthopedics a couple  years ago had a steroid injection.  Lately her shoulders have been flaring back up there.  No recent injury or trauma  This past Sunday started having cold symptoms.   Using nyquil at night, alka seltzer in the day.  Feels a lot better than a few days ago.  No fever, just some cough and runny nose.    Hyperlipidemia-compliant with medication without complaint  Hypertension-compliant with medications without complaint   Past Medical History:  Diagnosis Date   Decreased pulse    normal ABIs 06/2016   Full dentures 08/2018   GERD (gastroesophageal reflux disease)    intermittent   Goiter    Hyperlipidemia    Hypertension    Obesity    Tobacco use    Vitamin D deficiency    Wears contact lenses    Current Outpatient Medications on File Prior to Visit  Medication Sig Dispense Refill   amLODipine (NORVASC) 10 MG tablet Take 1 tablet (10 mg total) by mouth daily. 90 tablet 3   aspirin EC (EQ ASPIRIN ADULT LOW DOSE) 81 MG tablet Take 1 tablet (81 mg total) by mouth daily. Swallow whole. 90 tablet 3   Cholecalciferol (VITAMIN D) 50 MCG (2000 UT) CAPS Take 1 capsule (2,000 Units total) by mouth daily. 90 capsule 3   cyanocobalamin 500 MCG tablet Take 500 mcg by mouth every other day.     potassium chloride (KLOR-CON) 10 MEQ  tablet Take 1 tablet (10 mEq total) by mouth 2 (two) times daily. 180 tablet 3   rosuvastatin (CRESTOR) 20 MG tablet Take 1 tablet (20 mg total) by mouth at bedtime. 90 tablet 3   diphenhydramine-acetaminophen (TYLENOL PM) 25-500 MG TABS tablet Take 1 tablet by mouth at bedtime as needed.     No current facility-administered medications on file prior to visit.    Review of Systems As in subjective    Objective:   Physical Exam Due to coronavirus pandemic stay at home measures, patient visit was virtual and they were not examined in person.   Wt 182 lb (82.6 kg)   BMI 33.29 kg/m   Gen: wd, wn ,nad Psych: Pleasant, good eye contact, answers questions  appropriately      Assessment:     Encounter Diagnoses  Name Primary?   Diabetes mellitus type 2 with complications ([place]) Yes   Essential hypertension    Hyperlipidemia, unspecified hyperlipidemia type    Fatigue, unspecified type    Thyroid goiter    Vitamin D deficiency    Tobacco use disorder    Sleep disturbance    Snoring    Upper respiratory tract infection, unspecified type    Chronic pain of both shoulders        Plan:     Diabetes type 2 new diagnosis 2023 where she had been borderline prior.  She is compliant with Trulicity weekly.  She will come in next week hopefully for fasting labs.  I recommended she exercises regularly, continue efforts to eat a healthy low sugar diet.  Plan to go ahead and increase Trulicity dose.  We were initially going to use Ozempic but insurance would not pay for this  Hypertension-continue amlodipine 10 mg daily, enalapril 10 mg daily, hydrochlorothiazide 25 mg daily and potassium  Hyperlipidemia-continue Crestor 20 mg daily  Vitamin D deficiency-continue supplement  She continues to smoke  Snoring, sleep disturbance-referral to neurology for evaluation  Chronic shoulder pains-recommended follow-up with orthopedics.  She declines for now.  Recent URI symptoms improving  I will have my nurse reach out to her about freestyle libre testing device  Casandra was seen today for med check.  Diagnoses and all orders for this visit:  Diabetes mellitus type 2 with complications ([place]) -     Hemoglobin A1c; Future -     Microalbumin/Creatinine Ratio, Urine; Future  Essential hypertension -     POCT Urinalysis DIP (Proadvantage Device); Future  Hyperlipidemia, unspecified hyperlipidemia type  Fatigue, unspecified type -     CBC with Differential/Platelet; Future  Thyroid goiter  Vitamin D deficiency  Tobacco use disorder  Sleep disturbance -     Ambulatory referral to Neurology  Snoring -     Ambulatory referral to  Neurology  Upper respiratory tract infection, unspecified type  Chronic pain of both shoulders  Other orders -     enalapril (VASOTEC) 10 MG tablet; Take 1 tablet (10 mg total) by mouth daily. -     hydrochlorothiazide (HYDRODIURIL) 25 MG tablet; Take 1 tablet (25 mg total) by mouth daily. -     Dulaglutide (TRULICITY) 1.5 ZS/0.1UX SOPN; Inject 1.5 mg into the skin once a week.  F/u soon in person for fasting labs

## 2022-02-06 ENCOUNTER — Telehealth: Payer: Self-pay | Admitting: Medical

## 2022-02-06 NOTE — Telephone Encounter (Signed)
-----  Message from Carlena Hurl, PA-C sent at 02/05/2022 11:38 AM EST ----- Get on schedule next week for fasting labs, and schedule next CPX fasting in the next 4-47mo

## 2022-02-06 NOTE — Telephone Encounter (Signed)
Spoke with Ariel Sweeney and she says she has a slight cold right now and wants to wait to schedule her lab visit and physical. She said she will call when she is feeling better.

## 2022-02-08 ENCOUNTER — Telehealth: Payer: Self-pay | Admitting: Medical

## 2022-02-08 NOTE — Telephone Encounter (Signed)
P.A. TRULICITY done & approved til 02/07/23, sent mychart message

## 2022-03-10 ENCOUNTER — Ambulatory Visit (INDEPENDENT_AMBULATORY_CARE_PROVIDER_SITE_OTHER): Payer: BC Managed Care – PPO | Admitting: Medical

## 2022-03-10 ENCOUNTER — Encounter: Payer: Self-pay | Admitting: Medical

## 2022-03-10 ENCOUNTER — Other Ambulatory Visit: Payer: Self-pay | Admitting: Medical

## 2022-03-10 VITALS — BP 120/80 | HR 78 | Ht 62.0 in | Wt 179.6 lb

## 2022-03-10 DIAGNOSIS — F17200 Nicotine dependence, unspecified, uncomplicated: Secondary | ICD-10-CM | POA: Diagnosis not present

## 2022-03-10 DIAGNOSIS — Z1211 Encounter for screening for malignant neoplasm of colon: Secondary | ICD-10-CM | POA: Diagnosis not present

## 2022-03-10 DIAGNOSIS — I10 Essential (primary) hypertension: Secondary | ICD-10-CM | POA: Diagnosis not present

## 2022-03-10 DIAGNOSIS — E118 Type 2 diabetes mellitus with unspecified complications: Secondary | ICD-10-CM

## 2022-03-10 DIAGNOSIS — Z122 Encounter for screening for malignant neoplasm of respiratory organs: Secondary | ICD-10-CM

## 2022-03-10 DIAGNOSIS — E559 Vitamin D deficiency, unspecified: Secondary | ICD-10-CM

## 2022-03-10 DIAGNOSIS — Q845 Enlarged and hypertrophic nails: Secondary | ICD-10-CM

## 2022-03-10 DIAGNOSIS — Z1231 Encounter for screening mammogram for malignant neoplasm of breast: Secondary | ICD-10-CM

## 2022-03-10 DIAGNOSIS — E049 Nontoxic goiter, unspecified: Secondary | ICD-10-CM | POA: Diagnosis not present

## 2022-03-10 DIAGNOSIS — R7301 Impaired fasting glucose: Secondary | ICD-10-CM | POA: Diagnosis not present

## 2022-03-10 DIAGNOSIS — E785 Hyperlipidemia, unspecified: Secondary | ICD-10-CM

## 2022-03-10 LAB — POCT URINALYSIS DIP (PROADVANTAGE DEVICE)
Bilirubin, UA: NEGATIVE
Blood, UA: NEGATIVE
Glucose, UA: NEGATIVE mg/dL
Ketones, POC UA: NEGATIVE mg/dL
Leukocytes, UA: NEGATIVE
Nitrite, UA: NEGATIVE
Protein Ur, POC: 30 mg/dL — AB
Specific Gravity, Urine: 1.01
Urobilinogen, Ur: 0.2
pH, UA: 7 (ref 5.0–8.0)

## 2022-03-10 NOTE — Progress Notes (Signed)
Subjective:  Ariel Sweeney is a 57 y.o. female who presents for Chief Complaint  Patient presents with   Follow-up    Follow up for fasting labs. No new concerns.      Diabetes- She is compliant with Trulicity which was started after last visit.  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  Is using normal at the urgent care but she could not give me a specific number.  Denies polyuria, no polydipsia, no blurred vision.      Hyperlipidemia-compliant with medication without complaint, rosuvastatin 20 mg daily   Hypertension-compliant with medications without complaint, amlodipine 10 mg daily, hydrochlorothiazide 25 mg daily, enalapril 10 mg daily  Vitamin D deficiency-compliant with supplement  Last eye doctor visit a little over a year ago  She is fasting today  No other aggravating or relieving factors.    No other c/o.  Past Medical History:  Diagnosis Date   Decreased pulse    normal ABIs 06/2016   Full dentures 08/2018   GERD (gastroesophageal reflux disease)    intermittent   Goiter    Hyperlipidemia    Hypertension    Obesity    Tobacco use    Vitamin D deficiency    Wears contact lenses    Current Outpatient Medications on File Prior to Visit  Medication Sig Dispense Refill   amLODipine (NORVASC) 10 MG tablet Take 1 tablet (10 mg total) by mouth daily. 90 tablet 3   aspirin EC (EQ ASPIRIN ADULT LOW DOSE) 81 MG tablet Take 1 tablet (81 mg total) by mouth daily. Swallow whole. 90 tablet 3   Cholecalciferol (VITAMIN D) 50 MCG (2000 UT) CAPS Take 1 capsule (2,000 Units total) by mouth daily. 90 capsule 3   cyanocobalamin 500 MCG tablet Take 500 mcg by mouth every other day.     diphenhydramine-acetaminophen (TYLENOL PM) 25-500 MG TABS tablet Take 1 tablet by mouth at bedtime as needed.     Dulaglutide (TRULICITY) 1.5 0000000 SOPN Inject 1.5 mg into the skin once a week. 2 mL 0   enalapril (VASOTEC) 10 MG tablet Take 1 tablet (10 mg total) by  mouth daily. 90 tablet 0   hydrochlorothiazide (HYDRODIURIL) 25 MG tablet Take 1 tablet (25 mg total) by mouth daily. 90 tablet 0   potassium chloride (KLOR-CON) 10 MEQ tablet Take 1 tablet (10 mEq total) by mouth 2 (two) times daily. 180 tablet 3   rosuvastatin (CRESTOR) 20 MG tablet Take 1 tablet (20 mg total) by mouth at bedtime. 90 tablet 3   No current facility-administered medications on file prior to visit.    The following portions of the patient's history were reviewed and updated as appropriate: allergies, current medications, past family history, past medical history, past social history, past surgical history and problem list.  ROS Otherwise as in subjective above  Objective: BP 120/80   Pulse 78   Ht '5\' 2"'$  (1.575 m)   Wt 179 lb 9.6 oz (81.5 kg)   BMI 32.85 kg/m   Wt Readings from Last 3 Encounters:  03/10/22 179 lb 9.6 oz (81.5 kg)  02/05/22 182 lb (82.6 kg)  06/13/21 188 lb 9.6 oz (85.5 kg)   General appearance: alert, no distress, well developed, well nourished Neck: supple, no lymphadenopathy,generalized goiter, no nodules distinct, no masses Heart: RRR, normal S1, S2, no murmurs Lungs: CTA bilaterally, no wheezes, rhonchi, or rales Pulses: 2+ radial pulses, 1+ pedal pulses, normal cap refill Ext: no edema  Diabetic Foot Exam - Simple   Simple Foot Form Diabetic Foot exam was performed with the following findings: Yes 03/10/2022  9:31 AM  Visual Inspection See comments: Yes Sensation Testing Intact to touch and monofilament testing bilaterally: Yes Pulse Check See comments: Yes Comments 1+ pedal pulses, some hypertrophic nails       Assessment: Encounter Diagnoses  Name Primary?   Essential hypertension Yes   Diabetes mellitus type 2 with complications ([place])    Goiter    Colon cancer screening    Hyperlipidemia, unspecified hyperlipidemia type    Tobacco use disorder    Vitamin D deficiency    Encounter for screening mammogram for malignant  neoplasm of breast    Screening for lung cancer    Screen for colon cancer    Enlarged and hypertrophic nails      Plan: Hypertension-continue current medications  Diabetes-updated labs today, advise she use a glucometer, continue current medications  Goiter-stable, no new nodules  Long-term tobacco use-encouraged cessation  Vitamin D deficiency-continue supplement  Call and schedule mammogram soon  Referral back to GI for updated colonoscopy   Ariel Sweeney was seen today for follow-up.  Diagnoses and all orders for this visit:  Essential hypertension  Diabetes mellitus type 2 with complications ([place]) -     Hemoglobin A1c -     Microalbumin/Creatinine Ratio, Urine -     POCT Urinalysis DIP (Proadvantage Device) -     Ambulatory referral to Podiatry  Goiter -     CBC with Differential/Platelet  Colon cancer screening  Hyperlipidemia, unspecified hyperlipidemia type  Tobacco use disorder -     CBC with Differential/Platelet -     POCT Urinalysis DIP (Proadvantage Device)  Vitamin D deficiency  Encounter for screening mammogram for malignant neoplasm of breast -     MM DIGITAL SCREENING BILATERAL; Future  Screening for lung cancer  Screen for colon cancer -     Ambulatory referral to Gastroenterology  Enlarged and hypertrophic nails -     Ambulatory referral to Podiatry    Follow up: pending labs, referrals

## 2022-03-11 LAB — CBC WITH DIFFERENTIAL/PLATELET
Basophils Absolute: 0 10*3/uL (ref 0.0–0.2)
Basos: 1 %
EOS (ABSOLUTE): 0.2 10*3/uL (ref 0.0–0.4)
Eos: 5 %
Hematocrit: 27.9 % — ABNORMAL LOW (ref 34.0–46.6)
Hemoglobin: 8.4 g/dL — ABNORMAL LOW (ref 11.1–15.9)
Immature Grans (Abs): 0 10*3/uL (ref 0.0–0.1)
Immature Granulocytes: 0 %
Lymphocytes Absolute: 1.6 10*3/uL (ref 0.7–3.1)
Lymphs: 36 %
MCH: 22.3 pg — ABNORMAL LOW (ref 26.6–33.0)
MCHC: 30.1 g/dL — ABNORMAL LOW (ref 31.5–35.7)
MCV: 74 fL — ABNORMAL LOW (ref 79–97)
Monocytes Absolute: 0.4 10*3/uL (ref 0.1–0.9)
Monocytes: 9 %
Neutrophils Absolute: 2.2 10*3/uL (ref 1.4–7.0)
Neutrophils: 49 %
Platelets: 337 10*3/uL (ref 150–450)
RBC: 3.77 x10E6/uL (ref 3.77–5.28)
RDW: 15.1 % (ref 11.7–15.4)
WBC: 4.5 10*3/uL (ref 3.4–10.8)

## 2022-03-11 LAB — HEMOGLOBIN A1C
Est. average glucose Bld gHb Est-mCnc: 134 mg/dL
Hgb A1c MFr Bld: 6.3 % — ABNORMAL HIGH (ref 4.8–5.6)

## 2022-03-11 LAB — MICROALBUMIN / CREATININE URINE RATIO
Creatinine, Urine: 164.2 mg/dL
Microalb/Creat Ratio: 138 mg/g creat — ABNORMAL HIGH (ref 0–29)
Microalbumin, Urine: 226.6 ug/mL

## 2022-03-12 ENCOUNTER — Other Ambulatory Visit: Payer: Self-pay | Admitting: Medical

## 2022-03-12 DIAGNOSIS — D649 Anemia, unspecified: Secondary | ICD-10-CM

## 2022-03-12 MED ORDER — FERROUS GLUCONATE 324 (38 FE) MG PO TABS: 324.0000 mg | ORAL_TABLET | Freq: Every day | ORAL | 0 refills | Status: DC

## 2022-03-12 MED ORDER — ENALAPRIL MALEATE 20 MG PO TABS: 20.0000 mg | ORAL_TABLET | Freq: Every day | ORAL | 1 refills | Status: DC

## 2022-03-12 MED ORDER — HYDROCHLOROTHIAZIDE 25 MG PO TABS: 25.0000 mg | ORAL_TABLET | Freq: Every day | ORAL | 3 refills | Status: DC

## 2022-03-12 MED ORDER — TRULICITY 1.5 MG/0.5ML ~~LOC~~ SOAJ: 1.5000 mg | SUBCUTANEOUS | 3 refills | Status: DC

## 2022-03-12 NOTE — Progress Notes (Signed)
Labs show a drop in hemoglobin, anemia which I don't like.  Are you having any known bleeding?    I am going to refer you to gastroenterology right away for evaluation for blood loss.   Begin iron, fergon right away. I'll send this to pharmacy  Diabetes maker is ok. Microalbumin kidney maker abnormal.  I increased dose of vasotec to help protect kidney.  Continue rest of medicaiton as usual  Follow up with me in 3-4 weeks

## 2022-03-20 ENCOUNTER — Telehealth: Payer: Self-pay

## 2022-03-20 MED ORDER — TRULICITY 1.5 MG/0.5ML ~~LOC~~ SOAJ: 1.5000 mg | SUBCUTANEOUS | 0 refills | Status: DC

## 2022-03-20 NOTE — Telephone Encounter (Signed)
Pt called requesting to have her trulicity sent to [address]. [facility] has been out for 3 weeks. She stated she just wants this sent as a one time thing to [facility].

## 2022-03-20 NOTE — Telephone Encounter (Signed)
done 

## 2022-03-21 DIAGNOSIS — R079 Chest pain, unspecified: Secondary | ICD-10-CM | POA: Diagnosis not present

## 2022-03-27 ENCOUNTER — Other Ambulatory Visit (HOSPITAL_COMMUNITY): Payer: Self-pay

## 2022-03-27 ENCOUNTER — Telehealth: Payer: Self-pay | Admitting: Medical

## 2022-03-27 MED ORDER — TRULICITY 1.5 MG/0.5ML ~~LOC~~ SOAJ
1.5000 mg | SUBCUTANEOUS | 0 refills | Status: DC
  Filled 2022-03-27: qty 2, 28d supply, fill #0

## 2022-03-27 NOTE — Telephone Encounter (Signed)
[facility]  Pt called and states that she can not find Trulicity 1.5 in stock anywhere. She can find Trulicity .75. She wants to know if .75 can be sent in and just change the instructions for the correct dose. Please send to [facility]. Please sent to [facility] on [address]. Pt can be reached at [phone].

## 2022-03-27 NOTE — Telephone Encounter (Signed)
Sent 1.'5mg'$  to [facility] as they have it in stock

## 2022-04-16 ENCOUNTER — Ambulatory Visit: Payer: BC Managed Care – PPO | Admitting: Physician Assistant

## 2022-05-06 ENCOUNTER — Other Ambulatory Visit: Payer: Self-pay | Admitting: Medical

## 2022-05-06 NOTE — Telephone Encounter (Signed)
Looks like  was sent in

## 2022-05-30 ENCOUNTER — Other Ambulatory Visit: Payer: Self-pay | Admitting: Medical

## 2022-06-06 ENCOUNTER — Other Ambulatory Visit: Payer: Self-pay | Admitting: Medical

## 2022-06-12 ENCOUNTER — Other Ambulatory Visit: Payer: Self-pay | Admitting: Medical

## 2022-06-14 ENCOUNTER — Telehealth: Payer: Self-pay | Admitting: Medical

## 2022-06-14 NOTE — Telephone Encounter (Signed)
Pt left message issues with Trulicity,  she has P.A. approval on file, will call pharmacy

## 2022-06-16 ENCOUNTER — Other Ambulatory Visit (HOSPITAL_COMMUNITY): Payer: Self-pay

## 2022-06-16 NOTE — Telephone Encounter (Signed)
Called [facility] pharmacy & response states refill too soon, but hasn't had it filled since 05/05/22,  Called [facility] &  they never filled.  They got rejection in March.  Called [employer] t# [phone], [facility] is pharmacy benefits, but was told [employer] handles their own to call t# [phone] called there & they are closed

## 2022-06-19 NOTE — Telephone Encounter (Signed)
Called [employer] & says [facility] filled 05/05/22 for 4 ml & that is 52 days worth & said next fill not due to 06/26/22.  I called pt & she said she only got 1 box 4 pens 1 a week.  Called pharmacy spoke with [facility] & they state also that pt only got 2 ml 1 box.  They will call ins company & see if they can get this straightened out

## 2022-06-20 ENCOUNTER — Other Ambulatory Visit: Payer: Self-pay | Admitting: Medical

## 2022-06-20 MED ORDER — SEMAGLUTIDE(0.25 OR 0.5MG/DOS) 2 MG/3ML ~~LOC~~ SOPN: 0.5000 mg | PEN_INJECTOR | SUBCUTANEOUS | 1 refills | Status: DC

## 2022-06-20 NOTE — Telephone Encounter (Signed)
Cost $958 as Pt has $3500 deductible & going to deductible with discount card went down to $809.  Spoke with pt & she can't afford this.  Discussed Pt Assistance programs & Trulicity is not accepting any new patients now. I think she will qualify.  Vincenza Hews can pt be switched to Ozempic if I can get her approved for Pt Assistance?

## 2022-06-24 ENCOUNTER — Encounter: Payer: Self-pay | Admitting: Medical

## 2022-06-24 ENCOUNTER — Ambulatory Visit (INDEPENDENT_AMBULATORY_CARE_PROVIDER_SITE_OTHER): Payer: BC Managed Care – PPO | Admitting: Medical

## 2022-06-24 VITALS — BP 118/72 | HR 70 | Ht 62.0 in | Wt 180.2 lb

## 2022-06-24 DIAGNOSIS — M79601 Pain in right arm: Secondary | ICD-10-CM

## 2022-06-24 DIAGNOSIS — F17200 Nicotine dependence, unspecified, uncomplicated: Secondary | ICD-10-CM | POA: Diagnosis not present

## 2022-06-24 DIAGNOSIS — D649 Anemia, unspecified: Secondary | ICD-10-CM

## 2022-06-24 DIAGNOSIS — M25511 Pain in right shoulder: Secondary | ICD-10-CM | POA: Diagnosis not present

## 2022-06-24 DIAGNOSIS — E118 Type 2 diabetes mellitus with unspecified complications: Secondary | ICD-10-CM

## 2022-06-24 DIAGNOSIS — I10 Essential (primary) hypertension: Secondary | ICD-10-CM | POA: Diagnosis not present

## 2022-06-24 DIAGNOSIS — G8929 Other chronic pain: Secondary | ICD-10-CM

## 2022-06-24 DIAGNOSIS — E785 Hyperlipidemia, unspecified: Secondary | ICD-10-CM

## 2022-06-24 LAB — IRON,TIBC AND FERRITIN PANEL

## 2022-06-24 LAB — CBC WITH DIFFERENTIAL/PLATELET
Basophils Absolute: 0 10*3/uL (ref 0.0–0.2)
Hematocrit: 42.5 % (ref 34.0–46.6)
Immature Grans (Abs): 0 10*3/uL (ref 0.0–0.1)
Immature Granulocytes: 0 %
MCH: 27.6 pg (ref 26.6–33.0)
Monocytes Absolute: 0.4 10*3/uL (ref 0.1–0.9)
Monocytes: 7 %
RDW: 15.9 % — ABNORMAL HIGH (ref 11.7–15.4)
WBC: 5.4 10*3/uL (ref 3.4–10.8)

## 2022-06-24 MED ORDER — HYDROCODONE-ACETAMINOPHEN 5-325 MG PO TABS: 1.0000 | ORAL_TABLET | Freq: Two times a day (BID) | ORAL | 0 refills | Status: DC | PRN

## 2022-06-24 NOTE — Progress Notes (Signed)
Subjective:  Ariel Sweeney is a 57 y.o. female who presents for Chief Complaint  Patient presents with   Arm Pain    Right arm pain. Has been in medical field x 30 years, has been out for 3 years. Pain is worst at night, pain is a 9. During the day is a 3. Pain is throbbing pain down to the bone. Uses BioFreeze and tylenol, and is trying to cut. Doesn't help much anyhow.      Here for concerns about right arm pain, several pain of late.   Has had ongoing pains from shoulder to elbow, hurting to the bone, deep pain, throbbing pain. Night time worse pain, day time is tolerable.  Pain 24/7.  Has had been dealing with this for years.   Attributes some of this to prior lifting patients and work activities for 30+ years, but has not done hands on direct patient lifting and care of 3 years.  Works Clinical biochemist on phone currently.  No neck pain.   No numbness or tingling in arm.  No pain into wrist of forearm.   No swelling.  Here for recheck on mood.  She has had some insurance issues and because of financial stressors with her insurance has not come back in for follow-up and never saw gastroenterology after last visit regarding persistent anemia.  She is taking iron daily.  No reported bleeding  No other aggravating or relieving factors.    No other c/o.  Past Medical History:  Diagnosis Date   Decreased pulse    normal ABIs 06/2016   Full dentures 08/2018   GERD (gastroesophageal reflux disease)    intermittent   Goiter    Hyperlipidemia    Hypertension    Obesity    Tobacco use    Vitamin D deficiency    Wears contact lenses    Current Outpatient Medications on File Prior to Visit  Medication Sig Dispense Refill   amLODipine (NORVASC) 10 MG tablet Take 1 tablet by mouth once daily 90 tablet 0   aspirin EC (EQ ASPIRIN ADULT LOW DOSE) 81 MG tablet Take 1 tablet (81 mg total) by mouth daily. Swallow whole. 90 tablet 3   Cholecalciferol (VITAMIN D) 50 MCG (2000 UT) CAPS Take 1 capsule  (2,000 Units total) by mouth daily. 90 capsule 3   cyanocobalamin 500 MCG tablet Take 500 mcg by mouth every other day.     diphenhydramine-acetaminophen (TYLENOL PM) 25-500 MG TABS tablet Take 2 tablets by mouth at bedtime as needed.     enalapril (VASOTEC) 20 MG tablet Take 1 tablet (20 mg total) by mouth daily. 90 tablet 1   ferrous gluconate (FERGON) 324 MG tablet Take 1 tablet by mouth once daily with breakfast 90 tablet 1   hydrochlorothiazide (HYDRODIURIL) 25 MG tablet Take 1 tablet (25 mg total) by mouth daily. 90 tablet 3   potassium chloride (KLOR-CON) 10 MEQ tablet Take 1 tablet (10 mEq total) by mouth 2 (two) times daily. 180 tablet 3   rosuvastatin (CRESTOR) 20 MG tablet Take 1 tablet (20 mg total) by mouth at bedtime. 90 tablet 3   Semaglutide,0.25 or 0.5MG /DOS, 2 MG/3ML SOPN Inject 0.5 mg into the skin once a week. (Patient not taking: Reported on 06/24/2022) 3 mL 1   No current facility-administered medications on file prior to visit.   Past Surgical History:  Procedure Laterality Date   BREAST CYST ASPIRATION Right 2017   cyst    CHOLECYSTECTOMY  COLONOSCOPY  09/2015   tubular adenoma, repeat 5 years;  Dr. Marsa Aris   COLPOSCOPY     history of abnormal pap in remote past   STRABISMUS SURGERY     childhood    The following portions of the patient's history were reviewed and updated as appropriate: allergies, current medications, past family history, past medical history, past social history, past surgical history and problem list.  ROS Otherwise as in subjective above     Objective: BP 118/72   Pulse 70   Ht 5\' 2"  (1.575 m)   Wt 180 lb 3.2 oz (81.7 kg)   SpO2 99%   BMI 32.96 kg/m   General appearance: alert, no distress, well developed, well nourished Right arm with no specific tenderness, no swelling or deformity, range of motion normal, rest of arms unremarkable Neck: Supple, nontender, normal range of motion Arms neurovascularly intact Upper  back nontender   Assessment: Encounter Diagnoses  Name Primary?   Right arm pain Yes   Chronic right shoulder pain    Essential hypertension    Tobacco use disorder    Diabetes mellitus type 2 with complications (HCC)    Hyperlipidemia, unspecified hyperlipidemia type    Anemia, unspecified type      Plan: Right arm pain, chronic shoulder pain-we discussed possible differential.  She had an x-ray of her right shoulder in 2018.  At that time there was some degenerative changes noted.  She has a relatively normal exam.  But her persistent pain chronically could likely represent some shoulder dysfunction.  Referral to orthopedics for further evaluation  Anemia-updated labs today.  Her hemoglobin was quite low back in February and we referred to gastroenterology at that time but she never went.  She is compliant with iron.  No current bleeding.  Update labs today.  Encouraged her to get back into gastroenterology for evaluation  Hypertension-continue amlodipine 10 mg daily, hydrochlorothiazide 25 mg daily, enalapril 20 mg daily  Hyperlipidemia-continue Crestor 20 mg daily, aspirin daily 81 mg  Diabetes-she was not able to get semaglutide for several weeks due to insurance issue.  She will work with our front office today to apply for patient assistance on this medication.   [place] was seen today for arm pain.  Diagnoses and all orders for this visit:  Right arm pain -     Ambulatory referral to Orthopedics  Chronic right shoulder pain -     Ambulatory referral to Orthopedics  Essential hypertension  Tobacco use disorder  Diabetes mellitus type 2 with complications (HCC)  Hyperlipidemia, unspecified hyperlipidemia type  Anemia, unspecified type -     CBC with Differential/Platelet -     Iron, TIBC and Ferritin Panel  Other orders -     HYDROcodone-acetaminophen (NORCO) 5-325 MG tablet; Take 1 tablet by mouth 2 (two) times daily as needed.    Follow up: pending labs

## 2022-06-24 NOTE — Telephone Encounter (Signed)
[employer] is not offering Pt Assistance for new applications for 2024.  Pt came by the office & signed Pt Assistance for Ozempic & sample given to hold.

## 2022-06-25 LAB — IRON,TIBC AND FERRITIN PANEL
Iron Saturation: 39 % (ref 15–55)
Iron: 146 ug/dL (ref 27–159)
Total Iron Binding Capacity: 377 ug/dL (ref 250–450)
UIBC: 231 ug/dL (ref 131–425)

## 2022-06-25 LAB — CBC WITH DIFFERENTIAL/PLATELET
Basos: 1 %
EOS (ABSOLUTE): 0.3 10*3/uL (ref 0.0–0.4)
Eos: 5 %
Hemoglobin: 13.8 g/dL (ref 11.1–15.9)
Lymphocytes Absolute: 1.7 10*3/uL (ref 0.7–3.1)
Lymphs: 31 %
MCHC: 32.5 g/dL (ref 31.5–35.7)
MCV: 85 fL (ref 79–97)
Neutrophils Absolute: 3.1 10*3/uL (ref 1.4–7.0)
Neutrophils: 56 %
Platelets: 260 10*3/uL (ref 150–450)
RBC: 5 x10E6/uL (ref 3.77–5.28)

## 2022-06-25 NOTE — Progress Notes (Signed)
Results sent through MyChart

## 2022-06-27 ENCOUNTER — Telehealth: Payer: Self-pay | Admitting: Medical

## 2022-06-27 NOTE — Telephone Encounter (Signed)
Pt called & states [facility] would only give her #10 of her Hydrocodone & states that was their policy since it was her first time getting it.  I explained never heard of that,  that there is limits of 7 days for narcotics but don't know why they only gave her #10. Checked & I received a P.A. needed for this medication.

## 2022-07-03 NOTE — Progress Notes (Deleted)
Office Visit Note   Patient: Ariel Sweeney           Date of Birth: Dec 16, 1965           MRN: [id number] Visit Date: 07/04/2022              Requested by: Jac Canavan, PA-C [address] [place],  [place] [address] PCP: Jac Canavan, PA-C   Assessment & Plan: Visit Diagnoses: No diagnosis found.  Plan: ***  Follow-Up Instructions: No follow-ups on file.   Orders:  No orders of the defined types were placed in this encounter.  No orders of the defined types were placed in this encounter.     Procedures: No procedures performed   Clinical Data: No additional findings.   Subjective: No chief complaint on file.   HPI  Review of Systems  Constitutional: Negative.   HENT: Negative.    Eyes: Negative.   Respiratory: Negative.    Cardiovascular: Negative.   Endocrine: Negative.   Musculoskeletal: Negative.   Neurological: Negative.   Hematological: Negative.   Psychiatric/Behavioral: Negative.    All other systems reviewed and are negative.    Objective: Vital Signs: There were no vitals taken for this visit.  Physical Exam Vitals and nursing note reviewed.  Constitutional:      Appearance: She is well-developed.  HENT:     Head: Atraumatic.     Nose: Nose normal.  Eyes:     Extraocular Movements: Extraocular movements intact.  Cardiovascular:     Pulses: Normal pulses.  Pulmonary:     Effort: Pulmonary effort is normal.  Abdominal:     Palpations: Abdomen is soft.  Musculoskeletal:     Cervical back: Neck supple.  Skin:    General: Skin is warm.     Capillary Refill: Capillary refill takes less than 2 seconds.  Neurological:     Mental Status: She is alert. Mental status is at baseline.  Psychiatric:        Behavior: Behavior normal.        Thought Content: Thought content normal.        Judgment: Judgment normal.     Ortho Exam  Specialty Comments:  No specialty comments available.  Imaging: No results  found.   PMFS History: Patient Active Problem List   Diagnosis Date Noted   Diabetes mellitus type 2 with complications (HCC) 02/05/2022   Screening for lung cancer 06/13/2021   Need for pneumococcal vaccination 05/30/2020   Colon cancer screening 05/30/2020   Hyperlipidemia 09/21/2019   Nasal drainage 09/08/2019   Snoring 09/08/2019   Fatigue 09/08/2019   Sleep disturbance 09/08/2019   Ill-fitting dentures 09/08/2019   Vaccine counseling 09/22/2017   Influenza vaccination declined 09/22/2017   Right arm pain 02/26/2017   Vitamin D deficiency 07/09/2016   Screening for cervical cancer 07/09/2016   Pain in both thighs 06/23/2016   Decreased pedal pulses 06/23/2016   Encounter for health maintenance examination in adult 06/06/2015   Tobacco use disorder 06/06/2015   Impaired fasting blood sugar 06/06/2015   Screening for breast cancer 06/06/2015   Routine general medical examination at a health care facility 06/06/2015   Right shoulder pain 06/06/2015   Impacted cerumen of both ears 06/06/2015   Essential hypertension 04/17/2014   Goiter 04/17/2014   Obesity 04/17/2014   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   Past Medical History:  Diagnosis Date   Decreased pulse    normal ABIs 06/2016   Full dentures 08/2018  GERD (gastroesophageal reflux disease)    intermittent   Goiter    Hyperlipidemia    Hypertension    Obesity    Tobacco use    Vitamin D deficiency    Wears contact lenses     Family History  Problem Relation Age of Onset   Diabetes Mother    Hypertension Mother    Kidney disease Mother    Anemia Mother    Hypertension Father    Diabetes Father    Heart disease Father    Hypertension Sister    Stroke Maternal Aunt    Cancer Neg Hx    Colon cancer Neg Hx     Past Surgical History:  Procedure Laterality Date   BREAST CYST ASPIRATION Right 2017   cyst    CHOLECYSTECTOMY     COLONOSCOPY  09/2015   tubular adenoma, repeat 5 years;  Dr. Marsa Aris    COLPOSCOPY     history of abnormal pap in remote past   STRABISMUS SURGERY     childhood   Social History   Occupational History   Not on file  Tobacco Use   Smoking status: Some Days    Packs/day: 0.25    Years: 27.00    Additional pack years: 0.00    Total pack years: 6.75    Types: Cigarettes   Smokeless tobacco: Never  Vaping Use   Vaping Use: Never used  Substance and Sexual Activity   Alcohol use: Yes    Alcohol/week: 3.0 standard drinks of alcohol    Types: 1 Glasses of wine, 1 Cans of beer, 1 Shots of liquor per week    Comment: 4 weekly   Drug use: No   Sexual activity: Not on file

## 2022-07-04 ENCOUNTER — Ambulatory Visit: Payer: BC Managed Care – PPO | Admitting: Orthopaedic Surgery

## 2022-07-05 NOTE — Telephone Encounter (Signed)
P.A. hydrocodone/acet completed

## 2022-07-12 NOTE — Telephone Encounter (Signed)
P.A. approved but only for  a month, sent mychart message

## 2022-07-15 ENCOUNTER — Other Ambulatory Visit (INDEPENDENT_AMBULATORY_CARE_PROVIDER_SITE_OTHER): Payer: BC Managed Care – PPO

## 2022-07-15 ENCOUNTER — Encounter: Payer: Self-pay | Admitting: Orthopaedic Surgery

## 2022-07-15 ENCOUNTER — Ambulatory Visit (INDEPENDENT_AMBULATORY_CARE_PROVIDER_SITE_OTHER): Payer: BC Managed Care – PPO | Admitting: Orthopaedic Surgery

## 2022-07-15 ENCOUNTER — Other Ambulatory Visit: Payer: Self-pay | Admitting: Medical

## 2022-07-15 DIAGNOSIS — G8929 Other chronic pain: Secondary | ICD-10-CM

## 2022-07-15 DIAGNOSIS — M25511 Pain in right shoulder: Secondary | ICD-10-CM

## 2022-07-15 MED ORDER — BUPIVACAINE HCL 0.5 % IJ SOLN
3.0000 mL | INTRAMUSCULAR | Status: AC | PRN
Start: 2022-07-15 — End: 2022-07-15
  Administered 2022-07-15: 3 mL via INTRA_ARTICULAR

## 2022-07-15 MED ORDER — METHYLPREDNISOLONE ACETATE 40 MG/ML IJ SUSP
40.0000 mg | INTRAMUSCULAR | Status: AC | PRN
Start: 2022-07-15 — End: 2022-07-15
  Administered 2022-07-15: 40 mg via INTRA_ARTICULAR

## 2022-07-15 MED ORDER — LIDOCAINE HCL 1 % IJ SOLN
3.0000 mL | INTRAMUSCULAR | Status: AC | PRN
Start: 2022-07-15 — End: 2022-07-15
  Administered 2022-07-15: 3 mL

## 2022-07-15 NOTE — Progress Notes (Signed)
Office Visit Note   Patient: Ariel Sweeney           Date of Birth: 11-Apr-1965           MRN: [id number] Visit Date: 07/15/2022              Requested by: Jac Canavan, PA-C [address] [place],  [place] [address] PCP: Jac Canavan, PA-C   Assessment & Plan: Visit Diagnoses:  1. Chronic right shoulder pain     Plan: Ariel Sweeney is a 57 year old female with chronic right shoulder pain.  Etiology is unclear but I think that it is along the lines of rotator cuff tendinopathy possibly glenohumeral osteoarthritis.  She is not really demonstrating any signs of impingement or bursitis.  AC joint seems to be fine.  We will try subacromial injection today and I have provided some home exercises for her.  She will follow-up if symptoms persist.  Follow-Up Instructions: No follow-ups on file.   Orders:  Orders Placed This Encounter  Procedures   XR Shoulder Right   No orders of the defined types were placed in this encounter.     Procedures: Large Joint Inj: R subacromial bursa on 07/15/2022 10:39 AM Indications: pain Details: 22 G needle  Arthrogram: No  Medications: 3 mL lidocaine 1 %; 3 mL bupivacaine 0.5 %; 40 mg methylPREDNISolone acetate 40 MG/ML Outcome: tolerated well, no immediate complications Consent was given by the patient. Patient was prepped and draped in the usual sterile fashion.       Clinical Data: No additional findings.   Subjective: Chief Complaint  Patient presents with   Right Shoulder - Pain    HPI Ariel Sweeney is a 57 year old female here for evaluation of chronic right shoulder pain for couple years.  She states that the pain is worse with use of the arm and with activity.  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  Denies any specific injuries or recent changes in activity.  She feels that the pain is 2 out of 10 during the day but 9 out of 10 at night.  Denies any radicular symptoms.  Currently  works at [employer] on [address].  Left-hand-dominant.  Her PCP gave her prescription for hydrocodone for the pain but this caused constipation.  Currently taking Tylenol.  She has trouble sleeping due to the pain.  Review of Systems  Constitutional: Negative.   HENT: Negative.    Eyes: Negative.   Respiratory: Negative.    Cardiovascular: Negative.   Endocrine: Negative.   Musculoskeletal: Negative.   Neurological: Negative.   Hematological: Negative.   Psychiatric/Behavioral: Negative.    All other systems reviewed and are negative.    Objective: Vital Signs: There were no vitals taken for this visit.  Physical Exam Vitals and nursing note reviewed.  Constitutional:      Appearance: She is well-developed.  HENT:     Head: Atraumatic.     Nose: Nose normal.  Eyes:     Extraocular Movements: Extraocular movements intact.  Cardiovascular:     Pulses: Normal pulses.  Pulmonary:     Effort: Pulmonary effort is normal.  Abdominal:     Palpations: Abdomen is soft.  Musculoskeletal:     Cervical back: Neck supple.  Skin:    General: Skin is warm.     Capillary Refill: Capillary refill takes less than 2 seconds.  Neurological:     Mental Status: She  is alert. Mental status is at baseline.  Psychiatric:        Behavior: Behavior normal.        Thought Content: Thought content normal.        Judgment: Judgment normal.     Ortho Exam On examination right shoulder shows full active and passive range of motion with little to no pain.  Manual muscle testing the rotator cuff shows good strength with minimal pain.  No pain with impingement testing.  AC joint is nontender. Specialty Comments:  No specialty comments available.  Imaging: No results found.   PMFS History: Patient Active Problem List   Diagnosis Date Noted   Diabetes mellitus type 2 with complications (HCC) 02/05/2022   Screening for lung cancer 06/13/2021   Need for pneumococcal vaccination  05/30/2020   Colon cancer screening 05/30/2020   Hyperlipidemia 09/21/2019   Nasal drainage 09/08/2019   Snoring 09/08/2019   Fatigue 09/08/2019   Sleep disturbance 09/08/2019   Ill-fitting dentures 09/08/2019   Vaccine counseling 09/22/2017   Influenza vaccination declined 09/22/2017   Right arm pain 02/26/2017   Vitamin D deficiency 07/09/2016   Screening for cervical cancer 07/09/2016   Pain in both thighs 06/23/2016   Decreased pedal pulses 06/23/2016   Encounter for health maintenance examination in adult 06/06/2015   Tobacco use disorder 06/06/2015   Impaired fasting blood sugar 06/06/2015   Screening for breast cancer 06/06/2015   Routine general medical examination at a health care facility 06/06/2015   Right shoulder pain 06/06/2015   Impacted cerumen of both ears 06/06/2015   Essential hypertension 04/17/2014   Goiter 04/17/2014   Obesity 04/17/2014   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   Past Medical History:  Diagnosis Date   Decreased pulse    normal ABIs 06/2016   Full dentures 08/2018   GERD (gastroesophageal reflux disease)    intermittent   Goiter    Hyperlipidemia    Hypertension    Obesity    Tobacco use    Vitamin D deficiency    Wears contact lenses     Family History  Problem Relation Age of Onset   Diabetes Mother    Hypertension Mother    Kidney disease Mother    Anemia Mother    Hypertension Father    Diabetes Father    Heart disease Father    Hypertension Sister    Stroke Maternal Aunt    Cancer Neg Hx    Colon cancer Neg Hx     Past Surgical History:  Procedure Laterality Date   BREAST CYST ASPIRATION Right 2017   cyst    CHOLECYSTECTOMY     COLONOSCOPY  09/2015   tubular adenoma, repeat 5 years;  Dr. Marsa Aris   COLPOSCOPY     history of abnormal pap in remote past   STRABISMUS SURGERY     childhood   Social History   Occupational History   Not on file  Tobacco Use   Smoking status: Some Days    Packs/day: 0.25    Years:  27.00    Additional pack years: 0.00    Total pack years: 6.75    Types: Cigarettes   Smokeless tobacco: Never  Vaping Use   Vaping Use: Never used  Substance and Sexual Activity   Alcohol use: Yes    Alcohol/week: 3.0 standard drinks of alcohol    Types: 1 Glasses of wine, 1 Cans of beer, 1 Shots of liquor per week    Comment: 4 weekly  Drug use: No   Sexual activity: Not on file

## 2022-07-21 ENCOUNTER — Telehealth: Payer: Self-pay | Admitting: Medical

## 2022-07-21 NOTE — Telephone Encounter (Signed)
Left message for pt to call me back 

## 2022-07-21 NOTE — Telephone Encounter (Signed)
Pt called waiting on PA for  Ozempic .5 Will miss dose, wanted to know if we have sample

## 2022-07-21 NOTE — Telephone Encounter (Signed)
We do not have samples please advise. Vernona Rieger is on vacation this week and no PA are being done

## 2022-07-22 NOTE — Telephone Encounter (Signed)
Pt will come by and pick up sample of rybelsus. Pt was notified that when laura gets back In town she will do her PA

## 2022-08-13 NOTE — Telephone Encounter (Signed)
PT ASSISTANCE OZEMPIC application completed & faxed

## 2022-08-15 ENCOUNTER — Telehealth: Payer: Self-pay | Admitting: Radiology

## 2022-08-15 NOTE — Telephone Encounter (Signed)
Patient called, said the injection has not lasted, and she is still in terrible pain.  Please call her to discuss.

## 2022-08-19 ENCOUNTER — Other Ambulatory Visit: Payer: Self-pay

## 2022-08-19 DIAGNOSIS — G8929 Other chronic pain: Secondary | ICD-10-CM

## 2022-08-19 NOTE — Telephone Encounter (Signed)
Let's order MRI.  Thanks.

## 2022-08-19 NOTE — Telephone Encounter (Signed)
Pt called back stating she got a missed call from [place] I informed her that it was regarding her shoulder and being we already did an injection we were going to order a MRI and she stated she is okay with that and will look out for the call, order should be ready to be placed

## 2022-08-19 NOTE — Telephone Encounter (Signed)
Order has been placed.

## 2022-08-19 NOTE — Telephone Encounter (Signed)
noted 

## 2022-09-05 ENCOUNTER — Other Ambulatory Visit: Payer: Self-pay | Admitting: Medical

## 2022-09-06 ENCOUNTER — Other Ambulatory Visit: Payer: Self-pay | Admitting: Medical

## 2022-09-24 NOTE — Telephone Encounter (Signed)
PAP denied by [employer] due to [employer].  Ariel Sweeney can you help this patient, (she does have insurance but has a $3500 deductible & can't afford $958 for this medication).  Thanks

## 2022-09-26 NOTE — Progress Notes (Signed)
09/26/2022  Patient ID: Ariel Sweeney, female   DOB: Oct 03, 1965, 57 y.o.   MRN: [id number]  Clinic routed request to assist with affordability of Ozempic.  Patient was denied Novo PAP based on having commercial insurance coverage.  Current plan has a $3500 deductible, and a prior authorization is required for Ozempic.  I have submitted the prior authorization request through CoverMyMeds.  If approved, can provide a copay card to pharmacy to process.  If medication still not affordable, I can see if another GLP1 agent may be more affordable on plan with a copay card.  I will keep patient and provider notified.  Lenna Gilford, PharmD, DPLA

## 2022-09-28 ENCOUNTER — Ambulatory Visit
Admission: RE | Admit: 2022-09-28 | Discharge: 2022-09-28 | Disposition: A | Discharge status: Home / Self Care | Payer: BC Managed Care – PPO | Source: Ambulatory Visit | Attending: Orthopaedic Surgery | Admitting: Orthopaedic Surgery

## 2022-09-28 DIAGNOSIS — M25511 Pain in right shoulder: Secondary | ICD-10-CM | POA: Diagnosis not present

## 2022-09-28 DIAGNOSIS — G8929 Other chronic pain: Secondary | ICD-10-CM

## 2022-09-28 DIAGNOSIS — S46011A Strain of muscle(s) and tendon(s) of the rotator cuff of right shoulder, initial encounter: Secondary | ICD-10-CM | POA: Diagnosis not present

## 2022-09-28 DIAGNOSIS — M19011 Primary osteoarthritis, right shoulder: Secondary | ICD-10-CM | POA: Diagnosis not present

## 2022-09-29 ENCOUNTER — Telehealth: Payer: Self-pay

## 2022-09-29 NOTE — Progress Notes (Signed)
09/29/2022  Patient ID: Ariel Sweeney, female   DOB: 1965-08-26, 57 y.o.   MRN: [id number]  Prior authorization for Ozempic was approved by patient's plan today.  Contacted pharmacy to process claim, and a 1 month copay would be $313.  Copay card is available for this medication, but it would only take the cost down by $150.  Same for Trulicity, and Mounjaro and Saxenda are not covered by the plan.  Contacted patient to assess affordability of Ozempic with insurance coverage and copay card.  She will not be able to afford this medication.  Last A1c was in February and was 6.3.  I recommend follow-up A1c; and if >7%, consider metformin xr 500mg  BID.  Lenna Gilford, PharmD, DPLA

## 2022-10-05 NOTE — Progress Notes (Signed)
P.A. Franki Monte approved til 09/26/23

## 2022-10-07 ENCOUNTER — Other Ambulatory Visit: Payer: Self-pay | Admitting: Medical

## 2022-10-10 ENCOUNTER — Ambulatory Visit: Payer: BC Managed Care – PPO | Admitting: Medical

## 2022-10-10 NOTE — Progress Notes (Signed)
Needs f/u appt please.  Thanks.

## 2022-10-11 ENCOUNTER — Other Ambulatory Visit: Payer: Self-pay | Admitting: Medical

## 2022-10-12 ENCOUNTER — Other Ambulatory Visit: Payer: Self-pay | Admitting: Medical

## 2022-10-16 ENCOUNTER — Encounter: Payer: Self-pay | Admitting: Medical

## 2022-10-16 ENCOUNTER — Ambulatory Visit: Payer: BC Managed Care – PPO | Admitting: Orthopaedic Surgery

## 2022-10-16 ENCOUNTER — Ambulatory Visit (INDEPENDENT_AMBULATORY_CARE_PROVIDER_SITE_OTHER): Payer: BC Managed Care – PPO | Admitting: Medical

## 2022-10-16 ENCOUNTER — Encounter: Payer: Self-pay | Admitting: Orthopaedic Surgery

## 2022-10-16 VITALS — BP 130/86 | HR 70 | Ht 62.0 in | Wt 182.2 lb

## 2022-10-16 DIAGNOSIS — Z124 Encounter for screening for malignant neoplasm of cervix: Secondary | ICD-10-CM

## 2022-10-16 DIAGNOSIS — Z0000 Encounter for general adult medical examination without abnormal findings: Secondary | ICD-10-CM | POA: Diagnosis not present

## 2022-10-16 DIAGNOSIS — R809 Proteinuria, unspecified: Secondary | ICD-10-CM

## 2022-10-16 DIAGNOSIS — E118 Type 2 diabetes mellitus with unspecified complications: Secondary | ICD-10-CM

## 2022-10-16 DIAGNOSIS — Z7185 Encounter for immunization safety counseling: Secondary | ICD-10-CM | POA: Diagnosis not present

## 2022-10-16 DIAGNOSIS — Z1231 Encounter for screening mammogram for malignant neoplasm of breast: Secondary | ICD-10-CM

## 2022-10-16 DIAGNOSIS — M25511 Pain in right shoulder: Secondary | ICD-10-CM

## 2022-10-16 DIAGNOSIS — E049 Nontoxic goiter, unspecified: Secondary | ICD-10-CM

## 2022-10-16 DIAGNOSIS — M75121 Complete rotator cuff tear or rupture of right shoulder, not specified as traumatic: Secondary | ICD-10-CM

## 2022-10-16 DIAGNOSIS — H0520 Unspecified exophthalmos: Secondary | ICD-10-CM | POA: Diagnosis not present

## 2022-10-16 DIAGNOSIS — I10 Essential (primary) hypertension: Secondary | ICD-10-CM

## 2022-10-16 DIAGNOSIS — Z1211 Encounter for screening for malignant neoplasm of colon: Secondary | ICD-10-CM | POA: Diagnosis not present

## 2022-10-16 DIAGNOSIS — Z122 Encounter for screening for malignant neoplasm of respiratory organs: Secondary | ICD-10-CM

## 2022-10-16 DIAGNOSIS — M7541 Impingement syndrome of right shoulder: Secondary | ICD-10-CM | POA: Diagnosis not present

## 2022-10-16 DIAGNOSIS — F17200 Nicotine dependence, unspecified, uncomplicated: Secondary | ICD-10-CM

## 2022-10-16 DIAGNOSIS — E559 Vitamin D deficiency, unspecified: Secondary | ICD-10-CM | POA: Diagnosis not present

## 2022-10-16 DIAGNOSIS — E785 Hyperlipidemia, unspecified: Secondary | ICD-10-CM

## 2022-10-16 DIAGNOSIS — Z136 Encounter for screening for cardiovascular disorders: Secondary | ICD-10-CM

## 2022-10-16 LAB — POCT URINALYSIS DIP (PROADVANTAGE DEVICE)
Bilirubin, UA: NEGATIVE
Blood, UA: NEGATIVE
Glucose, UA: NEGATIVE mg/dL
Ketones, POC UA: NEGATIVE mg/dL
Leukocytes, UA: NEGATIVE
Nitrite, UA: NEGATIVE
Protein Ur, POC: 30 mg/dL — AB
Specific Gravity, Urine: 1.015
Urobilinogen, Ur: 0.2
pH, UA: 6.5

## 2022-10-16 LAB — POCT GLYCOSYLATED HEMOGLOBIN (HGB A1C): Hemoglobin A1C: 6.7 % — AB (ref 4.0–5.6)

## 2022-10-16 NOTE — Progress Notes (Signed)
Subjective:   HPI  Ariel Sweeney is a 57 y.o. female who presents for Chief Complaint  Patient presents with   Diabetes    Patient is here for diabetes follow up. Saw Dr. Roda Shutters this am for right shoulder, rotator cuff teat 13mm.     Patient Care Team: Myrah Strawderman, Cleda Mccreedy as PCP - General (Family Medicine) Sees dentist Sees eye doctor Dr. Marsa Aris, GI Dr. Gershon Mussel, ortho Sabino Niemann, [facility] pharmacy   Concerns: Compliant with medications for blood pressure and cholesterol  She has recently been seeing orthopedics about her shoulder.  She will likely be pursuing surgery for rotator cuff damage.  She continues to smoke   Gynecological history: 2 pregnancies, 1 live births, 1 miscarriage   Allergies  Allergen Reactions   Influenza Vaccines     Claims illness from flu shot    Past Medical History:  Diagnosis Date   Decreased pulse    normal ABIs 06/2016   Full dentures 08/2018   GERD (gastroesophageal reflux disease)    intermittent   Goiter    Hyperlipidemia    Hypertension    Obesity    Tobacco use    Vitamin D deficiency    Wears contact lenses     Current Outpatient Medications on File Prior to Visit  Medication Sig Dispense Refill   amLODipine (NORVASC) 10 MG tablet Take 1 tablet by mouth once daily 90 tablet 0   aspirin EC (EQ ASPIRIN ADULT LOW DOSE) 81 MG tablet Take 1 tablet (81 mg total) by mouth daily. Swallow whole. 90 tablet 3   Cholecalciferol (VITAMIN D) 50 MCG (2000 UT) CAPS Take 1 capsule (2,000 Units total) by mouth daily. 90 capsule 3   cyanocobalamin 500 MCG tablet Take 500 mcg by mouth every other day.     enalapril (VASOTEC) 20 MG tablet Take 1 tablet by mouth once daily 30 tablet 2   hydrochlorothiazide (HYDRODIURIL) 25 MG tablet Take 1 tablet (25 mg total) by mouth daily. 90 tablet 3   potassium chloride (KLOR-CON) 10 MEQ tablet Take 1 tablet by mouth twice daily 180 tablet 0   rosuvastatin (CRESTOR) 20 MG tablet TAKE 1 TABLET  BY MOUTH AT BEDTIME 90 tablet 0   diphenhydramine-acetaminophen (TYLENOL PM) 25-500 MG TABS tablet Take 2 tablets by mouth at bedtime as needed. (Patient not taking: Reported on 10/16/2022)     ferrous gluconate (FERGON) 324 MG tablet Take 1 tablet by mouth once daily with breakfast (Patient not taking: Reported on 10/16/2022) 90 tablet 1   No current facility-administered medications on file prior to visit.      Current Outpatient Medications:    amLODipine (NORVASC) 10 MG tablet, Take 1 tablet by mouth once daily, Disp: 90 tablet, Rfl: 0   aspirin EC (EQ ASPIRIN ADULT LOW DOSE) 81 MG tablet, Take 1 tablet (81 mg total) by mouth daily. Swallow whole., Disp: 90 tablet, Rfl: 3   Cholecalciferol (VITAMIN D) 50 MCG (2000 UT) CAPS, Take 1 capsule (2,000 Units total) by mouth daily., Disp: 90 capsule, Rfl: 3   cyanocobalamin 500 MCG tablet, Take 500 mcg by mouth every other day., Disp: , Rfl:    enalapril (VASOTEC) 20 MG tablet, Take 1 tablet by mouth once daily, Disp: 30 tablet, Rfl: 2   hydrochlorothiazide (HYDRODIURIL) 25 MG tablet, Take 1 tablet (25 mg total) by mouth daily., Disp: 90 tablet, Rfl: 3   potassium chloride (KLOR-CON) 10 MEQ tablet, Take 1 tablet by mouth twice daily,  Disp: 180 tablet, Rfl: 0   rosuvastatin (CRESTOR) 20 MG tablet, TAKE 1 TABLET BY MOUTH AT BEDTIME, Disp: 90 tablet, Rfl: 0   diphenhydramine-acetaminophen (TYLENOL PM) 25-500 MG TABS tablet, Take 2 tablets by mouth at bedtime as needed. (Patient not taking: Reported on 10/16/2022), Disp: , Rfl:    ferrous gluconate (FERGON) 324 MG tablet, Take 1 tablet by mouth once daily with breakfast (Patient not taking: Reported on 10/16/2022), Disp: 90 tablet, Rfl: 1  Family History  Problem Relation Age of Onset   Diabetes Mother    Hypertension Mother    Kidney disease Mother    Anemia Mother    Hypertension Father    Diabetes Father    Heart disease Father    Hypertension Sister    Stroke Maternal Aunt    Cancer Neg Hx     Colon cancer Neg Hx     Past Surgical History:  Procedure Laterality Date   BREAST CYST ASPIRATION Right 2017   cyst    CHOLECYSTECTOMY     COLONOSCOPY  09/2015   tubular adenoma, repeat 5 years;  Dr. Marsa Aris   COLPOSCOPY     history of abnormal pap in remote past   STRABISMUS SURGERY     childhood    Reviewed their medical, surgical, family, social, medication, and allergy history and updated chart as appropriate.   Review of Systems  Constitutional:  Negative for chills, fever, malaise/fatigue and weight loss.  HENT:  Negative for congestion, ear pain, hearing loss, sore throat and tinnitus.   Eyes:  Negative for blurred vision, pain and redness.  Respiratory:  Negative for cough, hemoptysis and shortness of breath.   Cardiovascular:  Negative for chest pain, palpitations, orthopnea, claudication and leg swelling.  Gastrointestinal:  Negative for abdominal pain, blood in stool, constipation, diarrhea, nausea and vomiting.  Genitourinary:  Negative for dysuria, flank pain, frequency, hematuria and urgency.  Musculoskeletal:  Positive for joint pain. Negative for falls and myalgias.  Skin:  Negative for itching and rash.  Neurological:  Negative for dizziness, tingling, speech change, weakness and headaches.  Endo/Heme/Allergies:  Negative for polydipsia. Does not bruise/bleed easily.  Psychiatric/Behavioral:  Negative for depression and memory loss. The patient is not nervous/anxious and does not have insomnia.        Objective:  BP 130/86   Pulse 70   Ht 5\' 2"  (1.575 m)   Wt 182 lb 3.2 oz (82.6 kg)   SpO2 98%   BMI 33.32 kg/m   Wt Readings from Last 3 Encounters:  10/16/22 182 lb 3.2 oz (82.6 kg)  06/24/22 180 lb 3.2 oz (81.7 kg)  03/10/22 179 lb 9.6 oz (81.5 kg)    General appearance: alert, no distress, WD/WN, African American female Skin: Keloid scars on right deltoid mid upper chest, no worrisome lesion HEENT: normocephalic, conjunctiva/corneas  normal, sclerae anicteric, exophthalmos noted  PERRLA, EOMi, nares with turbinate edema moderately both sides, some dryness on the left, otherwise unremarkable, no discharge or erythema, pharynx normal Oral cavity: MMM, tongue normal, missing some teeth Neck: supple, no lymphadenopathy, firmness of thyroid in general, no distinct nodules, no masses, normal ROM, no bruits Chest: non tender, normal shape and expansion Heart: RRR, normal S1, S2, no murmurs Lungs: CTA bilaterally, no wheezes, rhonchi, or rales Abdomen: Port surgical scars noted of the abdomen, +bs, soft, non tender, non distended, no masses, no hepatomegaly, no splenomegaly, no bruits Back: non tender, normal ROM, no scoliosis Musculoskeletal: upper extremities non tender, no obvious  deformity, normal ROM throughout, lower extremities non tender, no obvious deformity, normal ROM throughout Extremities: no edema, no cyanosis, no clubbing Pulses: 2+ symmetric, upper and lower extremities, normal cap refill Neurological: alert, oriented x 3, CN2-12 intact, strength normal upper extremities and lower extremities, sensation normal throughout, DTRs 2+ throughout, no cerebellar signs, gait normal Psychiatric: normal affect, behavior normal, pleasant  Breast/pelvic  - declined    Assessment and Plan :   Encounter Diagnoses  Name Primary?   Encounter for health maintenance examination in adult Yes   Diabetes mellitus type 2 with complications (HCC)    Vaccine counseling    Screening for colon cancer    Essential hypertension    Exophthalmos    Goiter    Hyperlipidemia, unspecified hyperlipidemia type    Vitamin D deficiency    Tobacco use disorder    Screening for lung cancer    Encounter for screening mammogram for malignant neoplasm of breast    Screening for cervical cancer    Right shoulder pain, unspecified chronicity    Colon cancer screening    Microalbuminuria    Screening for heart disease     This visit was a  preventative care visit, also known as wellness visit or routine physical.   Topics typically include healthy lifestyle, diet, exercise, preventative care, vaccinations, sick and well care, proper use of emergency dept and after hours care, as well as other concerns.     Recommendations: Continue to return yearly for your annual wellness and preventative care visits.  This gives [place] a chance to discuss healthy lifestyle, exercise, vaccinations, review your chart record, and perform screenings where appropriate.  I recommend you see your eye doctor yearly for routine vision care.  I recommend you see your dentist yearly for routine dental care including hygiene visits twice yearly.   Vaccination recommendations were reviewed Immunization History  Administered Date(s) Administered   PFIZER(Purple Top)SARS-COV-2 Vaccination 11/14/2019, 12/05/2019   PPD Test 10/05/2020   Pneumococcal Polysaccharide-23 04/17/2014, 05/30/2020   Tdap 06/06/2015   Advised shingrix.  She declines   Screening for cancer: Colon cancer screening: Referral back for updated colonoscopy   Breast cancer screening: You should perform a self breast exam monthly.   We reviewed recommendations for regular mammograms and breast cancer screening.  Cervical cancer screening: We reviewed recommendations for pap smear screening.   Skin cancer screening: Check your skin regularly for new changes, growing lesions, or other lesions of concern Come in for evaluation if you have skin lesions of concern.  Lung cancer screening: If you have a greater than 20 pack year history of tobacco use, then you may qualify for lung cancer screening with a chest CT scan.   Please call your insurance company to inquire about coverage for this test.  We currently don't have screenings for other cancers besides breast, cervical, colon, and lung cancers.  If you have a strong family history of cancer or have other cancer screening concerns,  please let me know.    Bone health: Get at least 150 minutes of aerobic exercise weekly Get weight bearing exercise at least once weekly Bone density test:  A bone density test is an imaging test that uses a type of X-ray to measure the amount of calcium and other minerals in your bones. The test may be used to diagnose or screen you for a condition that causes weak or thin bones (osteoporosis), predict your risk for a broken bone (fracture), or determine how well your osteoporosis  treatment is working. The bone density test is recommended for females 65 and older, or females or males <65 if certain risk factors such as thyroid disease, long term use of steroids such as for asthma or rheumatological issues, vitamin D deficiency, estrogen deficiency, family history of osteoporosis, self or family history of fragility fracture in first degree relative.    Heart health: Get at least 150 minutes of aerobic exercise weekly Limit alcohol It is important to maintain a healthy blood pressure and healthy cholesterol numbers  Heart disease screening: Screening for heart disease includes screening for blood pressure, fasting lipids, glucose/diabetes screening, BMI height to weight ratio, reviewed of smoking status, physical activity, and diet.    Goals include blood pressure 120/80 or less, maintaining a healthy lipid/cholesterol profile, preventing diabetes or keeping diabetes numbers under good control, not smoking or using tobacco products, exercising most days per week or at least 150 minutes per week of exercise, and eating healthy variety of fruits and vegetables, healthy oils, and avoiding unhealthy food choices like fried food, fast food, high sugar and high cholesterol foods.    Other tests may possibly include EKG test, CT coronary calcium score, echocardiogram, exercise treadmill stress test.   Referral placed for CT coronary test    Medical care options: I recommend you continue to  seek care here first for routine care.  We try really hard to have available appointments Monday through Friday daytime hours for sick visits, acute visits, and physicals.  Urgent care should be used for after hours and weekends for significant issues that cannot wait till the next day.  The emergency department should be used for significant potentially life-threatening emergencies.  The emergency department is expensive, can often have long wait times for less significant concerns, so try to utilize primary care, urgent care, or telemedicine when possible to avoid unnecessary trips to the emergency department.  Virtual visits and telemedicine have been introduced since the pandemic started in 2020, and can be convenient ways to receive medical care.  We offer virtual appointments as well to assist you in a variety of options to seek medical care.   Advanced Directives: I recommend you consider completing a Health Care Power of Attorney and Living Will.   These documents respect your wishes and help alleviate burdens on your loved ones if you were to become terminally ill or be in a position to need those documents enforced.    You can complete Advanced Directives yourself, have them notarized, then have copies made for our office, for you and for anybody you feel should have them in safe keeping.  Or, you can have an attorney prepare these documents.   If you haven't updated your Last Will and Testament in a while, it may be worthwhile having an attorney prepare these documents together and save on some costs.         Other specific issues: Hypertension-continue current medications, continue amlodipine 10 mg daily, enalapril 20 mg daily, HCTZ 25 mg daily.  Labs today  hyperlipidemia-continue current medication, Crestor 20 mg daily, aspirin 81 mg daily , labs today  Diabetes-not currently on medication.  Labs today  Smoker-she is not ready to quit.  Still smokes 4 to 5 cigarettes/day.  Goiter  - consider updated imaging  Right shoulder pain, rotator cuff issue-follow-up with orthopedics as planned, will likely be pursuing surgery   Ariel Sweeney was seen today for diabetes.  Diagnoses and all orders for this visit:  Encounter for health maintenance examination  in adult -     MM 3D SCREENING MAMMOGRAM BILATERAL BREAST -     CT CARDIAC SCORING (SELF PAY ONLY); Future -     Ambulatory referral to Gastroenterology -     POCT Urinalysis DIP (Proadvantage Device) -     Comprehensive metabolic panel -     TSH + free T4 -     Lipid panel -     VITAMIN D 25 Hydroxy (Vit-D Deficiency, Fractures)  Diabetes mellitus type 2 with complications (HCC) -     HgB A1c  Vaccine counseling  Screening for colon cancer -     Ambulatory referral to Gastroenterology  Essential hypertension -     CT CARDIAC SCORING (SELF PAY ONLY); Future  Exophthalmos -     TSH + free T4  Goiter -     TSH + free T4  Hyperlipidemia, unspecified hyperlipidemia type -     Lipid panel  Vitamin D deficiency -     VITAMIN D 25 Hydroxy (Vit-D Deficiency, Fractures)  Tobacco use disorder  Screening for lung cancer  Encounter for screening mammogram for malignant neoplasm of breast -     MM 3D SCREENING MAMMOGRAM BILATERAL BREAST  Screening for cervical cancer  Right shoulder pain, unspecified chronicity  Colon cancer screening  Microalbuminuria  Screening for heart disease -     CT CARDIAC SCORING (SELF PAY ONLY); Future   Follow-up pending labs, yearly for physical

## 2022-10-16 NOTE — Progress Notes (Signed)
Office Visit Note   Patient: Ariel Sweeney           Date of Birth: Feb 20, 1965           MRN: [id number] Visit Date: 10/16/2022              Requested by: Jac Canavan, PA-C [address] [place],  [place] [address] PCP: Jac Canavan, PA-C   Assessment & Plan: Visit Diagnoses:  1. Nontraumatic complete tear of right rotator cuff   2. Impingement syndrome of right shoulder     Plan: Ariel Sweeney is a 56 year old female with full-thickness tear of the supraspinatus and impingement syndrome.  MRI findings were reviewed with the patient treatment options were discussed.  I have recommended arthroscopic surgical repair of the supraspinatus as well as extensive debridement and subacromial decompression.  Details of the surgery including risk benefits prognosis reviewed with the patient.  Eunice Blase will call patient to confirm surgery time.  Follow-Up Instructions: No follow-ups on file.   Orders:  No orders of the defined types were placed in this encounter.  No orders of the defined types were placed in this encounter.     Procedures: No procedures performed   Clinical Data: No additional findings.   Subjective: Chief Complaint  Patient presents with   Right Shoulder - Follow-up    MRI review    HPI Ariel Sweeney returns today to discuss right shoulder MRI scan.  She reports continued pain and weakness with right shoulder function.  Review of Systems  Constitutional: Negative.   HENT: Negative.    Eyes: Negative.   Respiratory: Negative.    Cardiovascular: Negative.   Endocrine: Negative.   Musculoskeletal: Negative.   Neurological: Negative.   Hematological: Negative.   Psychiatric/Behavioral: Negative.    All other systems reviewed and are negative.    Objective: Vital Signs: There were no vitals taken for this visit.  Physical Exam Vitals and nursing note reviewed.  Constitutional:      Appearance: She is well-developed.  HENT:     Head: Normocephalic  and atraumatic.  Pulmonary:     Effort: Pulmonary effort is normal.  Abdominal:     Palpations: Abdomen is soft.  Musculoskeletal:     Cervical back: Neck supple.  Skin:    General: Skin is warm.     Capillary Refill: Capillary refill takes less than 2 seconds.  Neurological:     Mental Status: She is alert and oriented to person, place, and time.  Psychiatric:        Behavior: Behavior normal.        Thought Content: Thought content normal.        Judgment: Judgment normal.     Ortho Exam Exam of the right shoulder shows pain and weakness with manual muscle testing of the supraspinatus.  Pain with impingement signs. Specialty Comments:  No specialty comments available.  Imaging: No results found.   PMFS History: Patient Active Problem List   Diagnosis Date Noted   Diabetes mellitus type 2 with complications (HCC) 02/05/2022   Screening for lung cancer 06/13/2021   Need for pneumococcal vaccination 05/30/2020   Colon cancer screening 05/30/2020   Hyperlipidemia 09/21/2019   Nasal drainage 09/08/2019   Snoring 09/08/2019   Fatigue 09/08/2019   Sleep disturbance 09/08/2019   Ill-fitting dentures 09/08/2019   Vaccine counseling 09/22/2017   Influenza vaccination declined 09/22/2017   Right arm pain 02/26/2017   Vitamin D deficiency 07/09/2016   Screening for cervical cancer  07/09/2016   Pain in both thighs 06/23/2016   Decreased pedal pulses 06/23/2016   Encounter for health maintenance examination in adult 06/06/2015   Tobacco use disorder 06/06/2015   Impaired fasting blood sugar 06/06/2015   Screening for breast cancer 06/06/2015   Routine general medical examination at a health care facility 06/06/2015   Right shoulder pain 06/06/2015   Impacted cerumen of both ears 06/06/2015   Essential hypertension 04/17/2014   Goiter 04/17/2014   Obesity 04/17/2014   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   Past Medical History:  Diagnosis Date   Decreased pulse    normal ABIs  06/2016   Full dentures 08/2018   GERD (gastroesophageal reflux disease)    intermittent   Goiter    Hyperlipidemia    Hypertension    Obesity    Tobacco use    Vitamin D deficiency    Wears contact lenses     Family History  Problem Relation Age of Onset   Diabetes Mother    Hypertension Mother    Kidney disease Mother    Anemia Mother    Hypertension Father    Diabetes Father    Heart disease Father    Hypertension Sister    Stroke Maternal Aunt    Cancer Neg Hx    Colon cancer Neg Hx     Past Surgical History:  Procedure Laterality Date   BREAST CYST ASPIRATION Right 2017   cyst    CHOLECYSTECTOMY     COLONOSCOPY  09/2015   tubular adenoma, repeat 5 years;  Dr. Marsa Aris   COLPOSCOPY     history of abnormal pap in remote past   STRABISMUS SURGERY     childhood   Social History   Occupational History   Not on file  Tobacco Use   Smoking status: Some Days    Current packs/day: 0.25    Average packs/day: 0.3 packs/day for 27.0 years (6.8 ttl pk-yrs)    Types: Cigarettes   Smokeless tobacco: Never  Vaping Use   Vaping status: Never Used  Substance and Sexual Activity   Alcohol use: Yes    Alcohol/week: 3.0 standard drinks of alcohol    Types: 1 Glasses of wine, 1 Cans of beer, 1 Shots of liquor per week    Comment: 4 weekly   Drug use: No   Sexual activity: Not on file

## 2022-10-17 ENCOUNTER — Other Ambulatory Visit: Payer: Self-pay | Admitting: Medical

## 2022-10-17 LAB — TSH+FREE T4
Free T4: 1.26 ng/dL (ref 0.82–1.77)
TSH: 1.4 u[IU]/mL (ref 0.450–4.500)

## 2022-10-17 LAB — LIPID PANEL
Chol/HDL Ratio: 2.7 {ratio} (ref 0.0–4.4)
Cholesterol, Total: 127 mg/dL (ref 100–199)
HDL: 47 mg/dL (ref >39)
LDL Chol Calc (NIH): 50 mg/dL (ref 0–99)
Triglycerides: 186 mg/dL — ABNORMAL HIGH (ref 0–149)
VLDL Cholesterol Cal: 30 mg/dL (ref 5–40)

## 2022-10-17 LAB — COMPREHENSIVE METABOLIC PANEL
ALT: 32 [IU]/L (ref 0–32)
AST: 28 [IU]/L (ref 0–40)
Albumin: 4.5 g/dL (ref 3.8–4.9)
Alkaline Phosphatase: 77 [IU]/L (ref 44–121)
BUN/Creatinine Ratio: 15 (ref 9–23)
BUN: 16 mg/dL (ref 6–24)
Bilirubin Total: 0.3 mg/dL (ref 0.0–1.2)
CO2: 26 mmol/L (ref 20–29)
Calcium: 10.2 mg/dL (ref 8.7–10.2)
Chloride: 99 mmol/L (ref 96–106)
Creatinine, Ser: 1.09 mg/dL — ABNORMAL HIGH (ref 0.57–1.00)
Globulin, Total: 2.7 g/dL (ref 1.5–4.5)
Glucose: 143 mg/dL — ABNORMAL HIGH (ref 70–99)
Potassium: 3.3 mmol/L — ABNORMAL LOW (ref 3.5–5.2)
Sodium: 141 mmol/L (ref 134–144)
Total Protein: 7.2 g/dL (ref 6.0–8.5)
eGFR: 59 mL/min/{1.73_m2} — ABNORMAL LOW (ref >59)

## 2022-10-17 LAB — VITAMIN D 25 HYDROXY (VIT D DEFICIENCY, FRACTURES): Vit D, 25-Hydroxy: 45.4 ng/mL (ref 30.0–100.0)

## 2022-10-17 MED ORDER — AMLODIPINE BESYLATE 10 MG PO TABS: 10.0000 mg | ORAL_TABLET | Freq: Every day | ORAL | 3 refills | Status: DC

## 2022-10-17 MED ORDER — ASPIRIN 81 MG PO TBEC: 81.0000 mg | DELAYED_RELEASE_TABLET | Freq: Every day | ORAL | 3 refills | Status: DC

## 2022-10-17 MED ORDER — VALSARTAN-HYDROCHLOROTHIAZIDE 160-12.5 MG PO TABS
1.0000 | ORAL_TABLET | Freq: Every day | ORAL | 0 refills | Status: DC
Start: 2022-10-17 — End: 2023-01-15

## 2022-10-17 MED ORDER — FENOFIBRATE 145 MG PO TABS
145.0000 mg | ORAL_TABLET | Freq: Every day | ORAL | 0 refills | Status: DC
Start: 2022-10-17 — End: 2023-10-17

## 2022-10-17 MED ORDER — ROSUVASTATIN CALCIUM 10 MG PO TABS: 10.0000 mg | ORAL_TABLET | Freq: Every day | ORAL | 2 refills | Status: DC

## 2022-10-17 NOTE — Progress Notes (Signed)
Kidney marker abnormal but stable showing chronic kidney disease, potassium little bit low.  Liver and electrolytes okay.  Triglycerides or fats are chronically high but the rest of cholesterol numbers look okay.  Thyroid okay.  Vitamin D looks good.  Expect a phone call about the CT coronary test.  Call to schedule your mammogram  I would like to change her blood pressure regimen, enalapril and HCTZ to valsartan HCT.  I think this may work little bit better since she has chronic kidney disease and a little protein in the urine.  Continue amlodipine as usual.  So if agreeable we would continue amlodipine but we would stop enalapril and hydrochlorothiazide and change to a combo valsartan HCT or similar depending on insurance coverage.  I think changing this would also help improve the potassium or we could possibly stop potassium  Avoid any type of anti-inflammatories such as ibuprofen, Aleve, Motrin or Advil as this can harm the kidney  I recommend another change, I recommend changing Crestor to a lower dose but adding fenofibrate to see if this would help the triglycerides better.  Having high triglycerides can put you at risk for pancreatitis and fatty liver disease.  Your LDL is actually quite low so we could actually back off the statin dose a little.  Your diabetes markers are holding steady so we will hold off on adding any additional medicines right now  I know this is a lot of information so if you need to come back to discuss further , that is fine.  If not let me know if agreeable to these changes  If we make these changes I want to see you back in 7 to 8 weeks fasting

## 2022-10-17 NOTE — Progress Notes (Signed)
Assuming everything goes through the pharmacy and no issues with insurance, here is the new regimen:  Amlodipine 10 mg daily for blood pressure (unchanged) Begin valsartan HCT 160/12.5 mg daily for blood pressure Change Crestor to a lower dose.  I sent the lower dose 10 mg.  If she already has 20 mg left she can cut them in half daily until she runs out Begin fenofibrate 145 mg daily along with the Crestor cholesterol medicine Continue aspirin 81 mg daily   Stop enalapril blood pressure pill Stop hydrochlorothiazide blood pressure pill Stop potassium

## 2022-10-20 ENCOUNTER — Telehealth: Payer: Self-pay | Admitting: Medical

## 2022-10-20 NOTE — Telephone Encounter (Signed)
P.A. FENOFIBRATE

## 2022-10-21 NOTE — Telephone Encounter (Signed)
Response states pt must try & fail formulary alternatives, generic Tricor, generic Lofibra tablets or capsules can pt be switched to either of these ?

## 2022-10-22 ENCOUNTER — Other Ambulatory Visit: Payer: Self-pay | Admitting: Medical

## 2022-10-22 MED ORDER — FENOFIBRATE 54 MG PO TABS: 54.0000 mg | ORAL_TABLET | Freq: Every day | ORAL | 1 refills | Status: DC

## 2022-10-22 NOTE — Telephone Encounter (Signed)
Shane changed to Fenofibrate 54mg  & it went thru ins. Called pt & informed.

## 2022-10-24 NOTE — Telephone Encounter (Signed)
DONE

## 2022-10-27 ENCOUNTER — Telehealth: Payer: Self-pay

## 2022-10-27 NOTE — Telephone Encounter (Signed)
Pt has surgical clearance form for shoulder surgery that is due 10/25 so I scheduled her a visit for tomorrow morning. I saw she had a physical on 10/3, would you still like her to keep the appt or bring the paperwork in?

## 2022-10-28 ENCOUNTER — Institutional Professional Consult (permissible substitution): Payer: BC Managed Care – PPO | Admitting: Medical

## 2022-10-29 ENCOUNTER — Other Ambulatory Visit (HOSPITAL_COMMUNITY): Payer: BC Managed Care – PPO

## 2022-10-29 NOTE — Telephone Encounter (Signed)
I received an FMLA form.  I looked back at her recent visit and I do not recall discussing this.  What is the FMLA for?  What specific dates?

## 2022-10-30 NOTE — Telephone Encounter (Signed)
Pt has appt to discuss alternatives

## 2022-11-11 ENCOUNTER — Ambulatory Visit (HOSPITAL_COMMUNITY)
Admission: RE | Admit: 2022-11-11 | Discharge: 2022-11-11 | Disposition: A | Discharge status: Home / Self Care | Payer: Self-pay | Source: Ambulatory Visit | Attending: Medical | Admitting: Medical

## 2022-11-11 ENCOUNTER — Encounter (HOSPITAL_COMMUNITY): Payer: Self-pay

## 2022-11-11 DIAGNOSIS — I10 Essential (primary) hypertension: Secondary | ICD-10-CM | POA: Insufficient documentation

## 2022-11-11 DIAGNOSIS — Z0000 Encounter for general adult medical examination without abnormal findings: Secondary | ICD-10-CM | POA: Insufficient documentation

## 2022-11-11 DIAGNOSIS — Z136 Encounter for screening for cardiovascular disorders: Secondary | ICD-10-CM | POA: Insufficient documentation

## 2022-11-13 NOTE — Progress Notes (Signed)
Are we going to get the aditional non cardiac read from radiology as well?  Results sent through My Chart

## 2022-11-16 NOTE — Progress Notes (Signed)
 Please call as they have not reviewed my chart results

## 2022-11-17 NOTE — Progress Notes (Signed)
I spoke to imaging dept.  They are going to do the overread which was missing

## 2022-11-17 NOTE — Progress Notes (Signed)
Results sent through MyChart

## 2022-11-21 ENCOUNTER — Other Ambulatory Visit: Payer: Self-pay | Admitting: Medical

## 2022-11-21 DIAGNOSIS — N6320 Unspecified lump in the left breast, unspecified quadrant: Secondary | ICD-10-CM

## 2022-11-27 ENCOUNTER — Ambulatory Visit: Payer: BC Managed Care – PPO

## 2022-12-05 ENCOUNTER — Other Ambulatory Visit: Payer: Self-pay | Admitting: Medical

## 2022-12-05 ENCOUNTER — Ambulatory Visit
Admission: RE | Admit: 2022-12-05 | Discharge: 2022-12-05 | Disposition: A | Discharge status: Home / Self Care | Payer: BC Managed Care – PPO | Source: Ambulatory Visit | Attending: Medical | Admitting: Medical

## 2022-12-05 DIAGNOSIS — N6325 Unspecified lump in the left breast, overlapping quadrants: Secondary | ICD-10-CM | POA: Diagnosis not present

## 2022-12-05 DIAGNOSIS — N6012 Diffuse cystic mastopathy of left breast: Secondary | ICD-10-CM | POA: Diagnosis not present

## 2022-12-05 DIAGNOSIS — N6320 Unspecified lump in the left breast, unspecified quadrant: Secondary | ICD-10-CM

## 2022-12-08 NOTE — Progress Notes (Signed)
Mammogram shows a 8 mm left breast lesion they think is benign.  They recommend 68-month left diagnostic mammogram and ultrasound  Put this on your calendar to call back and schedule

## 2022-12-16 ENCOUNTER — Other Ambulatory Visit: Payer: Self-pay | Admitting: Physician Assistant

## 2022-12-16 MED ORDER — ONDANSETRON HCL 4 MG PO TABS: 4.0000 mg | ORAL_TABLET | Freq: Three times a day (TID) | ORAL | 0 refills | Status: DC | PRN

## 2022-12-16 MED ORDER — OXYCODONE-ACETAMINOPHEN 5-325 MG PO TABS: 1.0000 | ORAL_TABLET | Freq: Four times a day (QID) | ORAL | 0 refills | Status: DC | PRN

## 2022-12-16 MED ORDER — METHOCARBAMOL 750 MG PO TABS: 750.0000 mg | ORAL_TABLET | Freq: Two times a day (BID) | ORAL | 0 refills | Status: DC | PRN

## 2022-12-18 ENCOUNTER — Encounter: Payer: Self-pay | Admitting: Orthopaedic Surgery

## 2022-12-18 DIAGNOSIS — M24111 Other articular cartilage disorders, right shoulder: Secondary | ICD-10-CM | POA: Diagnosis not present

## 2022-12-18 DIAGNOSIS — X58XXXA Exposure to other specified factors, initial encounter: Secondary | ICD-10-CM | POA: Diagnosis not present

## 2022-12-18 DIAGNOSIS — M65911 Unspecified synovitis and tenosynovitis, right shoulder: Secondary | ICD-10-CM | POA: Diagnosis not present

## 2022-12-18 DIAGNOSIS — S46111A Strain of muscle, fascia and tendon of long head of biceps, right arm, initial encounter: Secondary | ICD-10-CM | POA: Diagnosis not present

## 2022-12-18 DIAGNOSIS — G8918 Other acute postprocedural pain: Secondary | ICD-10-CM | POA: Diagnosis not present

## 2022-12-18 DIAGNOSIS — Y999 Unspecified external cause status: Secondary | ICD-10-CM | POA: Diagnosis not present

## 2022-12-18 DIAGNOSIS — M65811 Other synovitis and tenosynovitis, right shoulder: Secondary | ICD-10-CM | POA: Diagnosis not present

## 2022-12-18 DIAGNOSIS — M7521 Bicipital tendinitis, right shoulder: Secondary | ICD-10-CM | POA: Diagnosis not present

## 2022-12-18 DIAGNOSIS — S46011A Strain of muscle(s) and tendon(s) of the rotator cuff of right shoulder, initial encounter: Secondary | ICD-10-CM | POA: Diagnosis not present

## 2022-12-19 DIAGNOSIS — S46011A Strain of muscle(s) and tendon(s) of the rotator cuff of right shoulder, initial encounter: Secondary | ICD-10-CM | POA: Diagnosis not present

## 2022-12-19 DIAGNOSIS — Y999 Unspecified external cause status: Secondary | ICD-10-CM | POA: Diagnosis not present

## 2022-12-19 DIAGNOSIS — X58XXXA Exposure to other specified factors, initial encounter: Secondary | ICD-10-CM | POA: Diagnosis not present

## 2022-12-25 ENCOUNTER — Encounter: Payer: Self-pay | Admitting: Orthopaedic Surgery

## 2022-12-25 ENCOUNTER — Ambulatory Visit: Payer: BC Managed Care – PPO | Admitting: Physician Assistant

## 2022-12-25 DIAGNOSIS — Z9889 Other specified postprocedural states: Secondary | ICD-10-CM

## 2022-12-25 MED ORDER — OXYCODONE-ACETAMINOPHEN 5-325 MG PO TABS: 1.0000 | ORAL_TABLET | Freq: Four times a day (QID) | ORAL | 0 refills | Status: DC | PRN

## 2022-12-25 MED ORDER — METHOCARBAMOL 750 MG PO TABS: 750.0000 mg | ORAL_TABLET | Freq: Two times a day (BID) | ORAL | 0 refills | Status: DC | PRN

## 2022-12-25 NOTE — Progress Notes (Signed)
Post-Op Visit Note   Patient: Ariel Sweeney           Date of Birth: 1965/06/14           MRN: [id number] Visit Date: 12/25/2022 PCP: Jac Canavan, PA-C   Assessment & Plan:  Chief Complaint:  Chief Complaint  Patient presents with   Right Shoulder - Pain   Visit Diagnoses:  1. S/P right rotator cuff repair     Plan: Patient is a pleasant 57 year old female who comes in today 1 week status post right shoulder arthroscopic rotator cuff repair 12/18/2022.  She has been doing well.  She has been taking oxycodone and Robaxin for pain.  She has been compliant wearing her sling.  Examination of her right shoulder reveals fully healed surgical portals with nylon sutures in place.  No evidence of infection or cellulitis.  Fingers are warm well-perfused.  She is neurovascularly intact distally.  Today, sutures were removed and Steri-Strips applied.  Intraoperative pictures reviewed.  She will continue wearing her sling until she is 6 weeks postop.  Outpatient physical therapy referral has been made.  She will follow-up with [place] in 5 weeks for recheck.  Call with concerns or questions.  Follow-Up Instructions: Return in about 5 weeks (around 01/29/2023).   Orders:  No orders of the defined types were placed in this encounter.  Meds ordered this encounter  Medications   oxyCODONE-acetaminophen (PERCOCET) 5-325 MG tablet    Sig: Take 1 tablet by mouth every 6 (six) hours as needed. To be taken after surgery    Dispense:  30 tablet    Refill:  0   methocarbamol (ROBAXIN-750) 750 MG tablet    Sig: Take 1 tablet (750 mg total) by mouth 2 (two) times daily as needed for muscle spasms.    Dispense:  20 tablet    Refill:  0    Imaging: No new imaging  PMFS History: Patient Active Problem List   Diagnosis Date Noted   Diabetes mellitus type 2 with complications (HCC) 02/05/2022   Screening for lung cancer 06/13/2021   Need for pneumococcal vaccination 05/30/2020   Colon cancer  screening 05/30/2020   Hyperlipidemia 09/21/2019   Nasal drainage 09/08/2019   Snoring 09/08/2019   Fatigue 09/08/2019   Sleep disturbance 09/08/2019   Ill-fitting dentures 09/08/2019   Vaccine counseling 09/22/2017   Influenza vaccination declined 09/22/2017   Right arm pain 02/26/2017   Vitamin D deficiency 07/09/2016   Screening for cervical cancer 07/09/2016   Pain in both thighs 06/23/2016   Decreased pedal pulses 06/23/2016   Encounter for health maintenance examination in adult 06/06/2015   Tobacco use disorder 06/06/2015   Screening for breast cancer 06/06/2015   Routine general medical examination at a health care facility 06/06/2015   Right shoulder pain 06/06/2015   Essential hypertension 04/17/2014   Goiter 04/17/2014   Obesity 04/17/2014   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   Past Medical History:  Diagnosis Date   Decreased pulse    normal ABIs 06/2016   Full dentures 08/2018   GERD (gastroesophageal reflux disease)    intermittent   Goiter    Hyperlipidemia    Hypertension    Obesity    Tobacco use    Vitamin D deficiency    Wears contact lenses     Family History  Problem Relation Age of Onset   Diabetes Mother    Hypertension Mother    Kidney disease Mother    Anemia Mother  Hypertension Father    Diabetes Father    Heart disease Father    Hypertension Sister    Stroke Maternal Aunt    Cancer Neg Hx    Colon cancer Neg Hx     Past Surgical History:  Procedure Laterality Date   BREAST CYST ASPIRATION Right 2017   cyst    CHOLECYSTECTOMY     COLONOSCOPY  09/2015   tubular adenoma, repeat 5 years;  Dr. Marsa Aris   COLPOSCOPY     history of abnormal pap in remote past   STRABISMUS SURGERY     childhood   Social History   Occupational History   Not on file  Tobacco Use   Smoking status: Some Days    Current packs/day: 0.25    Average packs/day: 0.3 packs/day for 27.0 years (6.8 ttl pk-yrs)    Types: Cigarettes   Smokeless tobacco:  Never  Vaping Use   Vaping status: Never Used  Substance and Sexual Activity   Alcohol use: Yes    Alcohol/week: 3.0 standard drinks of alcohol    Types: 1 Glasses of wine, 1 Cans of beer, 1 Shots of liquor per week    Comment: 4 weekly   Drug use: No   Sexual activity: Not on file

## 2022-12-29 ENCOUNTER — Ambulatory Visit (INDEPENDENT_AMBULATORY_CARE_PROVIDER_SITE_OTHER): Payer: BC Managed Care – PPO | Admitting: Physical Therapy

## 2022-12-29 ENCOUNTER — Encounter: Payer: Self-pay | Admitting: Physical Therapy

## 2022-12-29 DIAGNOSIS — R600 Localized edema: Secondary | ICD-10-CM

## 2022-12-29 DIAGNOSIS — M6281 Muscle weakness (generalized): Secondary | ICD-10-CM

## 2022-12-29 DIAGNOSIS — M25511 Pain in right shoulder: Secondary | ICD-10-CM

## 2022-12-29 NOTE — Therapy (Signed)
OUTPATIENT PHYSICAL THERAPY SHOULDER EVALUATION   Patient Name: Ariel Sweeney MRN: [id number] DOB:Jan 06, 1966, 57 y.o., female Today's Date: 12/29/2022  END OF SESSION:  PT End of Session - 12/29/22 0813     Visit Number 1    Number of Visits 20    Date for PT Re-Evaluation 03/09/23    PT Start Time 0805    PT Stop Time 0845    PT Time Calculation (min) 40 min    Activity Tolerance Patient tolerated treatment well    Behavior During Therapy [facility] for tasks assessed/performed             Past Medical History:  Diagnosis Date   Decreased pulse    normal ABIs 06/2016   Full dentures 08/2018   GERD (gastroesophageal reflux disease)    intermittent   Goiter    Hyperlipidemia    Hypertension    Obesity    Tobacco use    Vitamin D deficiency    Wears contact lenses    Past Surgical History:  Procedure Laterality Date   BREAST CYST ASPIRATION Right 2017   cyst    CHOLECYSTECTOMY     COLONOSCOPY  09/2015   tubular adenoma, repeat 5 years;  Dr. Marsa Aris   COLPOSCOPY     history of abnormal pap in remote past   STRABISMUS SURGERY     childhood   Patient Active Problem List   Diagnosis Date Noted   Diabetes mellitus type 2 with complications (HCC) 02/05/2022   Screening for lung cancer 06/13/2021   Need for pneumococcal vaccination 05/30/2020   Colon cancer screening 05/30/2020   Hyperlipidemia 09/21/2019   Nasal drainage 09/08/2019   Snoring 09/08/2019   Fatigue 09/08/2019   Sleep disturbance 09/08/2019   Ill-fitting dentures 09/08/2019   Vaccine counseling 09/22/2017   Influenza vaccination declined 09/22/2017   Right arm pain 02/26/2017   Vitamin D deficiency 07/09/2016   Screening for cervical cancer 07/09/2016   Pain in both thighs 06/23/2016   Decreased pedal pulses 06/23/2016   Encounter for health maintenance examination in adult 06/06/2015   Tobacco use disorder 06/06/2015   Screening for breast cancer 06/06/2015   Routine general medical  examination at a health care facility 06/06/2015   Right shoulder pain 06/06/2015   Essential hypertension 04/17/2014   Goiter 04/17/2014   Obesity 04/17/2014   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    PCP: Jac Canavan, PA-C   REFERRING PROVIDER: Cristie Hem, PA-C   REFERRING DIAG:  Diagnosis  [phone] (ICD-10-CM) - S/P right rotator cuff repair    THERAPY DIAG:  Acute pain of right shoulder  Muscle weakness (generalized)  Localized edema  Rationale for Evaluation and Treatment: Rehabilitation  ONSET DATE:  Diagnosis  Z98.890 (ICD-10-CM) - S/P right rotator cuff repair  SUBJECTIVE STATEMENT: Pt s/p right shoulder arthroscopic rotator cuff repair 12/18/2022   PERTINENT HISTORY: HTN, obesity, vit D deficiency, hyperlipidemia, breast cyst aspiration, strabismus surgery  MRI: Impression: 09/28/22 1. Full-thickness tear of the anterior 13 mm of the supraspinatus tendon footprint. Mild anterior supraspinatus muscle atrophy. 2. Mild tendinosis of the distal critical zone and footprint of the infraspinatus. 3. Very mild superior subscapularis tendinosis. 4. Mild to moderate degenerative changes of the acromioclavicular joint.  Wear sling until 6 weeks post op.   PAIN:  NPRS scale: 2/10 at rest, worse 5/10 Pain location: Rt shoulder Pain description: achy, throbbing Aggravating factors: movement, sleeping, certain positions Relieving factors: meds, changing positions  PRECAUTIONS: Shoulder  WEIGHT BEARING RESTRICTIONS: No  FALLS:  Has patient fallen in last 6 months? No  LIVING ENVIRONMENT: Lives with: lives with their family and lives with their spouse Lives in: House/apartment Stairs: Yes: External: 3 steps; none Has following equipment at home: None  OCCUPATION: Urgent care  reception  PLOF: Independent  PATIENT GOALS:stop hurting, be able to cook and complete house hold chores  Next MD visit:   OBJECTIVE:   DIAGNOSTIC FINDINGS:  1. Full-thickness tear of the anterior 13 mm of the supraspinatus tendon footprint. Mild anterior supraspinatus muscle atrophy. 2. Mild tendinosis of the distal critical zone and footprint of the infraspinatus. 3. Very mild superior subscapularis tendinosis. 4. Mild to moderate degenerative changes of the acromioclavicular joint.  PATIENT SURVEYS:  12/29/22: FOTO intake:  28%   COGNITION: Overall cognitive status: WFL     SENSATION: WFL  POSTURE: Rounded shoulders and forward head  UPPER EXTREMITY ROM:   ROM Right 12/29/22 Left 12/29/22  Shoulder flexion 100 158  Shoulder extension    Shoulder abduction    Shoulder adduction    Shoulder internal rotation 60 76  Shoulder external rotation 30 70  Elbow flexion 145 146  Elbow extension 0 0  Wrist flexion    Wrist extension    Wrist ulnar deviation    Wrist radial deviation    Wrist pronation    Wrist supination    (Blank rows = not tested)  UPPER EXTREMITY MMT:  MMT Right eval Left eval  Shoulder flexion  4  Shoulder extension  4  Shoulder abduction  4  Shoulder adduction    Shoulder internal rotation  4  Shoulder external rotation  4  Middle trapezius    Lower trapezius    Elbow flexion    Elbow extension    Wrist flexion    Wrist extension    Wrist ulnar deviation    Wrist radial deviation    Wrist pronation    Wrist supination    Grip strength (lbs)    (Blank rows = not tested)  TODAY'S TREATMENT:                                                                                                       DATE: 12/29/22:  Therex:    HEP  instruction/performance c cues for techniques, handout provided.  Trial set performed of each for comprehension and symptom assessment.  See below for exercise list   PATIENT EDUCATION: Education details: HEP, POC Person educated: Patient Education method: Explanation, Demonstration, Verbal cues, and Handouts Education comprehension: verbalized understanding, returned demonstration, and verbal cues required  HOME EXERCISE PROGRAM: Exercises - Circular Shoulder Pendulum with Table Support  - 3 x daily - 7 x weekly - 30 reps - Horizontal Shoulder Pendulum with Table Support  - 3 x daily - 7 x weekly - 30 reps - Flexion-Extension Shoulder Pendulum with Table Support  - 3 x daily - 7 x weekly - 30 reps - Seated Bilateral Shoulder Flexion Towel Slide at Table Top  - 3 x daily - 7 x weekly - 2 sets - 10 reps  ASSESSMENT:  CLINICAL IMPRESSION: Patient is a 57 y.o. who comes to clinic with complaints of Rt shoulder pain s/p right shoulder arthroscopic rotator cuff repair on 12/18/2022. Pt presents  with mobility, strength and movement coordination deficits that impair their ability to perform usual daily and recreational functional activities without increase difficulty/symptoms at this time.  Patient to benefit from skilled PT services to address impairments and limitations to improve to previous level of function without restriction secondary to condition.   OBJECTIVE IMPAIRMENTS: decreased mobility, decreased ROM, decreased strength, increased edema, impaired UE functional use, and pain.   ACTIVITY LIMITATIONS: lifting, sleeping, bed mobility, bathing, toileting, dressing, self feeding, reach over head, and hygiene/grooming  PARTICIPATION LIMITATIONS: meal prep, cleaning, laundry, driving, community activity, and occupation  PERSONAL FACTORS: 3+ comorbidities: see pertinent history above  are also affecting patient's functional outcome.   REHAB POTENTIAL: Good  CLINICAL DECISION MAKING:  Stable/uncomplicated  EVALUATION COMPLEXITY: Low   GOALS: Goals reviewed with patient? Yes  SHORT TERM GOALS: (target date for Short term goals are 3 weeks 01/19/2023)  1.Patient will demonstrate independent use of home exercise program to maintain progress from in clinic treatments. Goal status: New  LONG TERM GOALS: (target dates for all long term goals are 10 weeks  03/09/2023 )   1. Patient will demonstrate/report pain at worst less than or equal to 2/10 to facilitate minimal limitation in daily activity secondary to pain symptoms. Goal status: New   2. Patient will demonstrate independent use of home exercise program to facilitate ability to maintain/progress functional gains from skilled physical therapy services. Goal status: New   3. Patient will demonstrate FOTO outcome > or = 60 % to indicate reduced disability due to condition. Goal status: New   4.  Patient will demonstrate Rt UE MMT >/= 4/5 throughout to facilitate lifting, reaching, carrying at [facility] in daily activity.   Goal status: New   5.  Patient will demonstrate Rt shoulder flexion to >/= 140 degrees to facilitate ADL's and functional mobility.  Goal status: New   6.  Pt will improve her Rt shoulder ER to >/= 60 degrees to improve her ADL's and functional mobility.  Goal status: New   7.  pt will be able to lift 5 # from counter to over head shelf with her Rt UE with pain </= 2/10.  Goal Status: New  PLAN:  PT FREQUENCY: 1-2x/week  PT DURATION: 10 weeks  PLANNED INTERVENTIONS: Can include 87564- PT Re-evaluation, 97110-Therapeutic exercises, 97530- Therapeutic activity, [id number]- Neuromuscular re-education, 97535- Self Care, 97140- Manual therapy, [phone]- Gait training, [phone]- Orthotic Fit/training, [id number]- Canalith repositioning, [id number]- Aquatic Therapy, 97014- Electrical stimulation (unattended), [id number]- Electrical stimulation (manual), [id number]- Vasopneumatic device, [id number]- Ultrasound, [id number]- Traction  (mechanical), [id number]- Ionotophoresis 4mg /ml Dexamethasone, Patient/Family education, Balance training, Stair training, Taping, Dry Needling, Joint mobilization, Joint manipulation, Spinal manipulation, Spinal mobilization, Scar mobilization, Vestibular training, Visual/preceptual remediation/compensation, DME instructions, Cryotherapy, and Moist heat.  All performed as medically necessary.  All included unless contraindicated  PLAN FOR NEXT SESSION: Review HEP knowledge/results, PROM Vaso-pneumatic      Sharmon Leyden, PT, MPT 12/29/2022, 1:17 PM

## 2023-01-01 ENCOUNTER — Ambulatory Visit: Payer: BC Managed Care – PPO | Admitting: Rehabilitative and Restorative Service Providers"

## 2023-01-01 ENCOUNTER — Encounter: Payer: Self-pay | Admitting: Rehabilitative and Restorative Service Providers"

## 2023-01-01 DIAGNOSIS — R600 Localized edema: Secondary | ICD-10-CM

## 2023-01-01 DIAGNOSIS — M25511 Pain in right shoulder: Secondary | ICD-10-CM

## 2023-01-01 DIAGNOSIS — M6281 Muscle weakness (generalized): Secondary | ICD-10-CM

## 2023-01-01 NOTE — Therapy (Signed)
OUTPATIENT PHYSICAL THERAPY TREATMENT   Patient Name: VERGA DABNEY MRN: [id number] DOB:1965/11/05, 57 y.o., female Today's Date: 01/01/2023  END OF SESSION:  PT End of Session - 01/01/23 1343     Visit Number 2    Number of Visits 20    Date for PT Re-Evaluation 03/09/23    PT Start Time 1340    PT Stop Time 1421    PT Time Calculation (min) 41 min    Activity Tolerance Patient tolerated treatment well    Behavior During Therapy [facility] for tasks assessed/performed              Past Medical History:  Diagnosis Date   Decreased pulse    normal ABIs 06/2016   Full dentures 08/2018   GERD (gastroesophageal reflux disease)    intermittent   Goiter    Hyperlipidemia    Hypertension    Obesity    Tobacco use    Vitamin D deficiency    Wears contact lenses    Past Surgical History:  Procedure Laterality Date   BREAST CYST ASPIRATION Right 2017   cyst    CHOLECYSTECTOMY     COLONOSCOPY  09/2015   tubular adenoma, repeat 5 years;  Dr. Marsa Aris   COLPOSCOPY     history of abnormal pap in remote past   STRABISMUS SURGERY     childhood   Patient Active Problem List   Diagnosis Date Noted   Diabetes mellitus type 2 with complications (HCC) 02/05/2022   Screening for lung cancer 06/13/2021   Need for pneumococcal vaccination 05/30/2020   Colon cancer screening 05/30/2020   Hyperlipidemia 09/21/2019   Nasal drainage 09/08/2019   Snoring 09/08/2019   Fatigue 09/08/2019   Sleep disturbance 09/08/2019   Ill-fitting dentures 09/08/2019   Vaccine counseling 09/22/2017   Influenza vaccination declined 09/22/2017   Right arm pain 02/26/2017   Vitamin D deficiency 07/09/2016   Screening for cervical cancer 07/09/2016   Pain in both thighs 06/23/2016   Decreased pedal pulses 06/23/2016   Encounter for health maintenance examination in adult 06/06/2015   Tobacco use disorder 06/06/2015   Screening for breast cancer 06/06/2015   Routine general medical  examination at a health care facility 06/06/2015   Right shoulder pain 06/06/2015   Essential hypertension 04/17/2014   Goiter 04/17/2014   Obesity 04/17/2014   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  Pt indicated no follow up questions about HEP.  Sleeping bed/  PERTINENT HISTORY: HTN, obesity, vit D deficiency, hyperlipidemia, breast cyst aspiration, strabismus surgery  MRI: Impression: 09/28/22 1. Full-thickness tear of the anterior 13 mm of the supraspinatus tendon footprint. Mild anterior supraspinatus muscle atrophy. 2. Mild tendinosis of the distal critical zone and footprint of the infraspinatus. 3. Very mild superior subscapularis tendinosis. 4. Mild to moderate degenerative changes of the acromioclavicular joint.  Wear sling until 6 weeks post op.   PAIN:  NPRS scale: at worst in last 24 hours:  7/10. Current:  2/10 Pain location: Rt shoulder Pain description: achy, throbbing Aggravating factors: movement, sleeping, certain positions Relieving factors: meds, changing positions  PRECAUTIONS: Shoulder  WEIGHT BEARING RESTRICTIONS: No  FALLS:  Has patient fallen in last 6 months? No  LIVING ENVIRONMENT: Lives with: lives with their family and lives with their spouse Lives in: House/apartment Stairs: Yes: External: 3 steps; none Has following equipment  at home: None  OCCUPATION: Urgent care reception  PLOF: Independent  PATIENT GOALS:stop hurting, be able to cook and complete house hold chores  Next MD visit:   OBJECTIVE:   DIAGNOSTIC FINDINGS:  12/29/2022 review of chart:   1. Full-thickness tear of the anterior 13 mm of the supraspinatus tendon footprint. Mild anterior supraspinatus muscle atrophy. 2. Mild tendinosis of the distal critical zone and footprint of the infraspinatus. 3. Very mild superior subscapularis tendinosis. 4. Mild to moderate degenerative changes of the acromioclavicular joint.  PATIENT SURVEYS:  12/29/22: FOTO intake:  28%   COGNITION: 12/29/2022 Overall cognitive status: WFL     SENSATION: 12/29/2022 WFL  POSTURE: 12/29/2022 Rounded shoulders and forward head  UPPER EXTREMITY ROM:   ROM Right 12/29/22 Left 12/29/22  Shoulder flexion 100 158  Shoulder extension    Shoulder abduction    Shoulder adduction    Shoulder internal rotation 60 76  Shoulder external rotation 30 70  Elbow flexion 145 146  Elbow extension 0 0  Wrist flexion    Wrist extension    Wrist ulnar deviation    Wrist radial deviation    Wrist pronation    Wrist supination    (Blank rows = not tested)  UPPER EXTREMITY MMT:  MMT Right 12/29/2022 Left 12/29/2022  Shoulder flexion  4  Shoulder extension  4  Shoulder abduction  4  Shoulder adduction    Shoulder internal rotation  4  Shoulder external rotation  4  Middle trapezius    Lower trapezius    Elbow flexion    Elbow extension    Wrist flexion    Wrist extension    Wrist ulnar deviation    Wrist radial deviation    Wrist pronation    Wrist supination    Grip strength (lbs)    (Blank rows = not tested)  TODAY'S TREATMENT:                                                                                  DATE: 01/01/2023:  Manual Supine Rt shoulder passive range in various directions of elevation.  G2/g3 inferior joint mobs RT GH joint, Posterior glide   Therex: Standing pendulum review in all directions 1 min x 2  Seated Rt shoulder flexion on table passive range x 10 2-3 sec hold Seated Rt shoulder scaption on table passive range x 10 2-3 sec hold Verbal review of existing HEP.   Estim IFC to Rt shoulder to tolerance 10 mins for pain relief   TODAY'S TREATMENT:                                                                                 DATE:12/29/22:  Therex:    HEP instruction/performance c cues for techniques, handout provided.  Trial set performed of each for comprehension and symptom assessment.  See below for exercise list   PATIENT EDUCATION: 12/29/2022 Education details: HEP, POC Person educated: Patient Education method: Programmer, multimedia, Demonstration, Verbal cues, and Handouts Education comprehension: verbalized understanding, returned demonstration, and verbal cues required  HOME EXERCISE PROGRAM: Exercises - Circular Shoulder Pendulum with Table Support  - 3 x daily - 7 x weekly - 30 reps - Horizontal Shoulder Pendulum with Table Support  - 3 x daily - 7 x weekly - 30 reps - Flexion-Extension Shoulder Pendulum with Table Support  - 3 x daily - 7 x weekly - 30 reps - Seated Bilateral Shoulder Flexion Towel Slide at Table Top  - 3 x daily - 7 x weekly - 2 sets - 10 reps  ASSESSMENT:  CLINICAL IMPRESSION: Presentation of elevation passively to approx. 110 deg in flexion, scaption.  Pain in end range chief limitation in mobility today in clinic with muscle guarding noted but not capsular tightness.  Continued skilled PT services indicated at this time.     OBJECTIVE IMPAIRMENTS: decreased mobility, decreased ROM, decreased strength, increased edema, impaired UE functional use, and pain.   ACTIVITY LIMITATIONS:  lifting, sleeping, bed mobility, bathing, toileting, dressing, self feeding, reach over head, and hygiene/grooming  PARTICIPATION LIMITATIONS: meal prep, cleaning, laundry, driving, community activity, and occupation  PERSONAL FACTORS: 3+ comorbidities: see pertinent history above  are also affecting patient's functional outcome.   REHAB POTENTIAL: Good  CLINICAL DECISION MAKING: Stable/uncomplicated  EVALUATION COMPLEXITY: Low   GOALS: Goals reviewed with patient? Yes  SHORT TERM GOALS: (target date for Short term goals are 3 weeks 01/19/2023)  1.Patient will demonstrate independent use of home exercise program to maintain progress from in clinic treatments. Goal status: on going 01/01/2023  LONG TERM GOALS: (target dates for all long term goals are 10 weeks  03/09/2023 )   1. Patient will demonstrate/report pain at worst less than or equal to 2/10  to facilitate minimal limitation in daily activity secondary to pain symptoms. Goal status: New   2. Patient will demonstrate independent use of home exercise program to facilitate ability to maintain/progress functional gains from skilled physical therapy services. Goal status: New   3. Patient will demonstrate FOTO outcome > or = 60 % to indicate reduced disability due to condition. Goal status: New   4.  Patient will demonstrate Rt UE MMT >/= 4/5 throughout to facilitate lifting, reaching, carrying at [facility] in daily activity.   Goal status: New   5.  Patient will demonstrate Rt shoulder flexion to >/= 140 degrees to facilitate ADL's and functional mobility.    Goal status: New   6.  Pt will improve her Rt shoulder ER to >/= 60 degrees to improve her ADL's and functional mobility.  Goal status: New   7.  pt will be able to lift 5 # from counter to over head shelf with her Rt UE with pain </= 2/10.  Goal Status: New  PLAN:  PT FREQUENCY: 1-2x/week  PT DURATION: 10 weeks  PLANNED INTERVENTIONS: Can include 16109- PT  Re-evaluation, 97110-Therapeutic exercises, 97530- Therapeutic activity, [id number]- Neuromuscular re-education, 97535- Self Care, 97140- Manual therapy, [phone]- Gait training, [phone]- Orthotic Fit/training, [phone]- Canalith repositioning, [id number]- Aquatic Therapy, 97014- Electrical stimulation (unattended), [id number]- Electrical stimulation (manual), [id number]- Vasopneumatic device, [id number]- Ultrasound, [id number]- Traction (mechanical), [id number]- Ionotophoresis 4mg /ml Dexamethasone, Patient/Family education, Balance training, Stair training, Taping, Dry Needling, Joint mobilization, Joint manipulation, Spinal manipulation, Spinal mobilization, Scar mobilization, Vestibular training, Visual/preceptual remediation/compensation, DME instructions, Cryotherapy, and Moist heat.  All performed as medically necessary.  All included unless contraindicated  PLAN FOR NEXT SESSION: PROM, manual mobilizations.   PROM for 5-6 weeks post op to match sling use.    Chyrel Masson, PT, DPT, OCS, ATC 01/01/23  2:17 PM

## 2023-01-02 ENCOUNTER — Encounter: Payer: BC Managed Care – PPO | Admitting: Physical Therapy

## 2023-01-05 ENCOUNTER — Telehealth: Payer: Self-pay | Admitting: *Deleted

## 2023-01-05 ENCOUNTER — Ambulatory Visit (AMBULATORY_SURGERY_CENTER): Payer: BC Managed Care – PPO | Admitting: *Deleted

## 2023-01-05 VITALS — Ht 62.0 in | Wt 185.0 lb

## 2023-01-05 DIAGNOSIS — Z860100 Personal history of colon polyps, unspecified: Secondary | ICD-10-CM

## 2023-01-05 MED ORDER — NA SULFATE-K SULFATE-MG SULF 17.5-3.13-1.6 GM/177ML PO SOLN: 1.0000 | Freq: Once | ORAL | 0 refills | Status: AC

## 2023-01-05 NOTE — Telephone Encounter (Signed)
 Attempt to reach pt for pre-visit. LM with call back #.  Will attempt to reach again in 5 min due to no other # listed in profile  2nd attempt reached pt

## 2023-01-05 NOTE — Progress Notes (Signed)
Pt's name and DOB verified at the beginning of the pre-visit wit 2 identifiers  Pt denies any difficulty with ambulating,sitting, laying down or rolling side to side  Pt has no issues with ambulation   Pt has no issues moving head neck or swallowing  No egg or soy allergy known to patient   No issues known to pt with past sedation with any surgeries or procedures  Pt denies having issues being intubated  No FH of Malignant Hyperthermia  Pt is not on diet pills or shots  Pt is not on home 02   Pt is not on blood thinners   Pt denies issues with constipation   Pt is not on dialysis  Pt denise any abnormal heart rhythms   Pt denies any upcoming cardiac testing  Pt encouraged to use to use Singlecare or Goodrx to reduce cost   Patient's chart reviewed by Cathlyn Parsons CNRA prior to pre-visit and patient appropriate for the [facility].  Pre-visit completed and red dot placed by patient's name on their procedure day (on provider's schedule).  .  Visit by phone  Pt states weight is 185 lb   Instructed pt why it is important to and  to call if they have any changes in health or new medications. Directed them to the # given and on instructions.     Instructions reviewed. Pt given both [facility] main # and MD on call # prior to instructions.  Pt states understanding. Instructed to review again prior to procedure. Pt states they will.   Instructions sent by mail with coupon and by My Chart  Instructions and coupon given to pt   Instructions sent through My Chart  Coupon sent via text to mobile phone and pt verified they received it

## 2023-01-08 ENCOUNTER — Encounter: Payer: Self-pay | Admitting: Rehabilitative and Restorative Service Providers"

## 2023-01-08 ENCOUNTER — Ambulatory Visit (INDEPENDENT_AMBULATORY_CARE_PROVIDER_SITE_OTHER): Payer: BC Managed Care – PPO | Admitting: Rehabilitative and Restorative Service Providers"

## 2023-01-08 ENCOUNTER — Encounter: Payer: BC Managed Care – PPO | Admitting: Rehabilitative and Restorative Service Providers"

## 2023-01-08 DIAGNOSIS — M25511 Pain in right shoulder: Secondary | ICD-10-CM | POA: Diagnosis not present

## 2023-01-08 DIAGNOSIS — M6281 Muscle weakness (generalized): Secondary | ICD-10-CM | POA: Diagnosis not present

## 2023-01-08 DIAGNOSIS — R600 Localized edema: Secondary | ICD-10-CM

## 2023-01-08 NOTE — Therapy (Signed)
OUTPATIENT PHYSICAL THERAPY TREATMENT   Patient Name: Ariel Sweeney MRN: [id number] DOB:Jul 22, 1965, 57 y.o., female Today's Date: 01/08/2023  END OF SESSION:  PT End of Session - 01/08/23 0803     Visit Number 3    Number of Visits 20    Date for PT Re-Evaluation 03/09/23    Authorization Type [employer] deductible met for 2024 - 30 visits    PT Start Time 0759    PT Stop Time 0837    PT Time Calculation (min) 38 min    Activity Tolerance Patient tolerated treatment well    Behavior During Therapy [facility] for tasks assessed/performed               Past Medical History:  Diagnosis Date   Decreased pulse    normal ABIs 06/2016   Diabetes mellitus without complication (HCC)    Full dentures 08/2018   GERD (gastroesophageal reflux disease)    intermittent   Goiter    Hyperlipidemia    Hypertension    Obesity    Tobacco use    Vitamin D deficiency    Wears contact lenses    Past Surgical History:  Procedure Laterality Date   BREAST CYST ASPIRATION Right 2017   cyst    CHOLECYSTECTOMY     COLONOSCOPY  09/2015   tubular adenoma, repeat 5 years;  Dr. Marsa Aris   COLPOSCOPY     history of abnormal pap in remote past   ROTATOR CUFF REPAIR Right    12/24   STRABISMUS SURGERY     childhood   Patient Active Problem List   Diagnosis Date Noted   Diabetes mellitus type 2 with complications (HCC) 02/05/2022   Screening for lung cancer 06/13/2021   Need for pneumococcal vaccination 05/30/2020   Colon cancer screening 05/30/2020   Hyperlipidemia 09/21/2019   Nasal drainage 09/08/2019   Snoring 09/08/2019   Fatigue 09/08/2019   Sleep disturbance 09/08/2019   Ill-fitting dentures 09/08/2019   Vaccine counseling 09/22/2017   Influenza vaccination declined 09/22/2017   Right arm pain 02/26/2017   Vitamin D deficiency 07/09/2016   Screening for cervical cancer 07/09/2016   Pain in both thighs 06/23/2016   Decreased pedal pulses 06/23/2016   Encounter for health  maintenance examination in adult 06/06/2015   Tobacco use disorder 06/06/2015   Screening for breast cancer 06/06/2015   Routine general medical examination at a health care facility 06/06/2015   Right shoulder pain 06/06/2015   Essential hypertension 04/17/2014   Goiter 04/17/2014   Obesity 04/17/2014   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   PERTINENT HISTORY: HTN, obesity, vit D deficiency, hyperlipidemia, breast cyst aspiration, strabismus surgery  MRI: Impression: 09/28/22 1. Full-thickness tear of the anterior 13 mm of the supraspinatus tendon footprint. Mild anterior supraspinatus muscle atrophy. 2. Mild tendinosis of the distal critical zone and footprint of the infraspinatus. 3. Very mild superior subscapularis tendinosis. 4. Mild to moderate degenerative changes of the acromioclavicular joint.  Wear sling until 6 weeks post op.   PAIN:  NPRS scale: at worst in last few days:  5/10.  Pain location: Rt shoulder Pain description: achy, throbbing Aggravating factors: movement, sleeping, certain positions Relieving factors: meds, changing positions  PRECAUTIONS: Shoulder  WEIGHT BEARING RESTRICTIONS: No  FALLS:  Has patient fallen in last 6 months? No  LIVING ENVIRONMENT: Lives with: lives with their family and lives with their  spouse Lives in: House/apartment Stairs: Yes: External: 3 steps; none Has following equipment at home: None  OCCUPATION: Urgent care reception  PLOF: Independent  PATIENT GOALS:stop hurting, be able to cook and complete house hold chores  Next MD visit:   OBJECTIVE:   DIAGNOSTIC FINDINGS:  12/29/2022 review of chart:   1. Full-thickness tear of the anterior 13 mm of the supraspinatus tendon footprint. Mild anterior supraspinatus muscle atrophy. 2. Mild tendinosis of the distal critical zone and footprint of the infraspinatus. 3. Very mild superior subscapularis tendinosis. 4. Mild to moderate degenerative changes of the acromioclavicular joint.  PATIENT SURVEYS:  12/29/22: FOTO intake:  28%   COGNITION: 12/29/2022 Overall cognitive status: WFL     SENSATION: 12/29/2022 WFL  POSTURE: 12/29/2022 Rounded shoulders and forward head  UPPER EXTREMITY ROM:   ROM Right 12/29/22 Left 12/29/22 Right 01/08/2023 PROM in supine - limited by pain  Shoulder flexion 100 158 128  Shoulder extension     Shoulder abduction   100  Shoulder adduction     Shoulder internal rotation 60 76 75  Shoulder external rotation 30 70 60  Elbow flexion 145 146   Elbow extension 0 0   Wrist flexion     Wrist extension     Wrist ulnar deviation     Wrist radial deviation     Wrist pronation     Wrist supination     (Blank rows = not tested)  UPPER EXTREMITY MMT:  MMT Right 12/29/2022 Left 12/29/2022  Shoulder flexion  4  Shoulder extension  4  Shoulder abduction  4  Shoulder adduction    Shoulder internal rotation  4  Shoulder external rotation  4  Middle trapezius    Lower trapezius    Elbow flexion    Elbow extension    Wrist flexion    Wrist extension    Wrist ulnar deviation    Wrist radial deviation    Wrist pronation    Wrist supination    Grip strength (lbs)    (Blank rows = not tested)  TODAY'S TREATMENT:                                                                                 DATE: 01/08/2023:  Manual Supine Rt shoulder passive range in various directions of elevation.  G2/g3 inferior joint mobs RT GH joint, Posterior glide   Therex: Seated 2 lb ball in lap grip squeeze 5 sec hold on /off x 10 Rt arm Seated pulleys PROM focus with arm close to body 3 min in flexion, 3 min in scaption.  Table slides flexion 5 sec hold x 10, scaption 5 sec hold x 10    TODAY'S TREATMENT:                                                                                 DATE: 01/01/2023:  Manual Supine Rt shoulder passive range in various directions of elevation.  G2/g3 inferior joint mobs RT GH joint, Posterior glide   Therex: Standing pendulum review in all directions 1 min x 2  Seated Rt shoulder flexion on table passive range x 10 2-3 sec hold Seated Rt shoulder scaption on table passive range x 10 2-3 sec hold Verbal review of existing HEP.   Estim IFC to Rt shoulder to tolerance 10 mins for pain relief   TODAY'S TREATMENT:                                                                                 DATE:12/29/22:  Therex:    HEP instruction/performance c cues for techniques, handout provided.  Trial set performed of each for comprehension and symptom assessment.  See below for exercise list   PATIENT EDUCATION: 12/29/2022 Education details: HEP, POC Person educated: Patient Education method: Programmer, multimedia, Demonstration, Verbal cues, and Handouts Education comprehension: verbalized understanding, returned demonstration, and verbal cues required  HOME EXERCISE PROGRAM: Exercises - Circular Shoulder Pendulum with Table Support  - 3 x daily - 7 x weekly - 30 reps - Horizontal Shoulder Pendulum with Table Support  - 3 x daily - 7 x weekly - 30  reps - Flexion-Extension Shoulder Pendulum with Table Support  - 3 x daily - 7 x weekly - 30 reps - Seated Bilateral Shoulder Flexion Towel Slide at Table Top  - 3 x daily - 7 x weekly - 2 sets - 10 reps  ASSESSMENT:  CLINICAL IMPRESSION: Continued improvement in tolerance to passive range at this time with improvement noted in measurements as taken today in objective data.  Making good progression in this time of passive range focus.   OBJECTIVE IMPAIRMENTS:  decreased mobility, decreased ROM, decreased strength, increased edema, impaired UE functional use, and pain.   ACTIVITY LIMITATIONS: lifting, sleeping, bed mobility, bathing, toileting, dressing, self feeding, reach over head, and hygiene/grooming  PARTICIPATION LIMITATIONS: meal prep, cleaning, laundry, driving, community activity, and occupation  PERSONAL FACTORS: 3+ comorbidities: see pertinent history above  are also affecting patient's functional outcome.   REHAB POTENTIAL: Good  CLINICAL DECISION MAKING: Stable/uncomplicated  EVALUATION COMPLEXITY: Low   GOALS: Goals reviewed with patient? Yes  SHORT TERM GOALS: (target date for Short term goals are 3 weeks 01/19/2023)  1.Patient will demonstrate independent use of home exercise program to maintain progress from in clinic treatments. Goal status: on going 01/01/2023  LONG TERM GOALS: (target dates for all long term goals are 10 weeks  03/09/2023 )   1. Patient will demonstrate/report pain at worst less than or equal to 2/10 to facilitate minimal limitation in daily activity secondary to pain symptoms. Goal status: New   2. Patient will demonstrate independent use of home exercise program to facilitate ability to maintain/progress functional gains from skilled physical therapy services. Goal status: New   3. Patient will demonstrate FOTO outcome > or = 60 % to indicate reduced disability due to condition. Goal status: New   4.  Patient will demonstrate Rt UE MMT >/=  4/5 throughout to facilitate lifting, reaching, carrying at [facility] in daily activity.   Goal status: New   5.  Patient will demonstrate Rt shoulder flexion to >/= 140 degrees to facilitate ADL's and functional mobility.    Goal status: New   6.  Pt will improve her Rt shoulder ER to >/= 60 degrees to improve her ADL's and functional mobility.  Goal status: New   7.  pt will be able to lift 5 # from counter to over head shelf with her Rt UE with pain </= 2/10.  Goal Status: New  PLAN:  PT FREQUENCY: 1-2x/week  PT DURATION: 10 weeks  PLANNED INTERVENTIONS: Can include 66063- PT Re-evaluation, 97110-Therapeutic exercises, 97530- Therapeutic activity, [id number]- Neuromuscular re-education, 97535- Self Care, 97140- Manual therapy, [phone]- Gait training, [phone]- Orthotic Fit/training, [phone]- Canalith repositioning, [id number]- Aquatic Therapy, 97014- Electrical stimulation (unattended), [id number]- Electrical stimulation (manual), [id number]- Vasopneumatic device, [id number]- Ultrasound, [id number]- Traction (mechanical), [id number]- Ionotophoresis 4mg /ml Dexamethasone, Patient/Family education, Balance training, Stair training, Taping, Dry Needling, Joint mobilization, Joint manipulation, Spinal manipulation, Spinal mobilization, Scar mobilization, Vestibular training, Visual/preceptual remediation/compensation, DME instructions, Cryotherapy, and Moist heat.  All performed as medically necessary.  All included unless contraindicated  PLAN FOR NEXT SESSION:   Monitor symptoms and passive range, may be able to reduce frequency prior to MD return due to improvements.  Check with patient about desire.    PROM for 5-6 weeks post op to match sling use.   MD visit 01/26/2022   Chyrel Masson, PT, DPT, OCS, ATC 01/08/23  8:34 AM  w

## 2023-01-09 ENCOUNTER — Other Ambulatory Visit: Payer: Self-pay | Admitting: Medical

## 2023-01-13 ENCOUNTER — Encounter: Payer: Self-pay | Admitting: Physical Therapy

## 2023-01-13 ENCOUNTER — Ambulatory Visit (INDEPENDENT_AMBULATORY_CARE_PROVIDER_SITE_OTHER): Payer: BC Managed Care – PPO | Admitting: Physical Therapy

## 2023-01-13 DIAGNOSIS — R600 Localized edema: Secondary | ICD-10-CM

## 2023-01-13 DIAGNOSIS — M6281 Muscle weakness (generalized): Secondary | ICD-10-CM | POA: Diagnosis not present

## 2023-01-13 DIAGNOSIS — M25511 Pain in right shoulder: Secondary | ICD-10-CM

## 2023-01-13 NOTE — Therapy (Signed)
 OUTPATIENT PHYSICAL THERAPY TREATMENT   Patient Name: Ariel Sweeney MRN: [id number] DOB:18-Sep-1965, 57 y.o., female Today's Date: 01/13/2023  END OF SESSION:  PT End of Session - 01/13/23 1218     Visit Number 4    Number of Visits 20    Date for PT Re-Evaluation 03/09/23    PT Start Time 1148    PT Stop Time 1226    PT Time Calculation (min) 38 min    Activity Tolerance Patient tolerated treatment well    Behavior During Therapy [facility] for tasks assessed/performed                Past Medical History:  Diagnosis Date   Decreased pulse    normal ABIs 06/2016   Diabetes mellitus without complication (HCC)    Full dentures 08/2018   GERD (gastroesophageal reflux disease)    intermittent   Goiter    Hyperlipidemia    Hypertension    Obesity    Tobacco use    Vitamin D  deficiency    Wears contact lenses    Past Surgical History:  Procedure Laterality Date   BREAST CYST ASPIRATION Right 2017   cyst    CHOLECYSTECTOMY     COLONOSCOPY  09/2015   tubular adenoma, repeat 5 years;  Dr. Gustav Mcgee   COLPOSCOPY     history of abnormal pap in remote past   ROTATOR CUFF REPAIR Right    12/24   STRABISMUS SURGERY     childhood   Patient Active Problem List   Diagnosis Date Noted   Diabetes mellitus type 2 with complications (HCC) 02/05/2022   Screening for lung cancer 06/13/2021   Need for pneumococcal vaccination 05/30/2020   Colon cancer screening 05/30/2020   Hyperlipidemia 09/21/2019   Nasal drainage 09/08/2019   Snoring 09/08/2019   Fatigue 09/08/2019   Sleep disturbance 09/08/2019   Ill-fitting dentures 09/08/2019   Vaccine counseling 09/22/2017   Influenza vaccination declined 09/22/2017   Right arm pain 02/26/2017   Vitamin D  deficiency 07/09/2016   Screening for cervical cancer 07/09/2016   Pain in both thighs 06/23/2016   Decreased pedal pulses 06/23/2016   Encounter for health maintenance examination in adult 06/06/2015   Tobacco use  disorder 06/06/2015   Screening for breast cancer 06/06/2015   Routine general medical examination at a health care facility 06/06/2015   Right shoulder pain 06/06/2015   Essential hypertension 04/17/2014   Goiter 04/17/2014   Obesity 04/17/2014   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   PERTINENT HISTORY: HTN, obesity, vit D deficiency, hyperlipidemia, breast cyst aspiration, strabismus surgery  MRI: Impression: 09/28/22 1. Full-thickness tear of the anterior 13 mm of the supraspinatus tendon footprint. Mild anterior supraspinatus muscle atrophy. 2. Mild tendinosis of the distal critical zone and footprint of the infraspinatus. 3. Very mild superior subscapularis tendinosis. 4. Mild to moderate degenerative changes of the acromioclavicular joint.  Wear sling until 6 weeks post op.   PAIN:  NPRS scale: 3/10 upon arrival Pain location: Rt shoulder Pain description: achy, throbbing Aggravating factors: movement, sleeping, certain positions Relieving factors: meds, changing positions  PRECAUTIONS: Shoulder  WEIGHT BEARING RESTRICTIONS: No  FALLS:  Has patient fallen in last 6 months? No  LIVING ENVIRONMENT: Lives with: lives with their family and lives with their spouse Lives in: House/apartment Stairs: Yes: External: 3 steps; none Has  following equipment at home: None  OCCUPATION: Urgent care reception  PLOF: Independent  PATIENT GOALS:stop hurting, be able to cook and complete house hold chores  Next MD visit:   OBJECTIVE:   DIAGNOSTIC FINDINGS:  12/29/2022 review of chart:   1. Full-thickness tear of the anterior 13 mm of the supraspinatus tendon footprint. Mild anterior supraspinatus muscle atrophy. 2. Mild tendinosis of the distal critical zone and footprint of the infraspinatus. 3. Very mild superior subscapularis tendinosis. 4. Mild to moderate degenerative changes of the acromioclavicular joint.  PATIENT SURVEYS:  12/29/22: FOTO intake:  28%   COGNITION: 12/29/2022 Overall cognitive status: WFL     SENSATION: 12/29/2022 WFL  POSTURE: 12/29/2022 Rounded shoulders and forward head  UPPER EXTREMITY ROM:   ROM Right 12/29/22 Left 12/29/22 Right 01/08/2023 PROM in supine - limited by pain Rt 01/13/23 PROM supine  Shoulder flexion 100 158 128 142  Shoulder extension      Shoulder abduction   100 128  Shoulder adduction      Shoulder internal rotation 60 76 75 75  Shoulder external rotation 30 70 60 64 c pain   Elbow flexion 145 146    Elbow extension 0 0    Wrist flexion      Wrist extension      Wrist ulnar deviation      Wrist radial deviation      Wrist pronation      Wrist supination      (Blank rows = not tested)  UPPER EXTREMITY MMT:  MMT Right 12/29/2022 Left 12/29/2022  Shoulder flexion  4  Shoulder extension  4  Shoulder abduction  4  Shoulder adduction    Shoulder internal rotation  4  Shoulder external rotation  4  Middle trapezius    Lower trapezius    Elbow flexion    Elbow extension    Wrist flexion    Wrist extension    Wrist ulnar deviation    Wrist radial deviation    Wrist pronation    Wrist supination    Grip strength (lbs)    (Blank rows = not tested)  TODAY'S TREATMENT:                                                                                 DATE: 01/13/2023:  Manual Supine Rt shoulder passive range Grade 2-3  inferior joint mobs RT GH joint and posterior glide   Therex: Standing pendulum flexion, abduction, circles both directions x 2 minutes Seated Rt shoulder flexion on table passive range x 10  c 3 sec hold Seated Rt shoulder scaption on table passive range x 10 c 3 sec hold Seated passive Rt shoulder flexion in cradle position x 10   Estim IFC to Rt shoulder to tolerance 10 mins for pain relief       TODAY'S TREATMENT:                                                                                 DATE: 01/08/2023:  Manual Supine Rt shoulder passive range in various directions of elevation.  G2/g3 inferior joint mobs RT GH joint, Posterior glide   Therex: Seated 2 lb ball in lap grip squeeze 5 sec hold on /off x 10 Rt arm Seated pulleys PROM focus with arm close to body 3 min in flexion, 3 min in scaption.  Table slides flexion 5 sec hold x 10, scaption 5 sec hold x 10    TODAY'S TREATMENT:                                                                                 DATE: 01/01/2023:  Manual Supine Rt shoulder passive range in various directions of elevation.  G2/g3 inferior joint mobs RT GH joint, Posterior glide   Therex: Standing pendulum review in all directions 1 min x 2  Seated Rt shoulder flexion on table passive range x 10 2-3 sec hold Seated Rt shoulder scaption on table passive range x 10 2-3 sec hold Verbal review of existing HEP.   Estim IFC to Rt shoulder to tolerance 10 mins for pain relief   TODAY'S TREATMENT:                                                                                 DATE:12/29/22:  Therex:    HEP instruction/performance c cues for techniques, handout provided.  Trial set performed of each for comprehension and symptom assessment.  See below for exercise list   PATIENT EDUCATION: 12/29/2022 Education details: HEP, POC Person educated: Patient Education method: Programmer, Multimedia, Demonstration, Verbal cues, and Handouts Education comprehension: verbalized understanding, returned demonstration, and verbal cues required  HOME EXERCISE PROGRAM: Exercises - Circular Shoulder Pendulum with Table Support  - 3 x daily - 7 x weekly - 30 reps - Horizontal Shoulder Pendulum with Table Support  - 3 x daily - 7 x weekly - 30 reps - Flexion-Extension Shoulder Pendulum with Table Support  - 3 x daily - 7 x weekly - 30 reps - Seated Bilateral Shoulder Flexion Towel Slide at Table Top  - 3 x daily - 7 x weekly - 2 sets - 10 reps  ASSESSMENT:  CLINICAL IMPRESSION: Continued improvements noted in measurements as taken during today's visit. We discussed dropping pt's frequency to 1x/ week due to pt making progress with her HEP. Continue to progress per pt's protocol and treatment plan of care.   OBJECTIVE IMPAIRMENTS: decreased mobility, decreased ROM, decreased strength, increased edema, impaired UE functional use, and pain.   ACTIVITY LIMITATIONS: lifting, sleeping, bed mobility, bathing, toileting, dressing, self feeding, reach over head, and hygiene/grooming  PARTICIPATION LIMITATIONS: meal prep, cleaning, laundry, driving, community activity, and occupation  PERSONAL FACTORS: 3+ comorbidities: see pertinent history above  are also affecting patient's functional outcome.   REHAB POTENTIAL: Good  CLINICAL DECISION MAKING: Stable/uncomplicated  EVALUATION COMPLEXITY: Low   GOALS: Goals reviewed with patient? Yes  SHORT TERM GOALS: (target date for Short term goals are 3 weeks 01/19/2023)  1.Patient will demonstrate independent use of home exercise program to maintain progress from in clinic treatments. Goal status: MET 01/13/23  LONG TERM GOALS: (target  dates for all long term goals are 10 weeks  03/09/2023 )   1. Patient will demonstrate/report pain at worst less than or equal to 2/10 to facilitate minimal limitation in daily activity secondary to pain symptoms. Goal status: New   2. Patient will demonstrate independent use of home exercise program to facilitate ability to maintain/progress functional gains from skilled physical therapy services. Goal status: New   3. Patient will demonstrate FOTO outcome > or = 60 % to indicate reduced disability due to condition. Goal status: New   4.  Patient will demonstrate Rt UE MMT >/= 4/5 throughout to facilitate lifting, reaching, carrying at [facility] in daily activity.   Goal status: New   5.  Patient will demonstrate Rt shoulder flexion to >/= 140 degrees to facilitate ADL's and functional mobility.    Goal status: New   6.  Pt will improve her Rt shoulder ER to >/= 60 degrees to improve her ADL's and functional mobility.  Goal status: New   7.  pt will be able to lift 5 # from counter to over head shelf with her Rt UE with pain </= 2/10.  Goal Status: New  PLAN:  PT FREQUENCY: 1-2x/week  PT DURATION: 10 weeks  PLANNED INTERVENTIONS: Can include 02853- PT Re-evaluation, 97110-Therapeutic exercises, 97530- Therapeutic activity, [id number]- Neuromuscular re-education, 97535- Self Care, 97140- Manual therapy, [phone]- Gait training, [phone]- Orthotic Fit/training, [phone]- Canalith repositioning, [id number]- Aquatic Therapy, 97014- Electrical stimulation (unattended), [id number]- Electrical stimulation (manual), [id number]- Vasopneumatic device, [id number]- Ultrasound, [id number]- Traction (mechanical), [id number]- Ionotophoresis 4mg /ml Dexamethasone, Patient/Family education, Balance training, Stair training, Taping, Dry Needling, Joint mobilization, Joint manipulation, Spinal manipulation, Spinal mobilization, Scar mobilization, Vestibular training, Visual/preceptual remediation/compensation, DME instructions, Cryotherapy, and Moist  heat.  All  performed as medically necessary.  All included unless contraindicated  PLAN FOR NEXT SESSION:   Monitor symptoms and passive range, may be able to reduce frequency prior to MD return due to improvements.  Check with patient about desire.    PROM for 5-6 weeks post op to match sling use.   MD visit 01/26/2022   Delon Lunger, PT, MPT 01/13/23 12:21 PM   01/13/23  12:21 PM  w

## 2023-01-14 ENCOUNTER — Other Ambulatory Visit: Payer: Self-pay | Admitting: Medical

## 2023-01-20 ENCOUNTER — Ambulatory Visit: Payer: BC Managed Care – PPO | Admitting: Physical Therapy

## 2023-01-20 ENCOUNTER — Encounter: Payer: Self-pay | Admitting: Physical Therapy

## 2023-01-20 DIAGNOSIS — R600 Localized edema: Secondary | ICD-10-CM

## 2023-01-20 DIAGNOSIS — M6281 Muscle weakness (generalized): Secondary | ICD-10-CM | POA: Diagnosis not present

## 2023-01-20 DIAGNOSIS — M25511 Pain in right shoulder: Secondary | ICD-10-CM | POA: Diagnosis not present

## 2023-01-20 NOTE — Therapy (Signed)
 OUTPATIENT PHYSICAL THERAPY TREATMENT   Patient Name: Ariel Sweeney MRN: [id number] DOB:[birth year], 58 y.o., female Today's Date: 01/20/2023  END OF SESSION:  PT End of Session - 01/20/23 1331     Visit Number 5    Number of Visits 20    Date for PT Re-Evaluation 03/09/23    Authorization Type [employer] deductible met for 2024 - 30 visits    PT Start Time 1300    PT Stop Time 1338    PT Time Calculation (min) 38 min    Activity Tolerance Patient tolerated treatment well    Behavior During Therapy [facility] for tasks assessed/performed                 Past Medical History:  Diagnosis Date   Decreased pulse    normal ABIs 06/2016   Diabetes mellitus without complication (HCC)    Full dentures 08/2018   GERD (gastroesophageal reflux disease)    intermittent   Goiter    Hyperlipidemia    Hypertension    Obesity    Tobacco use    Vitamin D  deficiency    Wears contact lenses    Past Surgical History:  Procedure Laterality Date   BREAST CYST ASPIRATION Right 2017   cyst    CHOLECYSTECTOMY     COLONOSCOPY  09/2015   tubular adenoma, repeat 5 years;  Dr. Gustav Mcgee   COLPOSCOPY     history of abnormal pap in remote past   ROTATOR CUFF REPAIR Right    12/24   STRABISMUS SURGERY     childhood   Patient Active Problem List   Diagnosis Date Noted   Diabetes mellitus type 2 with complications (HCC) 02/05/2022   Screening for lung cancer 06/13/2021   Need for pneumococcal vaccination 05/30/2020   Colon cancer screening 05/30/2020   Hyperlipidemia 09/21/2019   Nasal drainage 09/08/2019   Snoring 09/08/2019   Fatigue 09/08/2019   Sleep disturbance 09/08/2019   Ill-fitting dentures 09/08/2019   Vaccine counseling 09/22/2017   Influenza vaccination declined 09/22/2017   Right arm pain 02/26/2017   Vitamin D  deficiency 07/09/2016   Screening for cervical cancer 07/09/2016   Pain in both thighs 06/23/2016   Decreased pedal pulses 06/23/2016   Encounter for health  maintenance examination in adult 06/06/2015   Tobacco use disorder 06/06/2015   Screening for breast cancer 06/06/2015   Routine general medical examination at a health care facility 06/06/2015   Right shoulder pain 06/06/2015   Essential hypertension 04/17/2014   Goiter 04/17/2014   Obesity 04/17/2014   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 Pt still wearing her sling.   PERTINENT HISTORY: HTN, obesity, vit D deficiency, hyperlipidemia, breast cyst aspiration, strabismus surgery  MRI: Impression: 09/28/22 1. Full-thickness tear of the anterior 13 mm of the supraspinatus tendon footprint. Mild anterior supraspinatus muscle atrophy. 2. Mild tendinosis of the distal critical zone and footprint of the infraspinatus. 3. Very mild superior subscapularis tendinosis. 4. Mild to moderate degenerative changes of the acromioclavicular joint.  Wear sling until 6 weeks post op.   PAIN:  NPRS scale: 2/10 upon arrival Pain location: Rt shoulder Pain description: achy, throbbing Aggravating factors: movement, sleeping, certain positions Relieving factors: meds, changing positions  PRECAUTIONS: Shoulder  WEIGHT BEARING RESTRICTIONS: No  FALLS:  Has patient fallen in last 6 months? No  LIVING ENVIRONMENT: Lives with: lives with their family and  lives with their spouse Lives in: House/apartment Stairs: Yes: External: 3 steps; none Has following equipment at home: None  OCCUPATION: Urgent care reception  PLOF: Independent  PATIENT GOALS:stop hurting, be able to cook and complete house hold chores  Next MD visit:   OBJECTIVE:   DIAGNOSTIC FINDINGS:  12/29/2022 review of chart:   1. Full-thickness tear of the anterior 13 mm of the supraspinatus tendon footprint. Mild anterior supraspinatus muscle atrophy. 2. Mild tendinosis of the distal critical zone and footprint of the infraspinatus. 3. Very mild superior subscapularis tendinosis. 4. Mild to moderate degenerative changes of the acromioclavicular joint.  PATIENT SURVEYS:  12/29/22: FOTO intake:  28%   COGNITION: 12/29/2022 Overall cognitive status: WFL     SENSATION: 12/29/2022 WFL  POSTURE: 12/29/2022 Rounded shoulders and forward head  UPPER EXTREMITY ROM:   ROM Right 12/29/22 Left 12/29/22 Right 01/08/2023 PROM in supine - limited by pain Rt 01/13/23 PROM supine Rt 01/20/23 PROM supine  Shoulder flexion 100 158 128 142 145  Shoulder extension       Shoulder abduction   100 128 120  Shoulder adduction       Shoulder internal rotation 60 76 75 75 75  Shoulder external rotation 30 70 60 64 c pain  64  Elbow flexion 145 146     Elbow extension 0 0     Wrist flexion       Wrist extension       Wrist ulnar deviation       Wrist radial deviation       Wrist pronation       Wrist supination       (Blank rows = not tested)  UPPER EXTREMITY MMT:  MMT Right 12/29/2022 Left 12/29/2022  Shoulder flexion  4  Shoulder extension  4  Shoulder abduction  4  Shoulder adduction    Shoulder internal rotation  4  Shoulder external rotation  4  Middle trapezius    Lower trapezius    Elbow flexion    Elbow extension    Wrist flexion    Wrist extension    Wrist ulnar deviation    Wrist radial deviation    Wrist pronation    Wrist supination     Grip strength (lbs)    (Blank rows = not tested)  TODAY'S TREATMENT:                                                                                 DATE: 01/19/2022:  Manual Supine Rt shoulder passive range Grade 2-3  inferior joint mobs RT GH joint and posterior glide   Therex: Standing pendulum flexion, abduction, circles both directions x 2 minutes Seated Rt shoulder flexion on table passive range x 10  c 3 sec hold Seated Rt shoulder scaption on table passive range x 10 c 3 sec hold Seated passive Rt shoulder flexion in cradle position x 10  Seated 2# ball squeezes x 10 holding 5 sec    TODAY'S TREATMENT:                                                                                 DATE: 01/13/2023:  Manual Supine Rt shoulder passive range Grade 2-3  inferior joint mobs RT GH joint and posterior glide   Therex: Standing pendulum flexion, abduction, circles both directions x 2 minutes Seated Rt shoulder flexion on table passive range x 10  c 3 sec hold Seated Rt shoulder scaption on table passive range x 10 c 3 sec hold Seated passive Rt shoulder flexion in cradle position x 10   Estim IFC to Rt shoulder to tolerance 10 mins for pain relief       TODAY'S TREATMENT:                                                                                 DATE: 01/08/2023:  Manual Supine Rt shoulder passive range in various directions of elevation.  G2/g3 inferior joint mobs RT GH joint, Posterior glide   Therex: Seated 2 lb ball in lap grip squeeze 5 sec hold on /off x 10 Rt arm Seated pulleys PROM focus with arm close to body 3 min in flexion, 3 min in scaption.  Table slides flexion 5 sec hold x 10, scaption 5 sec hold x 10     PATIENT EDUCATION: 12/29/2022 Education details: HEP,  POC Person educated: Patient Education method: Programmer, Multimedia, Demonstration, Verbal cues, and Handouts Education comprehension: verbalized understanding, returned demonstration, and verbal cues required  HOME EXERCISE PROGRAM: Exercises - Circular Shoulder Pendulum with Table Support  - 3 x daily - 7 x weekly - 30 reps - Horizontal Shoulder Pendulum with Table Support  - 3 x daily - 7 x weekly - 30 reps - Flexion-Extension Shoulder Pendulum with Table Support  - 3 x daily - 7 x weekly - 30 reps - Seated Bilateral Shoulder  Flexion Towel Slide at Table Top  - 3 x daily - 7 x weekly - 2 sets - 10 reps  ASSESSMENT:  CLINICAL IMPRESSION: We reduced pt's frequency to 1x/ week until pt is released by her MD. Pt's follow up visit is next week on 01/27/23. Pt tolerating passive motion and manual therapy well with only mild increase in pain at end range. Continue skilled PT per pt's treatment plan and MD's recommendation.   OBJECTIVE IMPAIRMENTS: decreased mobility, decreased ROM, decreased strength, increased edema, impaired UE functional use, and pain.   ACTIVITY LIMITATIONS: lifting, sleeping, bed mobility, bathing, toileting, dressing, self feeding, reach over head, and hygiene/grooming  PARTICIPATION LIMITATIONS: meal prep, cleaning, laundry, driving, community activity, and occupation  PERSONAL FACTORS: 3+ comorbidities: see pertinent history above  are also affecting patient's functional outcome.   REHAB POTENTIAL: Good  CLINICAL DECISION MAKING: Stable/uncomplicated  EVALUATION COMPLEXITY: Low   GOALS: Goals reviewed with patient? Yes  SHORT TERM GOALS: (target date for Short term goals are 3 weeks 01/19/2023)  1.Patient will demonstrate independent use of home exercise program to maintain progress from in clinic treatments. Goal status: MET 01/13/23  LONG TERM GOALS: (target dates for all long term goals are 10 weeks  03/09/2023 )   1. Patient will demonstrate/report pain at worst  less than or equal to 2/10 to facilitate minimal limitation in daily activity secondary to pain symptoms. Goal status: on-going 01/20/23   2. Patient will demonstrate independent use of home exercise program to facilitate ability to maintain/progress functional gains from skilled physical therapy services. Goal status: New   3. Patient will demonstrate FOTO outcome > or = 60 % to indicate reduced disability due to condition. Goal status: New   4.  Patient will demonstrate Rt UE MMT >/= 4/5 throughout to facilitate lifting, reaching, carrying at [facility] in daily activity.   Goal status: New   5.  Patient will demonstrate Rt shoulder flexion to >/= 140 degrees to facilitate ADL's and functional mobility.    Goal status: New   6.  Pt will improve her Rt shoulder ER to >/= 60 degrees to improve her ADL's and functional mobility.  Goal status: New   7.  pt will be able to lift 5 # from counter to over head shelf with her Rt UE with pain </= 2/10.  Goal Status: New  PLAN:  PT FREQUENCY: 1-2x/week  PT DURATION: 10 weeks  PLANNED INTERVENTIONS: Can include 02853- PT Re-evaluation, 97110-Therapeutic exercises, 97530- Therapeutic activity, [id number]- Neuromuscular re-education, 97535- Self Care, 97140- Manual therapy, [phone]- Gait training, [phone]- Orthotic Fit/training, [phone]- Canalith repositioning, [id number]- Aquatic Therapy, 97014- Electrical stimulation (unattended), [id number]- Electrical stimulation (manual), [id number]- Vasopneumatic device, [id number]- Ultrasound, [id number]- Traction (mechanical), [id number]- Ionotophoresis 4mg /ml Dexamethasone, Patient/Family education, Balance training, Stair training, Taping, Dry Needling, Joint mobilization, Joint manipulation, Spinal manipulation, Spinal mobilization, Scar mobilization, Vestibular training, Visual/preceptual remediation/compensation, DME instructions, Cryotherapy, and Moist heat.  All performed as medically necessary.  All included unless contraindicated  PLAN FOR NEXT  SESSION:   Monitor symptoms and passive range  PROM for 5-6 weeks post op to match sling use.   MD visit 01/26/2022   Delon Lunger, PT, MPT 01/20/23 1:37 PM   01/20/23  1:37 PM

## 2023-01-22 ENCOUNTER — Encounter: Payer: BC Managed Care – PPO | Admitting: Rehabilitative and Restorative Service Providers"

## 2023-01-27 ENCOUNTER — Encounter: Payer: Self-pay | Admitting: Physical Therapy

## 2023-01-27 ENCOUNTER — Ambulatory Visit (INDEPENDENT_AMBULATORY_CARE_PROVIDER_SITE_OTHER): Payer: BC Managed Care – PPO | Admitting: Physician Assistant

## 2023-01-27 ENCOUNTER — Ambulatory Visit (INDEPENDENT_AMBULATORY_CARE_PROVIDER_SITE_OTHER): Payer: BC Managed Care – PPO | Admitting: Physical Therapy

## 2023-01-27 DIAGNOSIS — Z9889 Other specified postprocedural states: Secondary | ICD-10-CM

## 2023-01-27 DIAGNOSIS — M6281 Muscle weakness (generalized): Secondary | ICD-10-CM | POA: Diagnosis not present

## 2023-01-27 DIAGNOSIS — R600 Localized edema: Secondary | ICD-10-CM

## 2023-01-27 DIAGNOSIS — M25511 Pain in right shoulder: Secondary | ICD-10-CM | POA: Diagnosis not present

## 2023-01-27 NOTE — Progress Notes (Signed)
 Post-Op Visit Note   Patient: Ariel Sweeney           Date of Birth: 07/24/1965           MRN: [id number] Visit Date: 01/27/2023 PCP: Bulah Alm RAMAN, PA-C   Assessment & Plan:  Chief Complaint:  Chief Complaint  Patient presents with   Right Shoulder - Follow-up    Right shoulder scope 12/18/2022   Visit Diagnoses:  1. S/P right rotator cuff repair     Plan: Patient is a pleasant 58 year old female who comes in today 6 weeks status post right shoulder arthroscopic rotator cuff repair 12/18/2022.  She has been doing very well.  She has been compliant wearing the sling.  She is only taking Tylenol  for pain.  She has been in physical therapy.  Examination of the right shoulder reveals forward flexion to 150 degrees.  Abduction 130 degrees.  Internal rotation to 75 degrees, external rotation is 60 degrees.  She is neurovascularly intact distally.  At this point, she may discontinue the use of her sling.  She will continue with physical therapy.  We we will release her to work tomorrow lifting to tolerance as she is a scientist, physiological at the local urgent care.  She is left-handed and does not have to lift anything.  Follow-up with us  in 6 weeks for recheck.  Call with concerns or questions.  Follow-Up Instructions: Return in about 6 weeks (around 03/10/2023).   Orders:  No orders of the defined types were placed in this encounter.  No orders of the defined types were placed in this encounter.   Imaging: No new imaging  PMFS History: Patient Active Problem List   Diagnosis Date Noted   Diabetes mellitus type 2 with complications (HCC) 02/05/2022   Screening for lung cancer 06/13/2021   Need for pneumococcal vaccination 05/30/2020   Colon cancer screening 05/30/2020   Hyperlipidemia 09/21/2019   Nasal drainage 09/08/2019   Snoring 09/08/2019   Fatigue 09/08/2019   Sleep disturbance 09/08/2019   Ill-fitting dentures 09/08/2019   Vaccine counseling 09/22/2017   Influenza  vaccination declined 09/22/2017   Right arm pain 02/26/2017   Vitamin D  deficiency 07/09/2016   Screening for cervical cancer 07/09/2016   Pain in both thighs 06/23/2016   Decreased pedal pulses 06/23/2016   Encounter for health maintenance examination in adult 06/06/2015   Tobacco use disorder 06/06/2015   Screening for breast cancer 06/06/2015   Routine general medical examination at a health care facility 06/06/2015   Right shoulder pain 06/06/2015   Essential hypertension 04/17/2014   Goiter 04/17/2014   Obesity 04/17/2014   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   Past Medical History:  Diagnosis Date   Decreased pulse    normal ABIs 06/2016   Diabetes mellitus without complication (HCC)    Full dentures 08/2018   GERD (gastroesophageal reflux disease)    intermittent   Goiter    Hyperlipidemia    Hypertension    Obesity    Tobacco use    Vitamin D  deficiency    Wears contact lenses     Family History  Problem Relation Age of Onset   Diabetes Mother    Hypertension Mother    Kidney disease Mother    Anemia Mother    Hypertension Father    Diabetes Father    Heart disease Father    Hypertension Sister    Stroke Maternal Aunt    Cancer Neg Hx    Colon cancer Neg Hx  Colon polyps Neg Hx    Esophageal cancer Neg Hx    Rectal cancer Neg Hx    Stomach cancer Neg Hx     Past Surgical History:  Procedure Laterality Date   BREAST CYST ASPIRATION Right 2017   cyst    CHOLECYSTECTOMY     COLONOSCOPY  09/2015   tubular adenoma, repeat 5 years;  Dr. Gustav Mcgee   COLPOSCOPY     history of abnormal pap in remote past   ROTATOR CUFF REPAIR Right    12/24   STRABISMUS SURGERY     childhood   Social History   Occupational History   Not on file  Tobacco Use   Smoking status: Some Days    Current packs/day: 0.25    Average packs/day: 0.3 packs/day for 27.0 years (6.8 ttl pk-yrs)    Types: Cigarettes   Smokeless tobacco: Never  Vaping Use   Vaping status: Never Used   Substance and Sexual Activity   Alcohol use: Yes    Alcohol/week: 3.0 standard drinks of alcohol    Types: 1 Glasses of wine, 1 Cans of beer, 1 Shots of liquor per week    Comment: 4 weekly   Drug use: No   Sexual activity: Not on file

## 2023-01-27 NOTE — Therapy (Signed)
 OUTPATIENT PHYSICAL THERAPY TREATMENT   Patient Name: Ariel Sweeney MRN: [id number] DOB:01/13/1966, 58 y.o., female Today's Date: 01/27/2023  Progress Note Reporting Period 12/29/22 to 01/27/2023   See note below for Objective Data and Assessment of Progress/Goals.      END OF SESSION:  PT End of Session - 01/27/23 0850     Visit Number 6    Number of Visits 20    Date for PT Re-Evaluation 03/09/23    Authorization Type [employer] deductible met for 2024 - 30 visits    PT Start Time 0844    PT Stop Time 0922    PT Time Calculation (min) 38 min    Activity Tolerance Patient tolerated treatment well    Behavior During Therapy [facility] for tasks assessed/performed                 Past Medical History:  Diagnosis Date   Decreased pulse    normal ABIs 06/2016   Diabetes mellitus without complication (HCC)    Full dentures 08/2018   GERD (gastroesophageal reflux disease)    intermittent   Goiter    Hyperlipidemia    Hypertension    Obesity    Tobacco use    Vitamin D  deficiency    Wears contact lenses    Past Surgical History:  Procedure Laterality Date   BREAST CYST ASPIRATION Right 2017   cyst    CHOLECYSTECTOMY     COLONOSCOPY  09/2015   tubular adenoma, repeat 5 years;  Dr. Gustav Mcgee   COLPOSCOPY     history of abnormal pap in remote past   ROTATOR CUFF REPAIR Right    12/24   STRABISMUS SURGERY     childhood   Patient Active Problem List   Diagnosis Date Noted   Diabetes mellitus type 2 with complications (HCC) 02/05/2022   Screening for lung cancer 06/13/2021   Need for pneumococcal vaccination 05/30/2020   Colon cancer screening 05/30/2020   Hyperlipidemia 09/21/2019   Nasal drainage 09/08/2019   Snoring 09/08/2019   Fatigue 09/08/2019   Sleep disturbance 09/08/2019   Ill-fitting dentures 09/08/2019   Vaccine counseling 09/22/2017   Influenza vaccination declined 09/22/2017   Right arm pain 02/26/2017   Vitamin D  deficiency 07/09/2016    Screening for cervical cancer 07/09/2016   Pain in both thighs 06/23/2016   Decreased pedal pulses 06/23/2016   Encounter for health maintenance examination in adult 06/06/2015   Tobacco use disorder 06/06/2015   Screening for breast cancer 06/06/2015   Routine general medical examination at a health care facility 06/06/2015   Right shoulder pain 06/06/2015   Essential hypertension 04/17/2014   Goiter 04/17/2014   Obesity 04/17/2014   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 Pt with appointment with Dr Jerri following session today.   PERTINENT HISTORY: HTN, obesity, vit D deficiency, hyperlipidemia, breast cyst aspiration, strabismus surgery  MRI: Impression: 09/28/22 1. Full-thickness tear of the anterior 13 mm of the supraspinatus tendon footprint. Mild anterior supraspinatus muscle atrophy. 2. Mild tendinosis of the distal critical zone and footprint of the infraspinatus. 3. Very mild superior subscapularis tendinosis. 4. Mild to moderate degenerative changes of the acromioclavicular joint.  Wear sling until 6 weeks post op.   PAIN:  NPRS scale: 2/10 today, worse pain 4/10 over the last week Pain location: Rt shoulder Pain description: achy, throbbing Aggravating factors: movement, sleeping, certain positions Relieving factors: meds, changing  positions  PRECAUTIONS: Shoulder  WEIGHT BEARING RESTRICTIONS: No  FALLS:  Has patient fallen in last 6 months? No  LIVING ENVIRONMENT: Lives with: lives with their family and lives with their spouse Lives in: House/apartment Stairs: Yes: External: 3 steps; none Has following equipment at home: None  OCCUPATION: Urgent care reception  PLOF: Independent  PATIENT GOALS:stop hurting, be able to cook and complete house hold chores  Next MD visit:   OBJECTIVE:   DIAGNOSTIC FINDINGS:  12/29/2022 review of chart:   1. Full-thickness tear of the anterior 13 mm of the supraspinatus tendon footprint. Mild anterior supraspinatus muscle atrophy. 2. Mild tendinosis of the distal critical zone and footprint of the infraspinatus. 3. Very mild superior subscapularis tendinosis. 4. Mild to moderate degenerative changes of the acromioclavicular joint.  PATIENT SURVEYS:  12/29/22: FOTO intake:  28%  01/27/23: FOTO update: 42%  COGNITION: 12/29/2022 Overall cognitive status: WFL     SENSATION: 12/29/2022 WFL  POSTURE: 12/29/2022 Rounded shoulders and forward head  UPPER EXTREMITY ROM:   ROM Right 12/29/22 Left 12/29/22 Right 01/08/2023 PROM in supine - limited by pain Rt 01/13/23 PROM supine Rt 01/20/23 PROM supine Rt 01/27/23 PROM supine  Shoulder flexion 100 158 128 142 145 152  Shoulder extension        Shoulder abduction   100 128 120 130  Shoulder adduction        Shoulder internal rotation 60 76 75 75 75 75 Shoulder abd 45 deg  Shoulder external rotation 30 70 60 64 c pain  64 60 shoulder abd 45 deg  Elbow flexion 145 146      Elbow extension 0 0      Wrist flexion        Wrist extension        Wrist ulnar deviation        Wrist radial deviation        Wrist pronation        Wrist supination        (Blank rows = not tested)  UPPER EXTREMITY MMT:  MMT Right 12/29/2022 Left 12/29/2022  Shoulder flexion  4  Shoulder extension  4  Shoulder  abduction  4  Shoulder adduction    Shoulder internal rotation  4  Shoulder external rotation  4  Middle trapezius    Lower trapezius    Elbow flexion    Elbow extension    Wrist flexion    Wrist extension    Wrist ulnar deviation    Wrist radial deviation    Wrist pronation    Wrist supination    Grip strength (lbs)    (Blank rows = not tested)  TODAY'S TREATMENT:                                                                                 DATE: 01/26/2022:  Manual Supine Rt shoulder passive range Grade 2-3  inferior joint mobs RT GH joint and posterior glide   Therex: Standing pendulum flexion, abduction, circles both directions x 2 minutes Standing Rt shoulder flexion on table passive range x 15 using a green therapy ball on table top c 3 sec hold Standing Rt shoulder scaption on green therapy ball passive range x 15 c 3 sec hold Seated Rt shoulder scaption on table passive range x 10 c 3 sec hold Seated passive Rt shoulder flexion in cradle position x 10  Seated 2# ball squeezes x 10 holding 5 sec       TODAY'S TREATMENT:                                                                                 DATE: 01/19/2022:  Manual Supine Rt shoulder passive range Grade 2-3  inferior joint mobs RT GH joint and posterior glide   Therex: Standing pendulum flexion, abduction, circles both directions x 2 minutes Seated Rt shoulder flexion on table passive range x 10  c 3 sec hold Seated Rt shoulder scaption on table passive range x 10 c 3 sec hold Seated passive Rt shoulder flexion in cradle position x 10  Seated 2# ball squeezes x 10 holding 5 sec       PATIENT EDUCATION: 12/29/2022 Education details: HEP, POC Person educated: Patient Education method: Programmer, Multimedia,  Facilities Manager, Verbal cues, and Handouts Education comprehension: verbalized understanding, returned demonstration, and verbal cues required  HOME EXERCISE PROGRAM: Exercises - Circular Shoulder Pendulum with Table Support  - 3 x daily - 7 x weekly - 30 reps - Horizontal Shoulder Pendulum with Table Support  - 3 x daily - 7 x weekly - 30 reps - Flexion-Extension Shoulder Pendulum with Table Support  - 3 x daily - 7 x weekly - 30 reps - Seated Bilateral Shoulder Flexion Towel Slide at Table Top  - 3 x daily - 7 x weekly - 2 sets - 10 reps  ASSESSMENT:  CLINICAL IMPRESSION: Pt to see Dr Jerri following her session today. Pt reporting 2/10 pain in her Rt shoulder. Pt's FOTO score has increased to 42% from 28% at evaluation. Pt tolerating passive motion well with progress in her ROM. Continue skilled PT per pt's treatment plan and MD's recommendation.   OBJECTIVE IMPAIRMENTS: decreased mobility, decreased ROM, decreased strength, increased edema, impaired UE functional use, and pain.   ACTIVITY LIMITATIONS: lifting, sleeping, bed mobility, bathing, toileting, dressing, self feeding, reach over head, and hygiene/grooming  PARTICIPATION LIMITATIONS: meal prep, cleaning, laundry, driving, community activity, and occupation  PERSONAL FACTORS: 3+ comorbidities: see pertinent history above  are also affecting patient's functional outcome.  REHAB POTENTIAL: Good  CLINICAL DECISION MAKING: Stable/uncomplicated  EVALUATION COMPLEXITY: Low   GOALS: Goals reviewed with patient? Yes  SHORT TERM GOALS: (target date for Short term goals are 3 weeks 01/19/2023)  1.Patient will demonstrate independent use of home exercise program to maintain progress from in clinic treatments. Goal status: MET 01/13/23  LONG TERM GOALS: (target dates for all long term goals are 10 weeks  03/09/2023 )   1. Patient will demonstrate/report pain at worst less than or equal to 2/10 to facilitate minimal limitation in daily  activity secondary to pain symptoms. Goal status: on-going 01/27/23   2. Patient will demonstrate independent use of home exercise program to facilitate ability to maintain/progress functional gains from skilled physical therapy services. Goal status: New   3. Patient will demonstrate FOTO outcome > or = 60 % to indicate reduced disability due to condition. Goal status:  On-going 01/27/23   4.  Patient will demonstrate Rt UE MMT >/= 4/5 throughout to facilitate lifting, reaching, carrying at [facility] in daily activity.   Goal status: New   5.  Patient will demonstrate Rt shoulder flexion to >/= 140 degrees to facilitate ADL's and functional mobility.    Goal status: New   6.  Pt will improve her Rt shoulder ER to >/= 60 degrees to improve her ADL's and functional mobility.  Goal status: New   7.  pt will be able to lift 5 # from counter to over head shelf with her Rt UE with pain </= 2/10.  Goal Status: New  PLAN:  PT FREQUENCY: 1-2x/week  PT DURATION: 10 weeks  PLANNED INTERVENTIONS: Can include 02853- PT Re-evaluation, 97110-Therapeutic exercises, 97530- Therapeutic activity, [id number]- Neuromuscular re-education, 97535- Self Care, 97140- Manual therapy, [phone]- Gait training, [phone]- Orthotic Fit/training, [phone]- Canalith repositioning, [id number]- Aquatic Therapy, 97014- Electrical stimulation (unattended), [id number]- Electrical stimulation (manual), [id number]- Vasopneumatic device, [id number]- Ultrasound, [id number]- Traction (mechanical), [id number]- Ionotophoresis 4mg /ml Dexamethasone, Patient/Family education, Balance training, Stair training, Taping, Dry Needling, Joint mobilization, Joint manipulation, Spinal manipulation, Spinal mobilization, Scar mobilization, Vestibular training, Visual/preceptual remediation/compensation, DME instructions, Cryotherapy, and Moist heat.  All performed as medically necessary.  All included unless contraindicated  PLAN FOR NEXT SESSION:   Monitor symptoms and passive range  PROM  for 5-6 weeks post op to match sling use.   Check to see MD recommendations following pts appointment on 01/27/23.    Delon Lunger, PT, MPT 01/27/23 9:22 AM   01/27/23  9:22 AM

## 2023-01-30 ENCOUNTER — Encounter: Payer: BC Managed Care – PPO | Admitting: Rehabilitative and Restorative Service Providers"

## 2023-02-02 ENCOUNTER — Encounter: Payer: Self-pay | Admitting: Gastroenterology

## 2023-02-02 ENCOUNTER — Encounter: Payer: BC Managed Care – PPO | Admitting: Physical Therapy

## 2023-02-03 ENCOUNTER — Ambulatory Visit (INDEPENDENT_AMBULATORY_CARE_PROVIDER_SITE_OTHER): Payer: BC Managed Care – PPO | Admitting: Rehabilitative and Restorative Service Providers"

## 2023-02-03 ENCOUNTER — Encounter: Payer: Self-pay | Admitting: Rehabilitative and Restorative Service Providers"

## 2023-02-03 DIAGNOSIS — R600 Localized edema: Secondary | ICD-10-CM | POA: Diagnosis not present

## 2023-02-03 DIAGNOSIS — M6281 Muscle weakness (generalized): Secondary | ICD-10-CM

## 2023-02-03 DIAGNOSIS — M25511 Pain in right shoulder: Secondary | ICD-10-CM

## 2023-02-03 NOTE — Therapy (Signed)
OUTPATIENT PHYSICAL THERAPY TREATMENT   Patient Name: Ariel Sweeney MRN: [id number] DOB:1965/02/26, 58 y.o., female Today's Date: 02/03/2023   END OF SESSION:  PT End of Session - 02/03/23 1251     Visit Number 7    Number of Visits 20    Date for PT Re-Evaluation 03/09/23    Authorization Type [employer] deductible met for 2024 - 30 visits    Authorization Time Period PN sent on 1/14 at 6th visit    Progress Note Due on Visit 16    PT Start Time 1258    PT Stop Time 1338    PT Time Calculation (min) 40 min    Activity Tolerance Patient tolerated treatment well    Behavior During Therapy [facility] for tasks assessed/performed                  Past Medical History:  Diagnosis Date   Decreased pulse    normal ABIs 06/2016   Diabetes mellitus without complication (HCC)    Full dentures 08/2018   GERD (gastroesophageal reflux disease)    intermittent   Goiter    Hyperlipidemia    Hypertension    Obesity    Tobacco use    Vitamin D deficiency    Wears contact lenses    Past Surgical History:  Procedure Laterality Date   BREAST CYST ASPIRATION Right 2017   cyst    CHOLECYSTECTOMY     COLONOSCOPY  09/2015   tubular adenoma, repeat 5 years;  Dr. Marsa Aris   COLPOSCOPY     history of abnormal pap in remote past   ROTATOR CUFF REPAIR Right    12/24   STRABISMUS SURGERY     childhood   Patient Active Problem List   Diagnosis Date Noted   Diabetes mellitus type 2 with complications (HCC) 02/05/2022   Screening for lung cancer 06/13/2021   Need for pneumococcal vaccination 05/30/2020   Colon cancer screening 05/30/2020   Hyperlipidemia 09/21/2019   Nasal drainage 09/08/2019   Snoring 09/08/2019   Fatigue 09/08/2019   Sleep disturbance 09/08/2019   Ill-fitting dentures 09/08/2019   Vaccine counseling 09/22/2017   Influenza vaccination declined 09/22/2017   Right arm pain 02/26/2017   Vitamin D deficiency 07/09/2016   Screening for cervical cancer  07/09/2016   Pain in both thighs 06/23/2016   Decreased pedal pulses 06/23/2016   Encounter for health maintenance examination in adult 06/06/2015   Tobacco use disorder 06/06/2015   Screening for breast cancer 06/06/2015   Routine general medical examination at a health care facility 06/06/2015   Right shoulder pain 06/06/2015   Essential hypertension 04/17/2014   Goiter 04/17/2014   Obesity 04/17/2014   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  Back to work with minimal lifting.  Now without the sling.   PERTINENT HISTORY: HTN, obesity, vit D deficiency, hyperlipidemia, breast cyst aspiration, strabismus surgery  PAIN:  NPRS scale: 2-3/10 Pain location: Rt shoulder Pain description: achy, throbbing Aggravating factors: movement, sleeping, certain positions Relieving factors: meds, changing positions  PRECAUTIONS: Shoulder  WEIGHT BEARING RESTRICTIONS: No  FALLS:  Has patient fallen in last 6 months? No  LIVING ENVIRONMENT: Lives with: lives with their family and lives with their spouse Lives in: House/apartment Stairs: Yes: External: 3 steps; none Has following equipment at home: None  OCCUPATION: Urgent care reception  PLOF: Independent  PATIENT GOALS:stop hurting, be able to cook and complete house hold chores  Next MD visit:    OBJECTIVE:   DIAGNOSTIC FINDINGS:  12/29/2022 review of chart:   1. Full-thickness tear of the anterior 13 mm of the supraspinatus tendon footprint. Mild anterior supraspinatus muscle atrophy. 2. Mild tendinosis of the distal critical zone and footprint of the infraspinatus. 3. Very mild superior subscapularis tendinosis. 4. Mild to moderate degenerative changes of the acromioclavicular joint.  PATIENT SURVEYS:  12/29/22: FOTO intake:  28%  01/27/23: FOTO update: 42%  COGNITION: 12/29/2022 Overall cognitive status: WFL     SENSATION: 12/29/2022 WFL  POSTURE: 12/29/2022 Rounded shoulders and forward head  UPPER EXTREMITY ROM:   ROM Right 12/29/22 Left 12/29/22 Right 01/08/2023 PROM in supine - limited by pain Rt 01/13/23 PROM supine Rt 01/20/23 PROM supine Rt 01/27/23 PROM supine  Shoulder flexion 100 158 128 142 145 152  Shoulder extension        Shoulder abduction   100 128 120 130  Shoulder adduction        Shoulder internal rotation 60 76 75 75 75 75 Shoulder abd 45 deg  Shoulder external rotation 30 70 60 64 c pain  64 60 shoulder abd 45 deg  Elbow flexion 145 146      Elbow extension 0 0      Wrist flexion        Wrist extension        Wrist ulnar deviation        Wrist radial deviation        Wrist pronation        Wrist supination        (Blank rows = not tested)  UPPER EXTREMITY MMT:  MMT Right 12/29/2022 Left 12/29/2022 Right 02/03/2023  Shoulder flexion  4 2+/5  Shoulder extension  4   Shoulder abduction  4   Shoulder adduction     Shoulder internal rotation  4   Shoulder external rotation  4   Middle trapezius     Lower trapezius     Elbow flexion     Elbow extension     Wrist flexion     Wrist extension     Wrist ulnar deviation     Wrist radial deviation     Wrist pronation     Wrist supination     Grip strength (lbs)     (Blank rows = not tested)  TODAY'S TREATMENT:                                                                                 DATE: 02/03/2023:  Manual Supine Rt shoulder passive range Grade 2-3  inferior joint mobs Rt GH joint and posterior glide   Therex: UBE UE only fwd/back each way with 1 min rest between lvl 2.0 Supine active Rt shoulder flexion to fatigue x15 in 90 seconds, x 23 in 2:53 seconds (cues for home use)   Supine wand 1 lb AAROM flexion x 15 (cues for home) Supine 90 deg flexion small circles cw, ccw 30 x each way Standing green band bilateral rows 2 x 15 Standing Rt arm ER isometric 5 sec on/off x 12 with towel under arm  Standing Rt arm extended at side extension isometric lower trap 5 sec on/off x 12   TODAY'S TREATMENT:                                                                                 DATE: 01/26/2022:  Manual Supine Rt shoulder passive range Grade 2-3  inferior joint mobs RT GH joint and posterior glide   Therex: Standing pendulum flexion, abduction, circles both directions x 2 minutes Standing Rt shoulder flexion on table passive range x 15 using a green therapy ball on table top c 3 sec hold Standing Rt shoulder scaption on green therapy ball passive range x 15 c 3 sec hold Seated Rt shoulder scaption on table passive range x 10 c 3 sec hold Seated passive Rt shoulder flexion in cradle position x 10  Seated 2# ball squeezes x 10 holding 5 sec  TODAY'S TREATMENT:                                                                                 DATE: 01/19/2022:  Manual Supine Rt shoulder passive range Grade 2-3  inferior joint mobs RT GH joint and posterior glide   Therex: Standing pendulum flexion, abduction, circles both directions x 2 minutes Seated Rt shoulder flexion on table passive range x 10  c 3 sec hold Seated Rt shoulder scaption on table  passive range x 10 c 3 sec hold Seated passive Rt shoulder flexion in cradle position x 10  Seated 2# ball squeezes x 10 holding 5 sec  PATIENT EDUCATION: 02/03/2023  Education details: HEP update Person educated: Patient Education method: Programmer, multimedia, Demonstration, Verbal cues, and Handouts Education comprehension: verbalized understanding, returned demonstration, and verbal cues required  HOME EXERCISE PROGRAM: Access Code: [email or web address] URL: [email or web address] Date:  02/03/2023 Prepared by: Chyrel Masson  Exercises - Supine Shoulder Flexion Extension AAROM with Dowel  - 2-3 x daily - 7 x weekly - 1-2 sets - 10-15 reps - 3 hold - Supine Shoulder Flexion Extension Full Range AROM  - 1-2 x daily - 7 x weekly - 1-2 sets - 15-50 reps - Standing Bilateral Low Shoulder Row with Anchored Resistance  - 1-2 x daily - 7 x weekly - 2-3 sets - 10-15 reps - Standing Isometric Shoulder Extension at Table  - 2-3 x daily - 7 x weekly - 1 sets - 10-15 reps - 5-10 hold - Standing Isometric Shoulder External Rotation with Doorway (Mirrored)  - 2-3 x daily - 7 x weekly - 1 sets - 10-15 reps - 5-10 hold  ASSESSMENT:  CLINICAL IMPRESSION: Introduced active range in activity today due to recent progression noted from PA visit.  Good overall tolerance noted in activity with some mild complaints here and there in muscle activation but improved c practice.  Added to HEP new intervention and will reassess next visit.  Continued skilled PT services warranted at this time.   OBJECTIVE IMPAIRMENTS: decreased mobility, decreased ROM, decreased strength, increased edema, impaired UE functional use, and pain.   ACTIVITY LIMITATIONS: lifting, sleeping, bed mobility, bathing, toileting, dressing, self feeding, reach over head, and hygiene/grooming  PARTICIPATION LIMITATIONS: meal prep, cleaning, laundry, driving, community activity, and occupation  PERSONAL FACTORS: 3+ comorbidities: see pertinent  history above  are also affecting patient's functional outcome.   REHAB POTENTIAL: Good  CLINICAL DECISION MAKING: Stable/uncomplicated  EVALUATION COMPLEXITY: Low   GOALS: Goals reviewed with patient? Yes  SHORT TERM GOALS: (target date for Short term goals are 3 weeks 01/19/2023)  1.Patient will demonstrate independent use of home exercise program to maintain progress from in clinic treatments. Goal status: MET 01/13/23  LONG TERM GOALS: (target dates for all long term goals are 10 weeks  03/09/2023 )   1. Patient will demonstrate/report pain at worst less than or equal to 2/10 to facilitate minimal limitation in daily activity secondary to pain symptoms. Goal status: on-going 01/27/23   2. Patient will demonstrate independent use of home exercise program to facilitate ability to maintain/progress functional gains from skilled physical therapy services. Goal status: on going 02/03/2023   3. Patient will demonstrate FOTO outcome > or = 60 % to indicate reduced disability due to condition. Goal status:  on going 02/03/2023   4.  Patient will demonstrate Rt UE MMT >/= 4/5 throughout to facilitate lifting, reaching, carrying at [facility] in daily activity.   Goal status: on going 02/03/2023   5.  Patient will demonstrate Rt shoulder flexion to >/= 140 degrees to facilitate ADL's and functional mobility.    Goal status: on going 02/03/2023   6.  Pt will improve her Rt shoulder ER to >/= 60 degrees to improve her ADL's and functional mobility.  Goal status: Met    7.  pt will be able to lift 5 # from counter to over head shelf with her Rt UE with pain </= 2/10.  Goal Status: on going 02/03/2023  PLAN:  PT FREQUENCY: 1-2x/week  PT DURATION: 10 weeks  PLANNED INTERVENTIONS: Can include 30865- PT Re-evaluation, 97110-Therapeutic exercises, 97530- Therapeutic activity, 97112- Neuromuscular re-education, 97535- Self Care, 97140- Manual therapy, [id number]- Gait training, [phone]- Orthotic  Fit/training, [phone]- Canalith repositioning, [id number]- Aquatic Therapy, 97014- Electrical stimulation (unattended), [id number]- Electrical stimulation (manual), [id number]- Vasopneumatic device, [id number]- Ultrasound, [id number]- Traction (mechanical), [id number]- Ionotophoresis 4mg /ml Dexamethasone, Patient/Family  education, Balance training, Stair training, Taping, Dry Needling, Joint mobilization, Joint manipulation, Spinal manipulation, Spinal mobilization, Scar mobilization, Vestibular training, Visual/preceptual remediation/compensation, DME instructions, Cryotherapy, and Moist heat.  All performed as medically necessary.  All included unless contraindicated  PLAN FOR NEXT SESSION:   Active range progression.   Lifting to tolerance per PA visit note on 01/27/2023.   Chyrel Masson, PT, DPT, OCS, ATC 02/03/23  1:42 PM

## 2023-02-04 ENCOUNTER — Ambulatory Visit (AMBULATORY_SURGERY_CENTER): Payer: BC Managed Care – PPO | Admitting: Gastroenterology

## 2023-02-04 ENCOUNTER — Encounter: Payer: Self-pay | Admitting: Gastroenterology

## 2023-02-04 VITALS — BP 128/82 | HR 65 | Temp 97.3°F | Resp 15 | Ht 62.0 in | Wt 185.0 lb

## 2023-02-04 DIAGNOSIS — Z860100 Personal history of colon polyps, unspecified: Secondary | ICD-10-CM

## 2023-02-04 DIAGNOSIS — K644 Residual hemorrhoidal skin tags: Secondary | ICD-10-CM | POA: Diagnosis not present

## 2023-02-04 DIAGNOSIS — K648 Other hemorrhoids: Secondary | ICD-10-CM | POA: Diagnosis not present

## 2023-02-04 DIAGNOSIS — Z1211 Encounter for screening for malignant neoplasm of colon: Secondary | ICD-10-CM | POA: Diagnosis not present

## 2023-02-04 DIAGNOSIS — K5730 Diverticulosis of large intestine without perforation or abscess without bleeding: Secondary | ICD-10-CM

## 2023-02-04 DIAGNOSIS — Z860101 Personal history of adenomatous and serrated colon polyps: Secondary | ICD-10-CM

## 2023-02-04 MED ORDER — SODIUM CHLORIDE 0.9 % IV SOLN: 500.0000 mL | Freq: Once | INTRAVENOUS | Status: DC

## 2023-02-04 NOTE — Op Note (Signed)
[facility] Patient Name: Ariel Sweeney Procedure Date: 02/04/2023 10:21 AM MRN: [id number] Endoscopist: Napoleon Form , MD, [id number] Age: 58 Referring MD:  Date of Birth: 02-24-1965 Gender: Female Account #: [id number] Procedure:                Colonoscopy Indications:              High risk colon cancer surveillance: Personal                            history of colonic polyps, High risk colon cancer                            surveillance: Personal history of adenoma (10 mm or                            greater in size) Medicines:                Monitored Anesthesia Care Procedure:                Pre-Anesthesia Assessment:                           - Prior to the procedure, a History and Physical                            was performed, and patient medications and                            allergies were reviewed. The patient's tolerance of                            previous anesthesia was also reviewed. The risks                            and benefits of the procedure and the sedation                            options and risks were discussed with the patient.                            All questions were answered, and informed consent                            was obtained. Prior Anticoagulants: The patient has                            taken no anticoagulant or antiplatelet agents. ASA                            Grade Assessment: II - A patient with mild systemic                            disease. After reviewing the risks and benefits,  the patient was deemed in satisfactory condition to                            undergo the procedure.                           After obtaining informed consent, the colonoscope                            was passed under direct vision. Throughout the                            procedure, the patient's blood pressure, pulse, and                            oxygen saturations were monitored  continuously. The                            [id number]-[id number] Colonoscope [id number] was introduced                            through the anus and advanced to the the cecum,                            identified by appendiceal orifice and ileocecal                            valve. The colonoscopy was performed without                            difficulty. The patient tolerated the procedure                            well. The quality of the bowel preparation was                            good. The ileocecal valve, appendiceal orifice, and                            rectum were photographed. Scope In: 10:37:09 AM Scope Out: 10:55:00 AM Scope Withdrawal Time: 0 hours 11 minutes 6 seconds  Total Procedure Duration: 0 hours 17 minutes 51 seconds  Findings:                 The perianal and digital rectal examinations were                            normal.                           Scattered small-mouthed diverticula were found in                            the sigmoid colon.  Non-bleeding external and internal hemorrhoids were                            found during retroflexion. The hemorrhoids were                            medium-sized.                           The exam was otherwise without abnormality. Complications:            No immediate complications. Estimated Blood Loss:     Estimated blood loss was minimal. Impression:               - Moderate diverticulosis in the sigmoid colon.                           - Non-bleeding external and internal hemorrhoids.                           - The examination was otherwise normal.                           - No specimens collected. Recommendation:           - Patient has a contact number available for                            emergencies. The signs and symptoms of potential                            delayed complications were discussed with the                            patient. Return to normal activities  tomorrow.                            Written discharge instructions were provided to the                            patient.                           - Resume previous diet.                           - Continue present medications.                           - Repeat colonoscopy in 5 years for surveillance. Napoleon Form, MD 02/04/2023 11:00:41 AM This report has been signed electronically.

## 2023-02-04 NOTE — Progress Notes (Signed)
Sedate, gd SR, tolerated procedure well, VSS, report to RN 

## 2023-02-04 NOTE — Progress Notes (Signed)
Pt's states no medical or surgical changes since previsit or office visit. 

## 2023-02-04 NOTE — Progress Notes (Signed)
[facility] History and Physical   Primary Care Physician:  Jac Canavan, PA-C   Reason for Procedure:  History of adenomatous colon polyps  Plan:    Surveillance colonoscopy with possible interventions as needed     HPI: Ariel Sweeney is a very pleasant 58 y.o. female here for surveillance colonoscopy. Denies any nausea, vomiting, abdominal pain, melena or bright red blood per rectum  The risks and benefits as well as alternatives of endoscopic procedure(s) have been discussed and reviewed. All questions answered. The patient agrees to proceed.    Past Medical History:  Diagnosis Date   Decreased pulse    normal ABIs 06/2016   Diabetes mellitus without complication (HCC)    Full dentures 08/2018   GERD (gastroesophageal reflux disease)    intermittent   Goiter    Hyperlipidemia    Hypertension    Obesity    Tobacco use    Vitamin D deficiency    Wears contact lenses     Past Surgical History:  Procedure Laterality Date   BREAST CYST ASPIRATION Right 2017   cyst    CHOLECYSTECTOMY     COLONOSCOPY  09/2015   tubular adenoma, repeat 5 years;  Dr. Marsa Aris   COLPOSCOPY     history of abnormal pap in remote past   ROTATOR CUFF REPAIR Right    12/24   STRABISMUS SURGERY     childhood    Prior to Admission medications   Medication Sig Start Date End Date Taking? Authorizing Provider  amLODipine (NORVASC) 10 MG tablet Take 1 tablet (10 mg total) by mouth daily. 10/17/22  Yes Tysinger, Kermit Balo, PA-C  aspirin EC (EQ ASPIRIN ADULT LOW DOSE) 81 MG tablet Take 1 tablet (81 mg total) by mouth daily. Swallow whole. 10/17/22  Yes Tysinger, Kermit Balo, PA-C  Cyanocobalamin 2500 MCG TABS Take by mouth. 11/26/16  Yes [provider]  fenofibrate 54 MG tablet Take 1 tablet (54 mg total) by mouth daily. 10/22/22  Yes Tysinger, Kermit Balo, PA-C  methocarbamol (ROBAXIN-750) 750 MG tablet Take 1 tablet (750 mg total) by mouth 2 (two) times daily as needed  for muscle spasms. 12/25/22  Yes Cristie Hem, PA-C  oxyCODONE-acetaminophen (PERCOCET) 5-325 MG tablet Take 1 tablet by mouth every 6 (six) hours as needed. To be taken after surgery 12/25/22  Yes Cristie Hem, PA-C  pravastatin (PRAVACHOL) 20 MG tablet Take 1 tablet by mouth daily. 11/26/16  Yes [provider]  rosuvastatin (CRESTOR) 10 MG tablet Take 1 tablet (10 mg total) by mouth daily. 10/17/22 10/17/23 Yes Tysinger, Kermit Balo, PA-C  valsartan-hydrochlorothiazide (DIOVAN-HCT) 160-12.5 MG tablet Take 1 tablet by mouth once daily 01/15/23  Yes Tysinger, Kermit Balo, PA-C  Vitamin D, Ergocalciferol, (DRISDOL) 1.25 MG (50000 UNIT) CAPS capsule Take by mouth. 11/26/16  Yes [provider]  ondansetron (ZOFRAN) 4 MG tablet Take 1 tablet (4 mg total) by mouth every 8 (eight) hours as needed for nausea or vomiting. 12/16/22   Cristie Hem, PA-C    Current Outpatient Medications  Medication Sig Dispense Refill   amLODipine (NORVASC) 10 MG tablet Take 1 tablet (10 mg total) by mouth daily. 90 tablet 3   aspirin EC (EQ ASPIRIN ADULT LOW DOSE) 81 MG tablet Take 1 tablet (81 mg total) by mouth daily. Swallow whole. 90 tablet 3   Cyanocobalamin 2500 MCG TABS Take by mouth.     fenofibrate 54 MG tablet Take 1 tablet (54 mg total) by  mouth daily. 90 tablet 1   methocarbamol (ROBAXIN-750) 750 MG tablet Take 1 tablet (750 mg total) by mouth 2 (two) times daily as needed for muscle spasms. 20 tablet 0   oxyCODONE-acetaminophen (PERCOCET) 5-325 MG tablet Take 1 tablet by mouth every 6 (six) hours as needed. To be taken after surgery 30 tablet 0   pravastatin (PRAVACHOL) 20 MG tablet Take 1 tablet by mouth daily.     rosuvastatin (CRESTOR) 10 MG tablet Take 1 tablet (10 mg total) by mouth daily. 90 tablet 2   valsartan-hydrochlorothiazide (DIOVAN-HCT) 160-12.5 MG tablet Take 1 tablet by mouth once daily 90 tablet 1   Vitamin D, Ergocalciferol, (DRISDOL) 1.25 MG (50000 UNIT) CAPS capsule Take  by mouth.     ondansetron (ZOFRAN) 4 MG tablet Take 1 tablet (4 mg total) by mouth every 8 (eight) hours as needed for nausea or vomiting. 40 tablet 0   Current Facility-Administered Medications  Medication Dose Route Frequency Provider Last Rate Last Admin   0.9 %  sodium chloride infusion  500 mL Intravenous Once Napoleon Form, MD        Allergies as of 02/04/2023 - Review Complete 02/04/2023  Allergen Reaction Noted   Influenza vaccines  09/22/2017    Family History  Problem Relation Age of Onset   Diabetes Mother    Hypertension Mother    Kidney disease Mother    Anemia Mother    Hypertension Father    Diabetes Father    Heart disease Father    Hypertension Sister    Stroke Maternal Aunt    Cancer Neg Hx    Colon cancer Neg Hx    Colon polyps Neg Hx    Esophageal cancer Neg Hx    Rectal cancer Neg Hx    Stomach cancer Neg Hx     Social History   Socioeconomic History   Marital status: Married    Spouse name: Not on file   Number of children: Not on file   Years of education: Not on file   Highest education level: Some college, no degree  Occupational History   Not on file  Tobacco Use   Smoking status: Former    Current packs/day: 0.25    Average packs/day: 0.3 packs/day for 27.0 years (6.8 ttl pk-yrs)    Types: Cigarettes   Smokeless tobacco: Never  Vaping Use   Vaping status: Never Used  Substance and Sexual Activity   Alcohol use: Yes    Alcohol/week: 3.0 standard drinks of alcohol    Types: 1 Glasses of wine, 1 Cans of beer, 1 Shots of liquor per week    Comment: 4 weekly   Drug use: No   Sexual activity: Not on file  Other Topics Concern   Not on file  Social History Narrative   Married.  Works at [employer].   Exercises - walking;   [employer];   10/2022   Social Drivers of Health   Financial Resource Strain: Medium Risk (10/15/2022)   Overall Financial Resource Strain (CARDIA)    Difficulty of Paying Living Expenses: Somewhat  hard  Food Insecurity: Food Insecurity Present (10/15/2022)   Hunger Vital Sign    Worried About Running Out of Food in the Last Year: Sometimes true    Ran Out of Food in the Last Year: Never true  Transportation Needs: No Transportation Needs (10/15/2022)   PRAPARE - Administrator, Civil Service (Medical): No    Lack of Transportation (  Non-Medical): No  Physical Activity: Insufficiently Active (10/15/2022)   Exercise Vital Sign    Days of Exercise per Week: 1 day    Minutes of Exercise per Session: 20 min  Stress: Stress Concern Present (10/15/2022)   [employer] of Occupational Health - Occupational Stress Questionnaire    Feeling of Stress : To some extent  Social Connections: Unknown (10/15/2022)   Social Connection and Isolation Panel [NHANES]    Frequency of Communication with Friends and Family: Not on file    Frequency of Social Gatherings with Friends and Family: Twice a week    Attends Religious Services: More than 4 times per year    Active Member of [employer] or Organizations: No    Attends Engineer, structural: Not on file    Marital Status: Married  Intimate Partner Violence: Unknown (04/18/2021)   Received from [employer], [facility]   HITS    Physically Hurt: Not on file    Insult or Talk Down To: Not on file    Threaten Physical Harm: Not on file    Scream or Curse: Not on file    Review of Systems:  All other review of systems negative except as mentioned in the HPI.  Physical Exam: Vital signs in last 24 hours: BP 137/77   Pulse 74   Temp (!) 97.3 F (36.3 C)   Ht 5\' 2"  (1.575 m)   Wt 185 lb (83.9 kg)   SpO2 99%   BMI 33.84 kg/m  General:   Alert, NAD Lungs:  Clear .   Heart:  Regular rate and rhythm Abdomen:  Soft, nontender and nondistended. Neuro/Psych:  Alert and cooperative. Normal mood and affect. A and O x 3  Reviewed labs, radiology imaging, old records and pertinent past GI work up  Patient is appropriate for  planned procedure(s) and anesthesia in an ambulatory setting   K. Scherry Ran , MD [phone]

## 2023-02-04 NOTE — Patient Instructions (Signed)
Resume previous diet and medications. Repeat Colonoscopy in 5 years for surveillance purposes. Handouts provided on Diverticulosis and Hemorrhoids  YOU HAD AN ENDOSCOPIC PROCEDURE TODAY AT THE [facility]:   Refer to the procedure report that was given to you for any specific questions about what was found during the examination.  If the procedure report does not answer your questions, please call your gastroenterologist to clarify.  If you requested that your care partner not be given the details of your procedure findings, then the procedure report has been included in a sealed envelope for you to review at your convenience later.  YOU SHOULD EXPECT: Some feelings of bloating in the abdomen. Passage of more gas than usual.  Walking can help get rid of the air that was put into your GI tract during the procedure and reduce the bloating. If you had a lower endoscopy (such as a colonoscopy or flexible sigmoidoscopy) you may notice spotting of blood in your stool or on the toilet paper. If you underwent a bowel prep for your procedure, you may not have a normal bowel movement for a few days.  Please Note:  You might notice some irritation and congestion in your nose or some drainage.  This is from the oxygen used during your procedure.  There is no need for concern and it should clear up in a day or so.  SYMPTOMS TO REPORT IMMEDIATELY:  Following lower endoscopy (colonoscopy or flexible sigmoidoscopy):  Excessive amounts of blood in the stool  Significant tenderness or worsening of abdominal pains  Swelling of the abdomen that is new, acute  Fever of 100F or higher  For urgent or emergent issues, a gastroenterologist can be reached at any hour by calling [phone]. Do not use MyChart messaging for urgent concerns.    DIET:  We do recommend a small meal at first, but then you may proceed to your regular diet.  Drink plenty of fluids but you should avoid alcoholic beverages for 24  hours.  ACTIVITY:  You should plan to take it easy for the rest of today and you should NOT DRIVE or use heavy machinery until tomorrow (because of the sedation medicines used during the test).    FOLLOW UP: Our staff will call the number listed on your records the next business day following your procedure.  We will call around 7:15- 8:00 am to check on you and address any questions or concerns that you may have regarding the information given to you following your procedure. If we do not reach you, we will leave a message.     If any biopsies were taken you will be contacted by phone or by letter within the next 1-3 weeks.  Please call us at [phone] if you have not heard about the biopsies in 3 weeks.    SIGNATURES/CONFIDENTIALITY: You and/or your care partner have signed paperwork which will be entered into your electronic medical record.  These signatures attest to the fact that that the information above on your After Visit Summary has been reviewed and is understood.  Full responsibility of the confidentiality of this discharge information lies with you and/or your care-partner.

## 2023-02-05 ENCOUNTER — Encounter: Payer: BC Managed Care – PPO | Admitting: Rehabilitative and Restorative Service Providers"

## 2023-02-05 ENCOUNTER — Telehealth: Payer: Self-pay

## 2023-02-05 NOTE — Telephone Encounter (Signed)
  Follow up Call-     02/04/2023    9:58 AM  Call back number  Post procedure Call Back phone  # [phone]  Permission to leave phone message Yes    Post op call attempted, no answer, left VM.

## 2023-02-17 ENCOUNTER — Ambulatory Visit (INDEPENDENT_AMBULATORY_CARE_PROVIDER_SITE_OTHER): Payer: BC Managed Care – PPO | Admitting: Physical Therapy

## 2023-02-17 ENCOUNTER — Encounter: Payer: Self-pay | Admitting: Physical Therapy

## 2023-02-17 DIAGNOSIS — M6281 Muscle weakness (generalized): Secondary | ICD-10-CM

## 2023-02-17 DIAGNOSIS — M25511 Pain in right shoulder: Secondary | ICD-10-CM | POA: Diagnosis not present

## 2023-02-17 DIAGNOSIS — R600 Localized edema: Secondary | ICD-10-CM | POA: Diagnosis not present

## 2023-02-17 NOTE — Therapy (Signed)
 OUTPATIENT PHYSICAL THERAPY TREATMENT   Patient Name: Ariel Sweeney MRN: [id number] DOB:1965-07-09, 58 y.o., female Today's Date: 02/17/2023   END OF SESSION:  PT End of Session - 02/17/23 1202     Visit Number 8    Number of Visits 20    Date for PT Re-Evaluation 03/09/23    Authorization Type BCBS deductible met for 2024 - 30 visits    Authorization Time Period PN sent on 1/14 at 6th visit    Progress Note Due on Visit 16    PT Start Time 1145    PT Stop Time 1223    PT Time Calculation (min) 38 min    Activity Tolerance Patient tolerated treatment well    Behavior During Therapy [facility] for tasks assessed/performed                   Past Medical History:  Diagnosis Date   Decreased pulse    normal ABIs 06/2016   Diabetes mellitus without complication (HCC)    Full dentures 08/2018   GERD (gastroesophageal reflux disease)    intermittent   Goiter    Hyperlipidemia    Hypertension    Obesity    Tobacco use    Vitamin D  deficiency    Wears contact lenses    Past Surgical History:  Procedure Laterality Date   BREAST CYST ASPIRATION Right 2017   cyst    CHOLECYSTECTOMY     COLONOSCOPY  09/2015   tubular adenoma, repeat 5 years;  Dr. Gustav Mcgee   COLPOSCOPY     history of abnormal pap in remote past   ROTATOR CUFF REPAIR Right    12/24   STRABISMUS SURGERY     childhood   Patient Active Problem List   Diagnosis Date Noted   Diabetes mellitus type 2 with complications (HCC) 02/05/2022   Screening for lung cancer 06/13/2021   Need for pneumococcal vaccination 05/30/2020   Colon cancer screening 05/30/2020   Hyperlipidemia 09/21/2019   Nasal drainage 09/08/2019   Snoring 09/08/2019   Fatigue 09/08/2019   Sleep disturbance 09/08/2019   Ill-fitting dentures 09/08/2019   Vaccine counseling 09/22/2017   Influenza vaccination declined 09/22/2017   Right arm pain 02/26/2017   Vitamin D  deficiency 07/09/2016   Screening for cervical cancer  07/09/2016   Pain in both thighs 06/23/2016   Decreased pedal pulses 06/23/2016   Encounter for health maintenance examination in adult 06/06/2015   Tobacco use disorder 06/06/2015   Screening for breast cancer 06/06/2015   Routine general medical examination at a health care facility 06/06/2015   Right shoulder pain 06/06/2015   Essential hypertension 04/17/2014   Goiter 04/17/2014   Obesity 04/17/2014   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  Back to work with minimal lifting.  Now without the sling.   PERTINENT HISTORY: HTN, obesity, vit D deficiency, hyperlipidemia, breast cyst aspiration, strabismus surgery  PAIN:  NPRS scale: 2-3/10 Pain location: Rt shoulder Pain description: achy, throbbing Aggravating factors: movement, sleeping, certain positions Relieving factors: meds, changing positions  PRECAUTIONS: Shoulder  WEIGHT BEARING RESTRICTIONS: No  FALLS:  Has patient fallen in last 6 months? No  LIVING ENVIRONMENT: Lives with: lives with their family and lives with their spouse Lives in: House/apartment Stairs: Yes: External: 3 steps; none Has following equipment at home: None  OCCUPATION: Urgent care reception  PLOF: Independent  PATIENT GOALS:stop hurting, be able to cook and complete house hold chores  Next MD visit:    OBJECTIVE:   DIAGNOSTIC FINDINGS:  12/29/2022 review of chart:   1. Full-thickness tear of the anterior 13 mm of the supraspinatus tendon footprint. Mild anterior supraspinatus muscle atrophy. 2. Mild tendinosis of the distal critical zone and footprint of the infraspinatus. 3. Very mild superior subscapularis tendinosis. 4. Mild to moderate degenerative changes of the acromioclavicular joint.  PATIENT SURVEYS:  12/29/22: FOTO intake:  28%  01/27/23: FOTO update: 42%  COGNITION: 12/29/2022 Overall cognitive status: WFL     SENSATION: 12/29/2022 WFL  POSTURE: 12/29/2022 Rounded shoulders and forward head  UPPER EXTREMITY ROM:   ROM Right 12/29/22 Left 12/29/22 Right 01/08/2023 PROM in supine - limited by pain Rt 01/13/23 PROM supine Rt 01/20/23 PROM supine Rt 01/27/23 PROM supine Rt 02/17/23 AROM supine  Shoulder flexion 100 158 128 142 145 152 140  Shoulder extension         Shoulder abduction   100 128 120 130 150  Shoulder adduction         Shoulder internal rotation 60 76 75 75 75 75 Shoulder abd 45 deg 70 Shoulder abd 45 deg  Shoulder external rotation 30 70 60 64 c pain  64 60 shoulder abd 45 deg 62 shoulder abd 45 deg  Elbow flexion 145 146       Elbow extension 0 0       Wrist flexion         Wrist extension         Wrist ulnar deviation         Wrist radial deviation         Wrist pronation         Wrist supination         (Blank rows = not tested)  UPPER EXTREMITY MMT:  MMT Right 12/29/2022 Left 12/29/2022 Right 02/03/2023  Shoulder flexion  4 2+/5  Shoulder extension  4   Shoulder abduction  4   Shoulder adduction     Shoulder internal rotation  4   Shoulder external rotation  4   Middle trapezius     Lower trapezius     Elbow flexion     Elbow extension     Wrist flexion     Wrist extension     Wrist ulnar deviation     Wrist radial deviation     Wrist pronation     Wrist supination     Grip strength (lbs)     (Blank  rows = not tested)  TODAY'S TREATMENT:                                                                                 DATE: 02/17/2023:  Manual Supine Rt shoulder passive range Grade 2-3  inferior joint mobs Rt GH joint and posterior glide   Therex: Pulleys: x 3 minutes flexion and 2 minutes abduction UBE: level 2.5 x 3 minutes each direction Reactive isometrics ER using red TB 2 x 10 walk outs for Rt shoulder Rows: green TB 2 x 10 holding 3 sec Supine shoulder at 90 deg circles c 1 # 60 sec each direciton Supine shoulder flexion c 1# 2 x 10   TherAct: Standing lifting 2# x 10 onto 1st and 2nd shelf in clinic gym Dead lift carry: 5 # 200 feet  c Rt UE Instructed in how to perform IR using a towel for ADL's and bathing      TODAY'S TREATMENT:                                                                                 DATE: 02/03/2023:  Manual Supine Rt shoulder passive range Grade 2-3  inferior joint mobs Rt GH joint and posterior glide   Therex: UBE UE only fwd/back each way with 1 min rest between lvl 2.0 Supine active Rt shoulder flexion to fatigue x15 in 90 seconds, x 23 in 2:53 seconds (cues for home use)   Supine wand 1 lb AAROM flexion x 15 (cues for home) Supine 90 deg flexion small circles cw, ccw 30 x each way Standing green band bilateral rows 2 x 15 Standing Rt arm ER isometric 5 sec on/off x 12 with towel under arm  Standing Rt arm extended at side extension isometric lower trap 5 sec on/off x 12   TODAY'S TREATMENT:                                                                                 DATE: 01/26/2022:  Manual Supine Rt shoulder passive range Grade 2-3  inferior joint mobs RT GH joint and posterior glide   Therex: Standing pendulum flexion, abduction,  circles both directions x 2 minutes Standing Rt shoulder flexion on table passive range x 15 using a green therapy ball on table top c 3 sec hold Standing Rt shoulder scaption on green therapy ball passive range x 15 c 3 sec hold Seated Rt shoulder scaption on table passive range x 10 c 3 sec hold Seated passive Rt shoulder flexion in cradle position x 10  Seated 2# ball  squeezes x 10 holding 5 sec    PATIENT EDUCATION: 02/03/2023  Education details: HEP update Person educated: Patient Education method: Explanation, Demonstration, Verbal cues, and Handouts Education comprehension: verbalized understanding, returned demonstration, and verbal cues required  HOME EXERCISE PROGRAM: Access Code: [email or web address] URL: [email or web address] Date: 02/03/2023 Prepared by: Ozell Silvan  Exercises - Supine Shoulder Flexion Extension AAROM with Dowel  - 2-3 x daily - 7 x weekly - 1-2 sets - 10-15 reps - 3 hold - Supine Shoulder Flexion Extension Full Range AROM  - 1-2 x daily - 7 x weekly - 1-2 sets - 15-50 reps - Standing Bilateral Low Shoulder Row with Anchored Resistance  - 1-2 x daily - 7 x weekly - 2-3 sets - 10-15 reps - Standing Isometric Shoulder Extension at Table  - 2-3 x daily - 7 x weekly - 1 sets - 10-15 reps - 5-10 hold - Standing Isometric Shoulder External Rotation with Doorway (Mirrored)  - 2-3 x daily - 7 x weekly - 1 sets - 10-15 reps - 5-10 hold  ASSESSMENT:  CLINICAL IMPRESSION: Pt reporting compliance to updated HEP. Pt tolerating exercises well with minimal  pain noted at end ranges. Pt continues to make progress now with AROM. Recommending continued skilled PT services warranted at this time.   OBJECTIVE IMPAIRMENTS: decreased mobility, decreased ROM, decreased strength, increased edema, impaired UE functional use, and pain.   ACTIVITY LIMITATIONS: lifting, sleeping, bed mobility, bathing, toileting, dressing, self feeding, reach over head, and  hygiene/grooming  PARTICIPATION LIMITATIONS: meal prep, cleaning, laundry, driving, community activity, and occupation  PERSONAL FACTORS: 3+ comorbidities: see pertinent history above  are also affecting patient's functional outcome.   REHAB POTENTIAL: Good  CLINICAL DECISION MAKING: Stable/uncomplicated  EVALUATION COMPLEXITY: Low   GOALS: Goals reviewed with patient? Yes  SHORT TERM GOALS: (target date for Short term goals are 3 weeks 01/19/2023)  1.Patient will demonstrate independent use of home exercise program to maintain progress from in clinic treatments. Goal status: MET 01/13/23  LONG TERM GOALS: (target dates for all long term goals are 10 weeks  03/09/2023 )   1. Patient will demonstrate/report pain at worst less than or equal to 2/10 to facilitate minimal limitation in daily activity secondary to pain symptoms. Goal status: on-going 01/27/23   2. Patient will demonstrate independent use of home exercise program to facilitate ability to maintain/progress functional gains from skilled physical therapy services. Goal status: on going 02/03/2023   3. Patient will demonstrate FOTO outcome > or = 60 % to indicate reduced disability due to condition. Goal status:  on going 02/03/2023   4.  Patient will demonstrate Rt UE MMT >/= 4/5 throughout to facilitate lifting, reaching, carrying at [facility] in daily activity.   Goal status: on going 02/03/2023   5.  Patient will demonstrate Rt shoulder flexion to >/= 140 degrees to facilitate ADL's and functional mobility.    Goal status: on going 02/03/2023   6.  Pt will improve her Rt shoulder ER to >/= 60 degrees to improve her ADL's and functional mobility.  Goal status: Met    7.  pt will be able to lift 5 # from counter to over head shelf with her Rt UE with pain </= 2/10.  Goal Status: on going 02/03/2023  PLAN:  PT FREQUENCY: 1-2x/week  PT DURATION: 10 weeks  PLANNED INTERVENTIONS: Can include 02853- PT Re-evaluation,  97110-Therapeutic exercises, 97530- Therapeutic activity, 97112- Neuromuscular re-education, 97535- Self Care, 97140- Manual therapy, [id number]- Gait training, [phone]- Orthotic  Fit/training, 04007- Canalith repositioning, 02886- Aquatic Therapy, [id number]- Electrical stimulation (unattended), [id number]- Electrical stimulation (manual), [id number]- Vasopneumatic device, [id number]- Ultrasound, [id number]- Traction (mechanical), [id number]- Ionotophoresis 4mg /ml Dexamethasone, Patient/Family education, Balance training, Stair training, Taping, Dry Needling, Joint mobilization, Joint manipulation, Spinal manipulation, Spinal mobilization, Scar mobilization, Vestibular training, Visual/preceptual remediation/compensation, DME instructions, Cryotherapy, and Moist heat.  All performed as medically necessary.  All included unless contraindicated  PLAN FOR NEXT SESSION:   Active range progression.   Lifting to tolerance per PA visit note on 01/27/2023.   Delon Lunger, PT, MPT 02/17/23 12:03 PM   02/17/23  12:03 PM

## 2023-02-23 ENCOUNTER — Encounter: Payer: Self-pay | Admitting: Rehabilitative and Restorative Service Providers"

## 2023-02-23 ENCOUNTER — Ambulatory Visit (INDEPENDENT_AMBULATORY_CARE_PROVIDER_SITE_OTHER): Payer: BC Managed Care – PPO | Admitting: Rehabilitative and Restorative Service Providers"

## 2023-02-23 DIAGNOSIS — R600 Localized edema: Secondary | ICD-10-CM | POA: Diagnosis not present

## 2023-02-23 DIAGNOSIS — M25511 Pain in right shoulder: Secondary | ICD-10-CM | POA: Diagnosis not present

## 2023-02-23 DIAGNOSIS — M6281 Muscle weakness (generalized): Secondary | ICD-10-CM

## 2023-02-23 NOTE — Therapy (Signed)
 OUTPATIENT PHYSICAL THERAPY TREATMENT   Patient Name: Ariel Sweeney MRN: [id number] DOB:14-Feb-1965, 58 y.o., female Today's Date: 02/23/2023   END OF SESSION:  PT End of Session - 02/23/23 1047     Visit Number 9    Number of Visits 20    Date for PT Re-Evaluation 03/09/23    Authorization Type [employer] deductible met for 2024 - 30 visits    Authorization Time Period PN sent on 1/14 at 6th visit    Progress Note Due on Visit 16    PT Start Time 1047    PT Stop Time 1141    PT Time Calculation (min) 54 min    Activity Tolerance Patient tolerated treatment well    Behavior During Therapy [facility] for tasks assessed/performed                    Past Medical History:  Diagnosis Date   Decreased pulse    normal ABIs 06/2016   Diabetes mellitus without complication (HCC)    Full dentures 08/2018   GERD (gastroesophageal reflux disease)    intermittent   Goiter    Hyperlipidemia    Hypertension    Obesity    Tobacco use    Vitamin D  deficiency    Wears contact lenses    Past Surgical History:  Procedure Laterality Date   BREAST CYST ASPIRATION Right 2017   cyst    CHOLECYSTECTOMY     COLONOSCOPY  09/2015   tubular adenoma, repeat 5 years;  Dr. Darleen Ee   COLPOSCOPY     history of abnormal pap in remote past   ROTATOR CUFF REPAIR Right    12/24   STRABISMUS SURGERY     childhood   Patient Active Problem List   Diagnosis Date Noted   Diabetes mellitus type 2 with complications (HCC) 02/05/2022   Screening for lung cancer 06/13/2021   Need for pneumococcal vaccination 05/30/2020   Colon cancer screening 05/30/2020   Hyperlipidemia 09/21/2019   Nasal drainage 09/08/2019   Snoring 09/08/2019   Fatigue 09/08/2019   Sleep disturbance 09/08/2019   Ill-fitting dentures 09/08/2019   Vaccine counseling 09/22/2017   Influenza vaccination declined 09/22/2017   Right arm pain 02/26/2017   Vitamin D  deficiency 07/09/2016   Screening for cervical cancer  07/09/2016   Pain in both thighs 06/23/2016   Decreased pedal pulses 06/23/2016   Encounter for health maintenance examination in adult 06/06/2015   Tobacco use disorder 06/06/2015   Screening for breast cancer 06/06/2015   Routine general medical examination at a health care facility 06/06/2015   Right shoulder pain 06/06/2015   Essential hypertension 04/17/2014   Goiter 04/17/2014   Obesity 04/17/2014   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  Pt indicated reaching above head can still be limited.   PERTINENT HISTORY: HTN, obesity, vit D deficiency, hyperlipidemia, breast cyst aspiration, strabismus surgery  PAIN:  NPRS scale: last 2/10 Pain location: Rt shoulder Pain description: achy, throbbing Aggravating factors: movement, sleeping, certain positions Relieving factors: meds, changing positions  PRECAUTIONS: Shoulder  WEIGHT BEARING RESTRICTIONS: No  FALLS:  Has patient fallen in last 6 months? No  LIVING ENVIRONMENT: Lives with: lives with their family and lives with their spouse Lives in: House/apartment Stairs: Yes: External: 3 steps; none Has following equipment at home: None  OCCUPATION: Urgent care reception  PLOF: Independent  PATIENT GOALS:stop hurting, be able to cook and complete house  hold chores  Next MD visit:   OBJECTIVE:   DIAGNOSTIC FINDINGS:  12/29/2022 review of chart:   1. Full-thickness tear of the anterior 13 mm of the supraspinatus tendon footprint. Mild anterior supraspinatus muscle atrophy. 2. Mild tendinosis of the distal critical zone and footprint of the infraspinatus. 3. Very mild superior subscapularis tendinosis. 4. Mild to moderate degenerative changes of the acromioclavicular joint.  PATIENT SURVEYS:  01/27/23: FOTO update: 42%  12/29/22: FOTO intake:  28%    COGNITION: 12/29/2022 Overall cognitive status: WFL     SENSATION: 12/29/2022 WFL  POSTURE: 12/29/2022 Rounded shoulders and forward head  UPPER EXTREMITY ROM:   ROM Right 12/29/22 Left 12/29/22 Right 01/08/2023 PROM in supine - limited by pain Rt 01/13/23 PROM supine Rt 01/20/23 PROM supine Rt 01/27/23 PROM supine Rt 02/17/23 AROM supine  Shoulder flexion 100 158 128 142 145 152 140  Shoulder extension         Shoulder abduction   100 128 120 130 150  Shoulder adduction         Shoulder internal rotation 60 76 75 75 75 75 Shoulder abd 45 deg 70 Shoulder abd 45 deg  Shoulder external rotation 30 70 60 64 c pain  64 60 shoulder abd 45 deg 62 shoulder abd 45 deg  Elbow flexion 145 146       Elbow extension 0 0       Wrist flexion         Wrist extension         Wrist ulnar deviation         Wrist radial deviation         Wrist pronation         Wrist supination         (Blank rows = not tested)  UPPER EXTREMITY MMT:  MMT Right 12/29/2022 Left 12/29/2022 Right 02/03/2023 Right 02/23/2023  Shoulder flexion  4 2+/5 4/5  Shoulder extension  4    Shoulder abduction  4  4/5  Shoulder adduction      Shoulder internal rotation  4  5/5  Shoulder external rotation  4  4/5  Middle trapezius      Lower trapezius      Elbow flexion      Elbow extension      Wrist flexion      Wrist extension      Wrist ulnar deviation      Wrist radial deviation       Wrist pronation      Wrist supination      Grip strength (lbs)      (Blank rows = not tested)  TODAY'S TREATMENT:                                                                                 DATE: 02/23/2023:  Therex UBE fwd/back lvl 3.0 4 mins each way with 1 min rest between directions.  Pulley flexion and scaption 3 mins each way for ROM and eccentric lowering strength  Manual Supine Rt shoulder g3 inferior glides in flexion, scaption and abduction.  Mobilization c movement posterior gh glide with passive ER to tolerance  Neuro Re-ed (improve proprioception, neuro muscular control) Supine stabilizations with variable resistance by clinician in 45 deg abduction for ER/IR 30 sec bouts Supine 90 deg flexion stabilizations c variable resistance by clinician 30 sec bouts Supine 90 deg small circles Rt shoulder with arm outstretched 2 lb 30 x 2 cw, ccw each Standing reactive ER walk outs with towel under arm 5 sec hold x 10 green band  Tband rows green 2 x 15 bilateral Tband reactive ER walk out Rt shoulder c towel under arm 5 sec hold x 10     TODAY'S TREATMENT:                                                                                 DATE: 02/17/2023:  Manual Supine Rt shoulder passive range Grade 2-3  inferior joint mobs Rt GH joint and posterior glide   Therex: Pulleys: x 3 minutes flexion and 2 minutes abduction UBE: level 2.5 x 3 minutes each direction Reactive isometrics ER using red TB 2 x 10 walk outs for Rt shoulder Rows: green TB 2 x 10 holding 3 sec Supine shoulder at 90 deg circles c 1 # 60 sec each direciton Supine shoulder flexion c 1# 2 x 10   TherAct: Standing lifting 2# x 10 onto 1st and 2nd shelf in clinic gym Dead lift carry: 5 # 200 feet  c Rt UE Instructed in  how to perform IR using a towel for ADL's and bathing   TODAY'S TREATMENT:                                                                                 DATE: 02/03/2023:  Manual Supine Rt shoulder passive range Grade 2-3  inferior joint mobs Rt GH joint and posterior glide   Therex: UBE UE only fwd/back each way with 1 min rest between lvl 2.0 Supine active Rt shoulder flexion to fatigue x15 in 90 seconds, x 23 in 2:53 seconds (cues for home use)   Supine wand 1 lb AAROM flexion x 15 (cues  for home) Supine 90 deg flexion small circles cw, ccw 30 x each way Standing green band bilateral rows 2 x 15 Standing Rt arm ER isometric 5 sec on/off x 12 with towel under arm  Standing Rt arm extended at side extension isometric lower trap 5 sec on/off x 12   TODAY'S TREATMENT:                                                                                 DATE: 01/26/2022:  Manual Supine Rt shoulder passive range Grade 2-3  inferior joint mobs RT GH joint and posterior glide   Therex: Standing pendulum flexion, abduction, circles both directions x 2 minutes Standing Rt shoulder flexion on table passive range x 15 using a green therapy ball on table top c 3 sec hold Standing Rt shoulder scaption on green therapy ball passive range x 15 c 3 sec hold Seated Rt shoulder scaption on table passive range x 10 c 3 sec hold Seated passive Rt shoulder flexion in cradle position x 10  Seated 2# ball squeezes x 10 holding 5 sec    PATIENT EDUCATION: 02/03/2023  Education details: HEP update Person educated: Patient Education method: Programmer, multimedia, Demonstration, Verbal cues, and Handouts Education comprehension: verbalized understanding, returned demonstration, and verbal cues required  HOME EXERCISE PROGRAM: Access Code: [email or web address] URL: [email or web address] Date: 02/03/2023 Prepared by: Bonna Bustard  Exercises - Supine Shoulder Flexion Extension AAROM with Dowel  - 2-3 x  daily - 7 x weekly - 1-2 sets - 10-15 reps - 3 hold - Supine Shoulder Flexion Extension Full Range AROM  - 1-2 x daily - 7 x weekly - 1-2 sets - 15-50 reps - Standing Bilateral Low Shoulder Row with Anchored Resistance  - 1-2 x daily - 7 x weekly - 2-3 sets - 10-15 reps - Standing Isometric Shoulder Extension at Table  - 2-3 x daily - 7 x weekly - 1 sets - 10-15 reps - 5-10 hold - Standing Isometric Shoulder External Rotation with Doorway (Mirrored)  - 2-3 x daily - 7 x weekly - 1 sets - 10-15 reps - 5-10 hold  ASSESSMENT:  CLINICAL IMPRESSION: Range of motion continued to show progression with primary restriction being observed as discomfort/guarding vs. Capsular tightness.  Continued inclusion of neuromuscular recruitment to improve muscle activation/positioning awareness to improve daily activity.  Continued skilled PT services indicated at this time.   OBJECTIVE IMPAIRMENTS: decreased mobility, decreased ROM, decreased strength, increased edema, impaired UE functional use, and pain.   ACTIVITY LIMITATIONS: lifting, sleeping, bed mobility, bathing, toileting, dressing, self feeding, reach over head, and hygiene/grooming  PARTICIPATION LIMITATIONS: meal prep, cleaning, laundry, driving, community activity, and occupation  PERSONAL FACTORS: 3+ comorbidities: see pertinent history above  are also affecting patient's functional outcome.   REHAB POTENTIAL: Good  CLINICAL DECISION MAKING: Stable/uncomplicated  EVALUATION COMPLEXITY: Low   GOALS: Goals reviewed with patient? Yes  SHORT TERM GOALS: (target date for Short term goals are 3 weeks 01/19/2023)  1.Patient will demonstrate independent use of home exercise program to maintain progress from in clinic treatments. Goal status: MET 01/13/23  LONG TERM GOALS: (target dates for all long term goals are 10 weeks  03/09/2023 )  1. Patient will demonstrate/report pain at worst less than or equal to 2/10 to facilitate minimal limitation in  daily activity secondary to pain symptoms. Goal status: on-going 01/27/23   2. Patient will demonstrate independent use of home exercise program to facilitate ability to maintain/progress functional gains from skilled physical therapy services. Goal status: on going 02/03/2023   3. Patient will demonstrate FOTO outcome > or = 60 % to indicate reduced disability due to condition. Goal status:  on going 02/03/2023   4.  Patient will demonstrate Rt UE MMT >/= 4/5 throughout to facilitate lifting, reaching, carrying at [facility] in daily activity.   Goal status: on going 02/03/2023   5.  Patient will demonstrate Rt shoulder flexion to >/= 140 degrees to facilitate ADL's and functional mobility.    Goal status: on going 02/03/2023   6.  Pt will improve her Rt shoulder ER to >/= 60 degrees to improve her ADL's and functional mobility.  Goal status: Met    7.  pt will be able to lift 5 # from counter to over head shelf with her Rt UE with pain </= 2/10.  Goal Status: on going 02/03/2023  PLAN:  PT FREQUENCY: 1-2x/week  PT DURATION: 10 weeks  PLANNED INTERVENTIONS: Can include 04540- PT Re-evaluation, 97110-Therapeutic exercises, 97530- Therapeutic activity, 97112- Neuromuscular re-education, 97535- Self Care, 97140- Manual therapy, [id number]- Gait training, [phone]- Orthotic Fit/training, [phone]- Canalith repositioning, [id number]- Aquatic Therapy, 97014- Electrical stimulation (unattended), [id number]- Electrical stimulation (manual), [id number]- Vasopneumatic device, [id number]- Ultrasound, [id number]- Traction (mechanical), [id number]- Ionotophoresis 4mg /ml Dexamethasone, Patient/Family education, Balance training, Stair training, Taping, Dry Needling, Joint mobilization, Joint manipulation, Spinal manipulation, Spinal mobilization, Scar mobilization, Vestibular training, Visual/preceptual remediation/compensation, DME instructions, Cryotherapy, and Moist heat.  All performed as medically necessary.  All included unless  contraindicated  PLAN FOR NEXT SESSION: Continue progression in neuromuscular control and active movement gains.   Lifting to tolerance per PA visit note on 01/27/2023.   Bonna Bustard, PT, DPT, OCS, ATC 02/23/23  11:42 AM

## 2023-02-25 NOTE — Progress Notes (Deleted)
 Post-Op Visit Note   Patient: Ariel Sweeney           Date of Birth: Jul 26, 1965           MRN: [id number] Visit Date: 02/27/2023 PCP: Jac Canavan, PA-C   Assessment & Plan:  Chief Complaint: No chief complaint on file.  Visit Diagnoses:  1. S/P right rotator cuff repair   2. Nontraumatic complete tear of right rotator cuff     Plan: ***  Follow-Up Instructions: No follow-ups on file.   Orders:  No orders of the defined types were placed in this encounter.  No orders of the defined types were placed in this encounter.   Imaging: No results found.  PMFS History: Patient Active Problem List   Diagnosis Date Noted   Diabetes mellitus type 2 with complications (HCC) 02/05/2022   Screening for lung cancer 06/13/2021   Need for pneumococcal vaccination 05/30/2020   Colon cancer screening 05/30/2020   Hyperlipidemia 09/21/2019   Nasal drainage 09/08/2019   Snoring 09/08/2019   Fatigue 09/08/2019   Sleep disturbance 09/08/2019   Ill-fitting dentures 09/08/2019   Vaccine counseling 09/22/2017   Influenza vaccination declined 09/22/2017   Right arm pain 02/26/2017   Vitamin D deficiency 07/09/2016   Screening for cervical cancer 07/09/2016   Pain in both thighs 06/23/2016   Decreased pedal pulses 06/23/2016   Encounter for health maintenance examination in adult 06/06/2015   Tobacco use disorder 06/06/2015   Screening for breast cancer 06/06/2015   Routine general medical examination at a health care facility 06/06/2015   Right shoulder pain 06/06/2015   Essential hypertension 04/17/2014   Goiter 04/17/2014   Obesity 04/17/2014   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   Past Medical History:  Diagnosis Date   Decreased pulse    normal ABIs 06/2016   Diabetes mellitus without complication (HCC)    Full dentures 08/2018   GERD (gastroesophageal reflux disease)    intermittent   Goiter    Hyperlipidemia    Hypertension    Obesity    Tobacco use    Vitamin D  deficiency    Wears contact lenses     Family History  Problem Relation Age of Onset   Diabetes Mother    Hypertension Mother    Kidney disease Mother    Anemia Mother    Hypertension Father    Diabetes Father    Heart disease Father    Hypertension Sister    Stroke Maternal Aunt    Cancer Neg Hx    Colon cancer Neg Hx    Colon polyps Neg Hx    Esophageal cancer Neg Hx    Rectal cancer Neg Hx    Stomach cancer Neg Hx     Past Surgical History:  Procedure Laterality Date   BREAST CYST ASPIRATION Right 2017   cyst    CHOLECYSTECTOMY     COLONOSCOPY  09/2015   tubular adenoma, repeat 5 years;  Dr. Marsa Aris   COLPOSCOPY     history of abnormal pap in remote past   ROTATOR CUFF REPAIR Right    12/24   STRABISMUS SURGERY     childhood   Social History   Occupational History   Not on file  Tobacco Use   Smoking status: Former    Current packs/day: 0.25    Average packs/day: 0.3 packs/day for 27.0 years (6.8 ttl pk-yrs)    Types: Cigarettes   Smokeless tobacco: Never  Vaping Use   Vaping status:  Never Used  Substance and Sexual Activity   Alcohol use: Yes    Alcohol/week: 3.0 standard drinks of alcohol    Types: 1 Glasses of wine, 1 Cans of beer, 1 Shots of liquor per week    Comment: 4 weekly   Drug use: No   Sexual activity: Not on file

## 2023-02-27 ENCOUNTER — Ambulatory Visit: Payer: BC Managed Care – PPO | Admitting: Orthopaedic Surgery

## 2023-02-27 DIAGNOSIS — Z9889 Other specified postprocedural states: Secondary | ICD-10-CM

## 2023-02-27 DIAGNOSIS — M75121 Complete rotator cuff tear or rupture of right shoulder, not specified as traumatic: Secondary | ICD-10-CM

## 2023-03-03 ENCOUNTER — Ambulatory Visit (INDEPENDENT_AMBULATORY_CARE_PROVIDER_SITE_OTHER): Payer: BC Managed Care – PPO | Admitting: Rehabilitative and Restorative Service Providers"

## 2023-03-03 ENCOUNTER — Encounter: Payer: Self-pay | Admitting: Rehabilitative and Restorative Service Providers"

## 2023-03-03 DIAGNOSIS — M25511 Pain in right shoulder: Secondary | ICD-10-CM

## 2023-03-03 DIAGNOSIS — R600 Localized edema: Secondary | ICD-10-CM

## 2023-03-03 DIAGNOSIS — M6281 Muscle weakness (generalized): Secondary | ICD-10-CM | POA: Diagnosis not present

## 2023-03-03 NOTE — Therapy (Signed)
OUTPATIENT PHYSICAL THERAPY TREATMENT   Patient Name: Ariel Sweeney MRN: [id number] DOB:1965/01/16, 58 y.o., female Today's Date: 03/03/2023   END OF SESSION:  PT End of Session - 03/03/23 1044     Visit Number 10    Number of Visits 20    Date for PT Re-Evaluation 03/09/23    Authorization Type [employer] deductible met for 2024 - 30 visits    Authorization Time Period PN sent on 1/14 at 6th visit    Progress Note Due on Visit 16    PT Start Time 1058    PT Stop Time 1136    PT Time Calculation (min) 38 min    Activity Tolerance Patient tolerated treatment well    Behavior During Therapy [facility] for tasks assessed/performed              Past Medical History:  Diagnosis Date   Decreased pulse    normal ABIs 06/2016   Diabetes mellitus without complication (HCC)    Full dentures 08/2018   GERD (gastroesophageal reflux disease)    intermittent   Goiter    Hyperlipidemia    Hypertension    Obesity    Tobacco use    Vitamin D deficiency    Wears contact lenses    Past Surgical History:  Procedure Laterality Date   BREAST CYST ASPIRATION Right 2017   cyst    CHOLECYSTECTOMY     COLONOSCOPY  09/2015   tubular adenoma, repeat 5 years;  Dr. Marsa Aris   COLPOSCOPY     history of abnormal pap in remote past   ROTATOR CUFF REPAIR Right    12/24   STRABISMUS SURGERY     childhood   Patient Active Problem List   Diagnosis Date Noted   Diabetes mellitus type 2 with complications (HCC) 02/05/2022   Screening for lung cancer 06/13/2021   Need for pneumococcal vaccination 05/30/2020   Colon cancer screening 05/30/2020   Hyperlipidemia 09/21/2019   Nasal drainage 09/08/2019   Snoring 09/08/2019   Fatigue 09/08/2019   Sleep disturbance 09/08/2019   Ill-fitting dentures 09/08/2019   Vaccine counseling 09/22/2017   Influenza vaccination declined 09/22/2017   Right arm pain 02/26/2017   Vitamin D deficiency 07/09/2016   Screening for cervical cancer 07/09/2016    Pain in both thighs 06/23/2016   Decreased pedal pulses 06/23/2016   Encounter for health maintenance examination in adult 06/06/2015   Tobacco use disorder 06/06/2015   Screening for breast cancer 06/06/2015   Routine general medical examination at a health care facility 06/06/2015   Right shoulder pain 06/06/2015   Essential hypertension 04/17/2014   Goiter 04/17/2014   Obesity 04/17/2014   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  Reported being able to reach a little better now.   PERTINENT HISTORY: HTN, obesity, vit D deficiency, hyperlipidemia, breast cyst aspiration, strabismus surgery  PAIN:  NPRS scale: at worst 3/10 in last few days Pain location: Rt shoulder Pain description: achy, throbbing Aggravating factors: movement, sleeping, certain positions Relieving factors: meds, changing positions  PRECAUTIONS: Shoulder  WEIGHT BEARING RESTRICTIONS: No  FALLS:  Has patient fallen in last 6 months? No  LIVING ENVIRONMENT: Lives with: lives with their family and lives with their spouse Lives in: House/apartment Stairs: Yes: External: 3 steps; none Has following equipment at home: None  OCCUPATION: Urgent care reception  PLOF: Independent  PATIENT GOALS:stop hurting, be able to cook and complete house hold chores  Next MD visit:    OBJECTIVE:   DIAGNOSTIC FINDINGS:  12/29/2022 review of chart:   1. Full-thickness tear of the anterior 13 mm of the supraspinatus tendon footprint. Mild anterior supraspinatus muscle atrophy. 2. Mild tendinosis of the distal critical zone and footprint of the infraspinatus. 3. Very mild superior subscapularis tendinosis. 4. Mild to moderate degenerative changes of the acromioclavicular joint.  PATIENT SURVEYS:  03/03/2023: FOTO update: 58 %   01/27/23: FOTO update: 42%  12/29/22: FOTO intake:  28%    COGNITION: 12/29/2022 Overall cognitive status: WFL     SENSATION: 12/29/2022 WFL  POSTURE: 12/29/2022 Rounded shoulders and forward head  UPPER EXTREMITY ROM:   ROM Right 12/29/22 Left 12/29/22 Right 01/08/2023 PROM in supine - limited by pain Rt 01/13/23 PROM supine Rt 01/20/23 PROM supine Rt 01/27/23 PROM supine Rt 02/17/23 AROM supine  Shoulder flexion 100 158 128 142 145 152 140  Shoulder extension         Shoulder abduction   100 128 120 130 150  Shoulder adduction         Shoulder internal rotation 60 76 75 75 75 75 Shoulder abd 45 deg 70 Shoulder abd 45 deg  Shoulder external rotation 30 70 60 64 c pain  64 60 shoulder abd 45 deg 62 shoulder abd 45 deg  Elbow flexion 145 146       Elbow extension 0 0       Wrist flexion         Wrist extension         Wrist ulnar deviation         Wrist radial deviation         Wrist pronation         Wrist supination         (Blank rows = not tested)  UPPER EXTREMITY MMT:  MMT Right 12/29/2022 Left 12/29/2022 Right 02/03/2023 Right 02/23/2023  Shoulder flexion  4 2+/5 4/5  Shoulder extension  4    Shoulder abduction  4  4/5  Shoulder adduction      Shoulder internal rotation  4  5/5  Shoulder external rotation  4  4/5  Middle trapezius      Lower trapezius      Elbow flexion      Elbow extension      Wrist flexion      Wrist extension      Wrist ulnar deviation      Wrist radial deviation       Wrist pronation      Wrist supination      Grip strength (lbs)      (Blank rows = not tested)  TODAY'S TREATMENT:                                                                                 DATE: 2 /18/2025:  Therex UBE fwd/back lvl 3.0 4 mins each way with 1 min rest between directions.  Pulley flexion and scaption 2 mins each way for ROM and eccentric lowering strength  Neuro Re-ed (improve proprioception, neuro muscular control) Standing gh ext with higher band attachment blue band 2 x 15  Standing reactive ER walk outs with towel under arm 5 sec hold x 10 green  band bilateral  Standing Rt shoulder small circles at wall in 90 deg flexion arm outstretched 2 lb ball 30 x 2 each cw, ccw     TODAY'S TREATMENT:                                                                                 DATE: 02/23/2023:  Therex UBE fwd/back lvl 3.0 4 mins each way with 1 min rest between directions.  Pulley flexion and scaption 3 mins each way for ROM and eccentric lowering strength  Manual Supine Rt shoulder g3 inferior glides in flexion, scaption and abduction.  Mobilization c movement posterior gh glide with passive ER to tolerance  Neuro Re-ed (improve proprioception, neuro muscular control) Supine stabilizations with variable resistance by clinician in 45 deg abduction for ER/IR 30 sec bouts Supine 90 deg flexion stabilizations c variable resistance by clinician 30 sec bouts Supine 90 deg small circles Rt shoulder with arm outstretched 2 lb 30 x 2 cw, ccw each Standing reactive ER walk outs with towel under arm 5 sec hold x 10 green band  Tband rows green 2 x 15 bilateral Tband reactive ER walk out Rt shoulder c towel under arm 5 sec hold x 10     TODAY'S TREATMENT:                                                                                  DATE: 02/17/2023:  Manual Supine Rt shoulder passive range Grade 2-3  inferior joint mobs Rt GH joint and posterior glide   Therex: Pulleys: x 3 minutes flexion and 2 minutes abduction UBE: level 2.5 x 3 minutes each direction Reactive isometrics ER using red TB 2 x 10 walk outs for Rt shoulder Rows: green TB 2 x 10 holding 3 sec Supine shoulder at 90 deg circles c 1 # 60 sec each direciton Supine shoulder flexion c 1# 2 x 10   TherAct: Standing lifting 2# x 10 onto 1st and  2nd shelf in clinic gym Dead lift carry: 5 # 200 feet  c Rt UE Instructed in how to perform IR using a towel for ADL's and bathing   TODAY'S TREATMENT:                                                                                 DATE: 02/03/2023:  Manual Supine Rt shoulder passive range Grade 2-3  inferior joint mobs Rt GH joint and posterior glide   Therex: UBE UE only fwd/back each way with 1 min rest between lvl 2.0 Supine active Rt shoulder flexion to fatigue x15 in 90 seconds, x 23 in 2:53 seconds (cues for home use)   Supine wand 1 lb AAROM flexion x 15 (cues for home) Supine 90 deg flexion small circles cw, ccw 30 x each way Standing green band bilateral rows 2 x 15 Standing Rt arm ER isometric 5 sec on/off x 12 with towel under arm  Standing Rt arm extended at side extension isometric lower trap 5 sec on/off x 12    PATIENT EDUCATION: 02/03/2023  Education details: HEP update Person educated: Patient Education method: Programmer, multimedia, Demonstration, Verbal cues, and Handouts Education comprehension: verbalized understanding, returned demonstration, and verbal cues required  HOME EXERCISE PROGRAM: Access Code: [email or web address] URL: [email or web address] Date: 02/03/2023 Prepared by: Chyrel Masson  Exercises - Supine Shoulder Flexion Extension AAROM with Dowel  - 2-3 x daily - 7 x weekly - 1-2 sets - 10-15 reps - 3 hold - Supine  Shoulder Flexion Extension Full Range AROM  - 1-2 x daily - 7 x weekly - 1-2 sets - 15-50 reps - Standing Bilateral Low Shoulder Row with Anchored Resistance  - 1-2 x daily - 7 x weekly - 2-3 sets - 10-15 reps - Standing Isometric Shoulder Extension at Table  - 2-3 x daily - 7 x weekly - 1 sets - 10-15 reps - 5-10 hold - Standing Isometric Shoulder External Rotation with Doorway (Mirrored)  - 2-3 x daily - 7 x weekly - 1 sets - 10-15 reps - 5-10 hold  ASSESSMENT:  CLINICAL IMPRESSION: FOTO score continued to show improvement due to reducing difficulty in daily activity reported.  Pt to continue to benefit from progressive strengthening and coordination improvements to improve daily activity lifting/carrying.    OBJECTIVE IMPAIRMENTS: decreased mobility, decreased ROM, decreased strength, increased edema, impaired UE functional use, and pain.   ACTIVITY LIMITATIONS: lifting, sleeping, bed mobility, bathing, toileting, dressing, self feeding, reach over head, and hygiene/grooming  PARTICIPATION LIMITATIONS: meal prep, cleaning, laundry, driving, community activity, and occupation  PERSONAL FACTORS: 3+ comorbidities: see pertinent history above  are also affecting patient's functional outcome.   REHAB POTENTIAL: Good  CLINICAL DECISION MAKING: Stable/uncomplicated  EVALUATION COMPLEXITY: Low   GOALS: Goals reviewed with patient? Yes  SHORT TERM GOALS: (target date for Short term goals are 3 weeks 01/19/2023)  1.Patient will demonstrate independent use of home exercise program to maintain progress from in clinic treatments. Goal status: MET 01/13/23  LONG TERM GOALS: (target dates for all long term goals are 10 weeks  03/09/2023 )   1. Patient will demonstrate/report pain at worst less than or equal to 2/10 to  facilitate minimal limitation in daily activity secondary to pain symptoms. Goal status: on-going 03/03/2023   2. Patient will demonstrate independent use of home exercise program  to facilitate ability to maintain/progress functional gains from skilled physical therapy services. Goal status: on going 03/03/2023   3. Patient will demonstrate FOTO outcome > or = 60 % to indicate reduced disability due to condition. Goal status:  Mostly met 03/03/2023   4.  Patient will demonstrate Rt UE MMT >/= 4/5 throughout to facilitate lifting, reaching, carrying at [facility] in daily activity.   Goal status: on going 03/03/2023   5.  Patient will demonstrate Rt shoulder flexion to >/= 140 degrees to facilitate ADL's and functional mobility.    Goal status: on going 03/03/2023   6.  Pt will improve her Rt shoulder ER to >/= 60 degrees to improve her ADL's and functional mobility.  Goal status: Met    7.  pt will be able to lift 5 # from counter to over head shelf with her Rt UE with pain </= 2/10.  Goal Status: on going 03/03/2023  PLAN:  PT FREQUENCY: 1-2x/week  PT DURATION: 10 weeks  PLANNED INTERVENTIONS: Can include 16109- PT Re-evaluation, 97110-Therapeutic exercises, 97530- Therapeutic activity, 97112- Neuromuscular re-education, 97535- Self Care, 97140- Manual therapy, [phone]- Gait training, [phone]- Orthotic Fit/training, [phone]- Canalith repositioning, [id number]- Aquatic Therapy, 97014- Electrical stimulation (unattended), [id number]- Electrical stimulation (manual), [id number]- Vasopneumatic device, [id number]- Ultrasound, [id number]- Traction (mechanical), [id number]- Ionotophoresis 4mg /ml Dexamethasone, Patient/Family education, Balance training, Stair training, Taping, Dry Needling, Joint mobilization, Joint manipulation, Spinal manipulation, Spinal mobilization, Scar mobilization, Vestibular training, Visual/preceptual remediation/compensation, DME instructions, Cryotherapy, and Moist heat.  All performed as medically necessary.  All included unless contraindicated  PLAN FOR NEXT SESSION: MD note, possible HEP transitioning.   Lifting to tolerance per PA visit note on 01/27/2023.   Chyrel Masson, PT,  DPT, OCS, ATC 03/03/23  11:33 AM

## 2023-03-09 ENCOUNTER — Encounter: Payer: Self-pay | Admitting: Rehabilitative and Restorative Service Providers"

## 2023-03-09 ENCOUNTER — Ambulatory Visit (INDEPENDENT_AMBULATORY_CARE_PROVIDER_SITE_OTHER): Payer: BC Managed Care – PPO | Admitting: Rehabilitative and Restorative Service Providers"

## 2023-03-09 DIAGNOSIS — M6281 Muscle weakness (generalized): Secondary | ICD-10-CM | POA: Diagnosis not present

## 2023-03-09 DIAGNOSIS — M25511 Pain in right shoulder: Secondary | ICD-10-CM

## 2023-03-09 DIAGNOSIS — R600 Localized edema: Secondary | ICD-10-CM | POA: Diagnosis not present

## 2023-03-09 NOTE — Therapy (Signed)
 OUTPATIENT PHYSICAL THERAPY TREATMENT/ PROGRESS NOTE   Patient Name: MARGUERITA STAPP MRN: [id number] DOB:[birth year], 58 y.o., female Today's Date: 03/09/2023  Progress Note Reporting Period 01/27/2023 to 03/09/2023  See note below for Objective Data and Assessment of Progress/Goals.    END OF SESSION:  PT End of Session - 03/09/23 1112     Visit Number 11    Number of Visits 20    Date for PT Re-Evaluation 03/09/23    Authorization Type [employer] deductible met for 2024 - 30 visits    Authorization Time Period PN sent on 1/14 at 6th visit    PT Start Time 1129    PT Stop Time 1222    PT Time Calculation (min) 53 min    Activity Tolerance Patient tolerated treatment well    Behavior During Therapy [facility] for tasks assessed/performed               Past Medical History:  Diagnosis Date   Decreased pulse    normal ABIs 06/2016   Diabetes mellitus without complication (HCC)    Full dentures 08/2018   GERD (gastroesophageal reflux disease)    intermittent   Goiter    Hyperlipidemia    Hypertension    Obesity    Tobacco use    Vitamin D deficiency    Wears contact lenses    Past Surgical History:  Procedure Laterality Date   BREAST CYST ASPIRATION Right 2017   cyst    CHOLECYSTECTOMY     COLONOSCOPY  09/2015   tubular adenoma, repeat 5 years;  Dr. Marsa Aris   COLPOSCOPY     history of abnormal pap in remote past   ROTATOR CUFF REPAIR Right    12/24   STRABISMUS SURGERY     childhood   Patient Active Problem List   Diagnosis Date Noted   Diabetes mellitus type 2 with complications (HCC) 02/05/2022   Screening for lung cancer 06/13/2021   Need for pneumococcal vaccination 05/30/2020   Colon cancer screening 05/30/2020   Hyperlipidemia 09/21/2019   Nasal drainage 09/08/2019   Snoring 09/08/2019   Fatigue 09/08/2019   Sleep disturbance 09/08/2019   Ill-fitting dentures 09/08/2019   Vaccine counseling 09/22/2017   Influenza vaccination declined 09/22/2017    Right arm pain 02/26/2017   Vitamin D deficiency 07/09/2016   Screening for cervical cancer 07/09/2016   Pain in both thighs 06/23/2016   Decreased pedal pulses 06/23/2016   Encounter for health maintenance examination in adult 06/06/2015   Tobacco use disorder 06/06/2015   Screening for breast cancer 06/06/2015   Routine general medical examination at a health care facility 06/06/2015   Right shoulder pain 06/06/2015   Essential hypertension 04/17/2014   Goiter 04/17/2014   Obesity 04/17/2014   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  Reported overall improvement back to normal at 75-80%.  GROC rated at +6 a great deal better.   Pt indicated still having some difficulty reaching above head and lifting items.   PERTINENT HISTORY: HTN, obesity, vit D deficiency, hyperlipidemia, breast cyst aspiration, strabismus surgery  PAIN:  NPRS scale: at worst 3/10 in last few days Pain location: Rt shoulder Pain description: achy, throbbing Aggravating factors: movement, sleeping, certain positions Relieving factors: meds, changing positions  PRECAUTIONS: Shoulder  WEIGHT BEARING RESTRICTIONS: No  FALLS:  Has patient fallen in last 6 months? No  LIVING ENVIRONMENT: Lives with: lives with their family and lives with their spouse Lives in:  House/apartment Stairs: Yes: External: 3 steps; none Has following equipment at home: None  OCCUPATION: Urgent care reception  PLOF: Independent  PATIENT GOALS:stop hurting, be able to cook and complete house hold chores   OBJECTIVE:   DIAGNOSTIC FINDINGS:  12/29/2022 review of chart:   1. Full-thickness tear of the anterior 13 mm of the supraspinatus tendon footprint. Mild anterior supraspinatus muscle atrophy. 2. Mild tendinosis of the distal critical zone and footprint of the infraspinatus. 3. Very mild superior subscapularis tendinosis. 4. Mild to moderate degenerative changes of the acromioclavicular joint.  PATIENT SURVEYS:  03/03/2023: FOTO update: 58 %   01/27/23: FOTO update: 42%  12/29/22: FOTO intake:  28%    COGNITION: 12/29/2022 Overall cognitive status: WFL     SENSATION: 12/29/2022 WFL  POSTURE: 12/29/2022 Rounded shoulders and forward head  UPPER EXTREMITY ROM:   ROM Right 12/29/22 Left 12/29/22 Right 01/08/2023 PROM in supine - limited by pain Rt 01/13/23 PROM supine Rt 01/20/23 PROM supine Rt 01/27/23 PROM supine Rt 02/17/23 AROM supine Right 03/09/2023 AROM in supine  Shoulder flexion 100 158 128 142 145 152 140 155  Shoulder extension          Shoulder abduction   100 128 120 130 150   Shoulder adduction          Shoulder internal rotation 60 76 75 75 75 75 Shoulder abd 45 deg 70 Shoulder abd 45 deg 78 In shoulder abduction 45 deg  Shoulder external rotation 30 70 60 64 c pain  64 60 shoulder abd 45 deg 62 shoulder abd 45 deg 70  In shoulder abduction 45 deg  Elbow flexion 145 146        Elbow extension 0 0        Wrist flexion          Wrist extension          Wrist ulnar deviation          Wrist radial deviation          Wrist pronation          Wrist supination          (Blank rows = not tested)  UPPER EXTREMITY MMT:  MMT Right 12/29/2022 Left 12/29/2022 Right 02/03/2023 Right 02/23/2023 Right 03/09/2023   Shoulder flexion  4 2+/5 4/5 4+/5  Shoulder extension  4     Shoulder abduction  4  4/5 4+/5  Shoulder adduction       Shoulder internal rotation  4  5/5 5/5  Shoulder external rotation  4  4/5 4+/5  Middle trapezius       Lower trapezius       Elbow flexion       Elbow extension  Wrist flexion       Wrist extension       Wrist ulnar deviation       Wrist radial deviation       Wrist pronation       Wrist supination       Grip strength (lbs)       (Blank rows = not tested)                                                                                                                                                                                                 TODAY'S TREATMENT:                                                                                 DATE: 03/09/2023:  Therex UBE fwd/back lvl 3.0 4 mins each way with 1 min rest between directions.  Cross arm stretch Rt shoulder 30 sec x 3  Supine horizontal abduction green band 2 x 15  Standing doorway ER stretch 30 sec x 5 Rt arm in 90 deg abduction Verbal review of existing HEP and progression/additions.  Handout provided.   Manual Rt shoulder posterior glide with passive ER mobilization c movement in supine for ER mobility gains.   Neuro Re-ed (improve proprioception, neuro muscular control) Seated scapular retraction 5 sec hold x 5 (cues for home) Standing bilateral shoulder rows c scap retraction focus blue band 2 x 15 Standing bilateral shoulder GH ext blue band 2 x 15 Standing Rt shoulder ER c towel under arm walk outs 5 sec hold x 15 green band  Standing Rt shoulder small circles at wall in 90 deg flexion arm outstretched 2 lb ball 30 x 2 each cw, ccw   TODAY'S TREATMENT:                                                                                 DATE: 2 /18/2025:  Therex UBE fwd/back lvl 3.0 4 mins each way with  1 min rest between directions.  Pulley flexion and scaption 2 mins each way for ROM and  eccentric lowering strength  Neuro Re-ed (improve proprioception, neuro muscular control) Standing gh ext with higher band attachment blue band 2 x 15  Standing reactive ER walk outs with towel under arm 5 sec hold x 10 green  band bilateral  Standing Rt shoulder small circles at wall in 90 deg flexion arm outstretched 2 lb ball 30 x 2 each cw, ccw     TODAY'S TREATMENT:                                                                                 DATE: 02/23/2023:  Therex UBE fwd/back lvl 3.0 4 mins each way with 1 min rest between directions.  Pulley flexion and scaption 3 mins each way for ROM and eccentric lowering strength  Manual Supine Rt shoulder g3 inferior glides in flexion, scaption and abduction.  Mobilization c movement posterior gh glide with passive ER to tolerance  Neuro Re-ed (improve proprioception, neuro muscular control) Supine stabilizations with variable resistance by clinician in 45 deg abduction for ER/IR 30 sec bouts Supine 90 deg flexion stabilizations c variable resistance by clinician 30 sec bouts Supine 90 deg small circles Rt shoulder with arm outstretched 2 lb 30 x 2 cw, ccw each Standing reactive ER walk outs with towel under arm 5 sec hold x 10 green band  Tband rows green 2 x 15 bilateral Tband reactive ER walk out Rt shoulder c towel under arm 5 sec hold x 10     TODAY'S TREATMENT:                                                                                 DATE: 02/17/2023:  Manual Supine Rt shoulder passive range Grade 2-3  inferior joint mobs Rt GH joint and posterior glide   Therex: Pulleys: x 3 minutes flexion and 2 minutes abduction UBE: level 2.5 x 3 minutes each direction Reactive isometrics ER using red TB 2 x 10 walk outs for Rt shoulder Rows: green TB 2 x 10 holding 3 sec Supine shoulder at 90 deg circles c 1 # 60 sec each direciton Supine shoulder flexion c 1# 2 x 10   TherAct: Standing lifting 2# x 10 onto 1st and 2nd shelf in  clinic gym Dead lift carry: 5 # 200 feet  c Rt UE Instructed in how to perform IR using a towel for ADL's and bathing   TODAY'S TREATMENT:  DATE: 02/03/2023:  Manual Supine Rt shoulder passive range Grade 2-3  inferior joint mobs Rt GH joint and posterior glide   Therex: UBE UE only fwd/back each way with 1 min rest between lvl 2.0 Supine active Rt shoulder flexion to fatigue x15 in 90 seconds, x 23 in 2:53 seconds (cues for home use)   Supine wand 1 lb AAROM flexion x 15 (cues for home) Supine 90 deg flexion small circles cw, ccw 30 x each way Standing green band bilateral rows 2 x 15 Standing Rt arm ER isometric 5 sec on/off x 12 with towel under arm  Standing Rt arm extended at side extension isometric lower trap 5 sec on/off x 12    PATIENT EDUCATION: 03/09/2023  Education details: HEP update Person educated: Patient Education method: Programmer, multimedia, Demonstration, Verbal cues, and Handouts Education comprehension: verbalized understanding, returned demonstration, and verbal cues required  HOME EXERCISE PROGRAM: Access Code: [email or web address] URL: [email or web address] Date: 03/09/2023 Prepared by: Chyrel Masson  Exercises - Supine Shoulder Flexion Extension AAROM with Dowel  - 2-3 x daily - 7 x weekly - 1-2 sets - 10-15 reps - 3 hold - Supine Shoulder Horizontal Abduction with Resistance  - 1-2 x daily - 7 x weekly - 3 sets - 10 reps - Standing Bilateral Low Shoulder Row with Anchored Resistance  - 1-2 x daily - 7 x weekly - 2-3 sets - 10-15 reps - Shoulder Extension with Resistance  - 1-2 x daily - 7 x weekly - 2-3 sets - 10-15 reps - Shoulder External Rotation Reactive Isometrics (Mirrored)  - 1-2 x daily - 7 x weekly - 1 sets - 10 reps - 5-15 hold - Standing Shoulder Posterior Capsule Stretch (Mirrored)  - 2-3 x daily - 7 x weekly - 1 sets - 3-5 reps - 15-30 hold - Single Arm Doorway  Pec Stretch at 90 Degrees Abduction  - 2-3 x daily - 7 x weekly - 1 sets - 3-5 reps - 15-30 hold  ASSESSMENT:  CLINICAL IMPRESSION: The patient has attended 11 visits over the course of treatment cycle.  Patient has reported overall improvement at 75-80% with global rating of change at +6.  See objective data above for updated information regarding current presentation. Pt had continued to show gains in mobility and strength and could continue to benefit from combination of skilled PT and HEP.  Due to improvements, reduced frequency of skilled PT could be indicated with goals of HEP transitioning when able.     OBJECTIVE IMPAIRMENTS: decreased mobility, decreased ROM, decreased strength, increased edema, impaired UE functional use, and pain.   ACTIVITY LIMITATIONS: lifting, sleeping, bed mobility, bathing, toileting, dressing, self feeding, reach over head, and hygiene/grooming  PARTICIPATION LIMITATIONS: meal prep, cleaning, laundry, driving, community activity, and occupation  PERSONAL FACTORS: 3+ comorbidities: see pertinent history above  are also affecting patient's functional outcome.   REHAB POTENTIAL: Good  CLINICAL DECISION MAKING: Stable/uncomplicated  EVALUATION COMPLEXITY: Low   GOALS: Goals reviewed with patient? Yes  SHORT TERM GOALS: (target date for Short term goals are 3 weeks 01/19/2023)  1.Patient will demonstrate independent use of home exercise program to maintain progress from in clinic treatments. Goal status: MET 01/13/23  LONG TERM GOALS: (target dates for all long term goals are 10 weeks  03/09/2023 )   1. Patient will demonstrate/report pain at worst less than or equal to 2/10 to facilitate minimal limitation in daily activity secondary to pain symptoms. Goal status: on-going 03/03/2023   2. Patient will  demonstrate independent use of home exercise program to facilitate ability to maintain/progress functional gains from skilled physical therapy  services. Goal status: on going 03/03/2023   3. Patient will demonstrate FOTO outcome > or = 60 % to indicate reduced disability due to condition. Goal status:  Mostly met 03/03/2023   4.  Patient will demonstrate Rt UE MMT >/= 4/5 throughout to facilitate lifting, reaching, carrying at [facility] in daily activity.   Goal status: on going 03/03/2023   5.  Patient will demonstrate Rt shoulder flexion to >/= 140 degrees to facilitate ADL's and functional mobility.    Goal status: on going 03/03/2023   6.  Pt will improve her Rt shoulder ER to >/= 60 degrees to improve her ADL's and functional mobility.  Goal status: Met    7.  pt will be able to lift 5 # from counter to over head shelf with her Rt UE with pain </= 2/10.  Goal Status: on going 03/03/2023  PLAN:  PT FREQUENCY: 1-2x/week  PT DURATION: 10 weeks  PLANNED INTERVENTIONS: Can include 84132- PT Re-evaluation, 97110-Therapeutic exercises, 97530- Therapeutic activity, 97112- Neuromuscular re-education, 97535- Self Care, 97140- Manual therapy, [phone]- Gait training, [id number]- Orthotic Fit/training, [id number]- Canalith repositioning, [id number]- Aquatic Therapy, 97014- Electrical stimulation (unattended), [id number]- Electrical stimulation (manual), [id number]- Vasopneumatic device, [id number]- Ultrasound, [id number]- Traction (mechanical), [id number]- Ionotophoresis 4mg /ml Dexamethasone, Patient/Family education, Balance training, Stair training, Taping, Dry Needling, Joint mobilization, Joint manipulation, Spinal manipulation, Spinal mobilization, Scar mobilization, Vestibular training, Visual/preceptual remediation/compensation, DME instructions, Cryotherapy, and Moist heat.  All performed as medically necessary.  All included unless contraindicated  PLAN FOR NEXT SESSION: Progressive elevation strengthening.   Lifting to tolerance per PA visit note on 01/27/2023.   Chyrel Masson, PT, DPT, OCS, ATC 03/09/23  12:22 PM

## 2023-03-11 ENCOUNTER — Other Ambulatory Visit: Payer: Self-pay | Admitting: Medical

## 2023-03-13 ENCOUNTER — Ambulatory Visit: Payer: BC Managed Care – PPO | Admitting: Orthopaedic Surgery

## 2023-03-13 DIAGNOSIS — Z9889 Other specified postprocedural states: Secondary | ICD-10-CM

## 2023-03-13 DIAGNOSIS — M7541 Impingement syndrome of right shoulder: Secondary | ICD-10-CM

## 2023-03-13 DIAGNOSIS — M75121 Complete rotator cuff tear or rupture of right shoulder, not specified as traumatic: Secondary | ICD-10-CM

## 2023-03-13 NOTE — Progress Notes (Signed)
 Post-Op Visit Note   Patient: Ariel Sweeney           Date of Birth: 21-Nov-1965           MRN: [id number] Visit Date: 03/13/2023 PCP: Jac Canavan, PA-C   Assessment & Plan:  Chief Complaint:  Chief Complaint  Patient presents with   Right Shoulder - Pain   Visit Diagnoses:  1. S/P right rotator cuff repair   2. Nontraumatic complete tear of right rotator cuff   3. Impingement syndrome of right shoulder     Plan: [name] is 12 weeks postop from a right rotator cuff repair.  She is doing well overall.  Not requiring any pain medications.  She is doing physical therapy once a week.  Examination of the right shoulder shows significant improvement in range of motion and strength.  Surgical scars are fully healed.  Shoulder function is excellent.  From my standpoint [name] has done very well from the surgery.  Will let her finish out physical therapy.  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  If she is having problems or any concerns or issues she can follow-up with [place] in 3 months but if she is doing well she can follow-up with [place] as needed.  Follow-Up Instructions: No follow-ups on file.   Orders:  No orders of the defined types were placed in this encounter.  No orders of the defined types were placed in this encounter.   Imaging: No results found.  PMFS History: Patient Active Problem List   Diagnosis Date Noted   Diabetes mellitus type 2 with complications (HCC) 02/05/2022   Screening for lung cancer 06/13/2021   Need for pneumococcal vaccination 05/30/2020   Colon cancer screening 05/30/2020   Hyperlipidemia 09/21/2019   Nasal drainage 09/08/2019   Snoring 09/08/2019   Fatigue 09/08/2019   Sleep disturbance 09/08/2019   Ill-fitting dentures 09/08/2019   Vaccine counseling 09/22/2017   Influenza vaccination declined 09/22/2017   Right arm pain 02/26/2017   Vitamin D deficiency 07/09/2016   Screening for cervical cancer 07/09/2016    Pain in both thighs 06/23/2016   Decreased pedal pulses 06/23/2016   Encounter for health maintenance examination in adult 06/06/2015   Tobacco use disorder 06/06/2015   Screening for breast cancer 06/06/2015   Routine general medical examination at a health care facility 06/06/2015   Right shoulder pain 06/06/2015   Essential hypertension 04/17/2014   Goiter 04/17/2014   Obesity 04/17/2014   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   Past Medical History:  Diagnosis Date   Decreased pulse    normal ABIs 06/2016   Diabetes mellitus without complication (HCC)    Full dentures 08/2018   GERD (gastroesophageal reflux disease)    intermittent   Goiter    Hyperlipidemia    Hypertension    Obesity    Tobacco use    Vitamin D deficiency    Wears contact lenses     Family History  Problem Relation Age of Onset   Diabetes Mother    Hypertension Mother    Kidney disease Mother    Anemia Mother    Hypertension Father    Diabetes Father    Heart disease Father    Hypertension Sister    Stroke Maternal Aunt    Cancer Neg Hx    Colon cancer Neg Hx    Colon polyps Neg Hx    Esophageal cancer Neg Hx    Rectal cancer Neg Hx  Stomach cancer Neg Hx     Past Surgical History:  Procedure Laterality Date   BREAST CYST ASPIRATION Right 2017   cyst    CHOLECYSTECTOMY     COLONOSCOPY  09/2015   tubular adenoma, repeat 5 years;  Dr. Marsa Aris   COLPOSCOPY     history of abnormal pap in remote past   ROTATOR CUFF REPAIR Right    12/24   STRABISMUS SURGERY     childhood   Social History   Occupational History   Not on file  Tobacco Use   Smoking status: Former    Current packs/day: 0.25    Average packs/day: 0.3 packs/day for 27.0 years (6.8 ttl pk-yrs)    Types: Cigarettes   Smokeless tobacco: Never  Vaping Use   Vaping status: Never Used  Substance and Sexual Activity   Alcohol use: Yes    Alcohol/week: 3.0 standard drinks of alcohol    Types: 1 Glasses of wine, 1 Cans of  beer, 1 Shots of liquor per week    Comment: 4 weekly   Drug use: No   Sexual activity: Not on file

## 2023-03-23 ENCOUNTER — Encounter: Payer: BC Managed Care – PPO | Admitting: Physical Therapy

## 2023-04-01 ENCOUNTER — Ambulatory Visit (INDEPENDENT_AMBULATORY_CARE_PROVIDER_SITE_OTHER): Payer: BC Managed Care – PPO | Admitting: Rehabilitative and Restorative Service Providers"

## 2023-04-01 ENCOUNTER — Encounter: Payer: Self-pay | Admitting: Rehabilitative and Restorative Service Providers"

## 2023-04-01 DIAGNOSIS — M25511 Pain in right shoulder: Secondary | ICD-10-CM | POA: Diagnosis not present

## 2023-04-01 DIAGNOSIS — R600 Localized edema: Secondary | ICD-10-CM | POA: Diagnosis not present

## 2023-04-01 DIAGNOSIS — M6281 Muscle weakness (generalized): Secondary | ICD-10-CM | POA: Diagnosis not present

## 2023-04-01 NOTE — Therapy (Addendum)
 OUTPATIENT PHYSICAL THERAPY TREATMENT / RECERT  / DISCHARGE   Patient Name: Ariel Sweeney MRN: [id number] DOB:August 15, 1965, 58 y.o., female Today's Date: 04/01/2023  Progress Note Reporting Period 03/09/2023 to 04/01/2023  See note below for Objective Data and Assessment of Progress/Goals.        END OF SESSION:  PT End of Session - 04/01/23 1110     Visit Number 12    Number of Visits 20    Date for PT Re-Evaluation 04/01/23    Authorization Type [employer] deductible met for 2024 - 30 visits    Authorization Time Period PN sent on 1/14 at 6th visit    PT Start Time 1103    PT Stop Time 1128    PT Time Calculation (min) 25 min    Activity Tolerance Patient tolerated treatment well    Behavior During Therapy [facility] for tasks assessed/performed                Past Medical History:  Diagnosis Date   Decreased pulse    normal ABIs 06/2016   Diabetes mellitus without complication (HCC)    Full dentures 08/2018   GERD (gastroesophageal reflux disease)    intermittent   Goiter    Hyperlipidemia    Hypertension    Obesity    Tobacco use    Vitamin D deficiency    Wears contact lenses    Past Surgical History:  Procedure Laterality Date   BREAST CYST ASPIRATION Right 2017   cyst    CHOLECYSTECTOMY     COLONOSCOPY  09/2015   tubular adenoma, repeat 5 years;  Dr. Darleen Ee   COLPOSCOPY     history of abnormal pap in remote past   ROTATOR CUFF REPAIR Right    12/24   STRABISMUS SURGERY     childhood   Patient Active Problem List   Diagnosis Date Noted   Diabetes mellitus type 2 with complications (HCC) 02/05/2022   Screening for lung cancer 06/13/2021   Need for pneumococcal vaccination 05/30/2020   Colon cancer screening 05/30/2020   Hyperlipidemia 09/21/2019   Nasal drainage 09/08/2019   Snoring 09/08/2019   Fatigue 09/08/2019   Sleep disturbance 09/08/2019   Ill-fitting dentures 09/08/2019   Vaccine counseling 09/22/2017   Influenza vaccination  declined 09/22/2017   Right arm pain 02/26/2017   Vitamin D deficiency 07/09/2016   Screening for cervical cancer 07/09/2016   Pain in both thighs 06/23/2016   Decreased pedal pulses 06/23/2016   Encounter for health maintenance examination in adult 06/06/2015   Tobacco use disorder 06/06/2015   Screening for breast cancer 06/06/2015   Routine general medical examination at a health care facility 06/06/2015   Right shoulder pain 06/06/2015   Essential hypertension 04/17/2014   Goiter 04/17/2014   Obesity 04/17/2014   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  Pt indicated having cramp in neck as chief complaint.  Reported getting back to work.   Pt reported a fall over mesh.    PERTINENT HISTORY: HTN, obesity, vit D deficiency, hyperlipidemia, breast cyst aspiration, strabismus surgery  PAIN:  NPRS scale: no shoulder pain Pain location: Rt shoulder Pain description: achy, throbbing Aggravating factors: movement, sleeping, certain positions Relieving factors: meds, changing positions  PRECAUTIONS: Shoulder  WEIGHT BEARING RESTRICTIONS: No  FALLS:  Has patient fallen in last 6 months? No  LIVING ENVIRONMENT: Lives with: lives with their family and lives with their spouse Lives in: House/apartment Stairs: Yes: External: 3 steps; none Has following  equipment at home: None  OCCUPATION: Urgent care reception  PLOF: Independent  PATIENT GOALS:stop hurting, be able to cook and complete house hold chores   OBJECTIVE:   DIAGNOSTIC FINDINGS:  12/29/2022 review of chart:   1. Full-thickness tear of the anterior 13 mm of the supraspinatus tendon footprint. Mild anterior supraspinatus muscle atrophy. 2. Mild tendinosis of the distal critical zone and footprint of the infraspinatus. 3. Very mild superior subscapularis tendinosis. 4. Mild to moderate degenerative changes of the acromioclavicular joint.  PATIENT SURVEYS:  04/01/2023: FOTO update:  67 %  03/03/2023: FOTO update: 58 %   01/27/23: FOTO update: 42%  12/29/22: FOTO intake:  28%    COGNITION: 12/29/2022 Overall cognitive status: WFL     SENSATION: 12/29/2022 WFL  POSTURE: 12/29/2022 Rounded shoulders and forward head  UPPER EXTREMITY ROM:   ROM Right 12/29/22 Left 12/29/22 Right 01/08/2023 PROM in supine - limited by pain Rt 01/13/23 PROM supine Rt 01/20/23 PROM supine Rt 01/27/23 PROM supine Rt 02/17/23 AROM supine Right 03/09/2023 AROM in supine  Shoulder flexion 100 158 128 142 145 152 140 155  Shoulder extension          Shoulder abduction   100 128 120 130 150   Shoulder adduction          Shoulder internal rotation 60 76 75 75 75 75 Shoulder abd 45 deg 70 Shoulder abd 45 deg 78 In shoulder abduction 45 deg  Shoulder external rotation 30 70 60 64 c pain  64 60 shoulder abd 45 deg 62 shoulder abd 45 deg 70  In shoulder abduction 45 deg  Elbow flexion 145 146        Elbow extension 0 0        Wrist flexion          Wrist extension          Wrist ulnar deviation          Wrist radial deviation          Wrist pronation          Wrist supination          (Blank rows = not tested)  UPPER EXTREMITY MMT:  MMT Right 12/29/2022 Left 12/29/2022 Right 02/03/2023 Right 02/23/2023 Right 03/09/2023  Shoulder flexion  4 2+/5 4/5 4+/5   Shoulder extension  4     Shoulder abduction  4  4/5 4+/5  Shoulder adduction       Shoulder internal rotation  4  5/5 5/5  Shoulder external rotation  4  4/5 4+/5  Middle trapezius       Lower trapezius       Elbow flexion       Elbow extension       Wrist flexion  Wrist extension       Wrist ulnar deviation       Wrist radial deviation       Wrist pronation       Wrist supination       Grip strength (lbs)       (Blank rows = not tested)                                                                                                                                                                                                 TODAY'S TREATMENT:                                                                                 DATE: 04/01/2023:  Therex UBE fwd/back lvl 3.0 4 mins each way with 1 min rest between directions.    Manual Percussive device to Rt upper trap      TODAY'S TREATMENT:                                                                                 DATE: 03/09/2023:  Therex UBE fwd/back lvl 3.0 4 mins each way with 1 min rest between directions.  Cross arm stretch Rt shoulder 30 sec x 3  Supine horizontal abduction green band 2 x 15  Standing doorway ER stretch 30 sec x 5 Rt arm in 90 deg abduction Verbal review of existing HEP and progression/additions.  Handout provided.   Manual Rt shoulder posterior glide with passive ER mobilization c movement in supine for ER mobility gains.   Neuro Re-ed (improve proprioception, neuro muscular control) Seated scapular retraction 5 sec hold x 5 (cues for home) Standing bilateral shoulder rows c scap retraction focus blue band 2 x 15 Standing bilateral shoulder GH ext blue band 2 x 15 Standing Rt shoulder ER c towel under arm walk outs 5 sec hold x 15 green band  Standing Rt shoulder small circles at wall in 90 deg flexion arm outstretched  2 lb ball 30 x 2 each cw, ccw   TODAY'S TREATMENT:                                                                                  DATE: 2 /18/2025:  Therex UBE fwd/back lvl 3.0 4 mins each way with 1 min rest between directions.  Pulley flexion and scaption 2 mins each way for ROM and eccentric lowering strength  Neuro Re-ed (improve proprioception, neuro muscular control) Standing gh ext with higher band attachment blue band 2 x 15  Standing reactive ER walk outs with towel under arm 5 sec hold x 10 green  band bilateral  Standing Rt shoulder small circles at wall in 90 deg flexion arm outstretched 2 lb ball 30 x 2 each cw, ccw     TODAY'S TREATMENT:                                                                                 DATE: 02/23/2023:  Therex UBE fwd/back lvl 3.0 4 mins each way with 1 min rest between directions.  Pulley flexion and scaption 3 mins each way for ROM and eccentric lowering strength  Manual Supine Rt shoulder g3 inferior glides in flexion, scaption and abduction.  Mobilization c movement posterior gh glide with passive ER to tolerance  Neuro Re-ed (improve proprioception, neuro muscular control) Supine stabilizations with variable resistance by clinician in 45 deg abduction for ER/IR 30 sec bouts Supine 90 deg flexion stabilizations c variable resistance by clinician 30 sec bouts Supine 90 deg small circles Rt shoulder with arm outstretched 2 lb 30 x 2 cw, ccw each Standing reactive ER walk outs with towel under arm 5 sec hold x 10 green band  Tband rows green 2 x 15 bilateral Tband reactive ER walk out Rt shoulder c towel under arm 5 sec hold x 10   PATIENT EDUCATION: 04/01/2023  Education details: HEP review Person educated: Patient Education method: Programmer, multimedia, Facilities manager, Verbal cues, and Handouts Education comprehension: verbalized understanding, returned demonstration, and verbal cues required  HOME EXERCISE PROGRAM: Access Code: [email or web address] URL: [email or web address] Date: 04/01/2023 Prepared by:  Bonna Bustard  Exercises - Supine Shoulder Flexion Extension AAROM with Dowel  - 2-3 x daily - 7 x weekly - 1-2 sets - 10-15 reps - 3 hold - Standing shoulder flexion wall slides  - 2-3 x daily - 7 x weekly - 1 sets - 10 reps - 5 hold - Standing Shoulder Posterior Capsule Stretch (Mirrored)  - 2-3 x daily - 7 x weekly - 1 sets - 3-5 reps - 15-30 hold - Single Arm Doorway Pec Stretch at 90 Degrees Abduction  - 2-3 x daily - 7 x weekly - 1 sets - 3-5 reps - 15-30 hold - Supine Shoulder Horizontal Abduction with Resistance  - 1 x daily - 7 x weekly - 3 sets -  10 reps - Standing Bilateral Low Shoulder Row with Anchored Resistance  - 1 x daily - 7 x weekly - 2-3 sets - 10-15 reps - Shoulder Extension with Resistance  - 1 x daily - 7 x weekly - 2-3 sets - 10-15 reps - Shoulder External Rotation Reactive Isometrics (Mirrored)  - 1 x daily - 7 x weekly - 1 sets - 10 reps - 5-15 hold  ASSESSMENT:  CLINICAL IMPRESSION: Pt returned today after approx. 3-4 weeks since last visit with return to work indicated in that time.  Recert was required today to cover todays visit.  Pt continued to show improvement in functional outcome measurement (FOTO) as well as daily reported symptoms and ability.  At this time, recommend trial HEP period for continued gains.     OBJECTIVE IMPAIRMENTS: decreased mobility, decreased ROM, decreased strength, increased edema, impaired UE functional use, and pain.   ACTIVITY LIMITATIONS: lifting, sleeping, bed mobility, bathing, toileting, dressing, self feeding, reach over head, and hygiene/grooming  PARTICIPATION LIMITATIONS: meal prep, cleaning, laundry, driving, community activity, and occupation  PERSONAL FACTORS: 3+ comorbidities: see pertinent history above  are also affecting patient's functional outcome.   REHAB POTENTIAL: Good  CLINICAL DECISION MAKING: Stable/uncomplicated  EVALUATION COMPLEXITY: Low   GOALS: Goals reviewed with patient? Yes  SHORT TERM  GOALS: (target date for Short term goals are 3 weeks 01/19/2023)  1.Patient will demonstrate independent use of home exercise program to maintain progress from in clinic treatments. Goal status: MET 01/13/23  LONG TERM GOALS: (target dates for all long term goals are today  04/01/2023 )   1. Patient will demonstrate/report pain at worst less than or equal to 2/10 to facilitate minimal limitation in daily activity secondary to pain symptoms. Goal status: Mostly Met 04/01/2023   2. Patient will demonstrate independent use of home exercise program to facilitate ability to maintain/progress functional gains from skilled physical therapy services. Goal status: Met 03/31/2023   3. Patient will demonstrate FOTO outcome > or = 60 % to indicate reduced disability due to condition. Goal status:  Met 03/31/2023   4.  Patient will demonstrate Rt UE MMT >/= 4/5 throughout to facilitate lifting, reaching, carrying at [facility] in daily activity.   Goal status: Met 03/31/2023   5.  Patient will demonstrate Rt shoulder flexion to >/= 140 degrees to facilitate ADL's and functional mobility.    Goal status: mostly Met 03/31/2023   6.  Pt will improve her Rt shoulder ER to >/= 60 degrees to improve her ADL's and functional mobility.  Goal status: Met    7.  pt will be able to lift 5 # from counter to over head shelf with her Rt UE with pain </= 2/10.  Goal Status: Met 03/31/2023  PLAN:  PT FREQUENCY: 1-2x/week  PT DURATION: 10 weeks  PLANNED INTERVENTIONS: Can include 16109- PT Re-evaluation, 97110-Therapeutic exercises, 97530- Therapeutic activity, 97112- Neuromuscular re-education, 97535- Self Care, 97140- Manual therapy, [phone]- Gait training, [phone]- Orthotic Fit/training, [phone]- Canalith repositioning, [id number]- Aquatic Therapy, 97014- Electrical stimulation (unattended), [id number]- Electrical stimulation (manual), [id number]- Vasopneumatic device, [id number]- Ultrasound, [id number]- Traction (mechanical), [id number]- Ionotophoresis  4mg /ml Dexamethasone, Patient/Family education, Balance training, Stair training, Taping, Dry Needling, Joint mobilization, Joint manipulation, Spinal manipulation, Spinal mobilization, Scar mobilization, Vestibular training, Visual/preceptual remediation/compensation, DME instructions, Cryotherapy, and Moist heat.  All performed as medically necessary.  All included unless contraindicated  PLAN FOR NEXT SESSION: Trial HEP. Discharge after 30 days inactivity.   Lifting to tolerance per PA visit note on  01/27/2023.   Bonna Bustard, PT, DPT, OCS, ATC 04/01/23  11:32 AM  PHYSICAL THERAPY DISCHARGE SUMMARY  Visits from Start of Care: 12  Current functional level related to goals / functional outcomes: See note   Remaining deficits: See note   Education / Equipment: HEP  Patient goals were met. Patient is being discharged due to not returning since the last visit.  Bonna Bustard, PT, DPT, OCS, ATC 04/30/23  12:14 PM

## 2023-04-13 ENCOUNTER — Other Ambulatory Visit: Payer: Self-pay | Admitting: Medical

## 2023-05-28 ENCOUNTER — Ambulatory Visit: Admitting: Medical

## 2023-05-28 ENCOUNTER — Other Ambulatory Visit (HOSPITAL_COMMUNITY)
Admission: RE | Admit: 2023-05-28 | Discharge: 2023-05-28 | Disposition: A | Discharge status: Home / Self Care | Source: Ambulatory Visit | Attending: Medical | Admitting: Medical

## 2023-05-28 VITALS — BP 110/70 | HR 82 | Temp 98.2°F | Wt 195.4 lb

## 2023-05-28 DIAGNOSIS — R1030 Lower abdominal pain, unspecified: Secondary | ICD-10-CM

## 2023-05-28 DIAGNOSIS — E118 Type 2 diabetes mellitus with unspecified complications: Secondary | ICD-10-CM

## 2023-05-28 DIAGNOSIS — Z124 Encounter for screening for malignant neoplasm of cervix: Secondary | ICD-10-CM | POA: Insufficient documentation

## 2023-05-28 DIAGNOSIS — I10 Essential (primary) hypertension: Secondary | ICD-10-CM | POA: Diagnosis not present

## 2023-05-28 DIAGNOSIS — Z87891 Personal history of nicotine dependence: Secondary | ICD-10-CM

## 2023-05-28 DIAGNOSIS — N1831 Chronic kidney disease, stage 3a: Secondary | ICD-10-CM

## 2023-05-28 LAB — POCT URINALYSIS DIP (PROADVANTAGE DEVICE)
Glucose, UA: NEGATIVE mg/dL
Ketones, POC UA: NEGATIVE mg/dL
Leukocytes, UA: NEGATIVE
Nitrite, UA: NEGATIVE
Protein Ur, POC: 100 mg/dL — AB
Specific Gravity, Urine: 1.01
Urobilinogen, Ur: 0.2
pH, UA: 6 (ref 5.0–8.0)

## 2023-05-28 LAB — HEMOGLOBIN A1C
Est. average glucose Bld gHb Est-mCnc: 200 mg/dL
Hgb A1c MFr Bld: 8.6 % — ABNORMAL HIGH (ref 4.8–5.6)

## 2023-05-28 LAB — BASIC METABOLIC PANEL WITH GFR
BUN/Creatinine Ratio: 11 (ref 9–23)
BUN: 14 mg/dL (ref 6–24)
CO2: 20 mmol/L (ref 20–29)
Calcium: 9.5 mg/dL (ref 8.7–10.2)
Chloride: 100 mmol/L (ref 96–106)
Creatinine, Ser: 1.3 mg/dL — ABNORMAL HIGH (ref 0.57–1.00)
Glucose: 193 mg/dL — ABNORMAL HIGH (ref 70–99)
Potassium: 3.5 mmol/L (ref 3.5–5.2)
Sodium: 138 mmol/L (ref 134–144)
eGFR: 48 mL/min/{1.73_m2} — ABNORMAL LOW (ref >59)

## 2023-05-28 MED ORDER — VALSARTAN-HYDROCHLOROTHIAZIDE 160-12.5 MG PO TABS: 1.0000 | ORAL_TABLET | Freq: Every day | ORAL | 1 refills | Status: DC

## 2023-05-28 MED ORDER — NITROFURANTOIN MONOHYD MACRO 100 MG PO CAPS: 100.0000 mg | ORAL_CAPSULE | Freq: Two times a day (BID) | ORAL | 0 refills | Status: DC

## 2023-05-28 NOTE — Progress Notes (Addendum)
 Subjective:   Ariel Sweeney is a 58 y.o. female who complains of possible urinary tract infection.   Chief Complaint  Patient presents with   Urinary Tract Infection    CC: having UTI symptoms that started x 2wks pain in lower stomach, no lower back pain, no burning while urinating. Take tylenol  for the pain.    Symptoms began 2 weeks ago.  Symptoms include lower abdominal pain and pressure.   No blood in urine, no urinary frequency, sometimes urgency.   No fever.  No NVD.  No body aches or chills.  No vaginal discharge.   Married, no other partner.  Last UTI was maybe yeas ago.   Using tylenol  some for current symptoms.    They deny any recent change in soap or hygiene products.   They deny  discharge or suspected genital infection.  Not checking sugars.    Gets up to urinate typically once per night.   No dark urine, no conniption, has daily BM.    Quit smoking 10/2022  Had shoulder surgery 2023/01/24.  She was found to have low potassium at the time.  Needs this rechecked.  Sister died in 01/24/2023.   No other aggravating or relieving factors.  No other c/o.  Past Medical History:  Diagnosis Date   Decreased pulse    normal ABIs 06/2016   Diabetes mellitus without complication (HCC)    Full dentures 08/2018   GERD (gastroesophageal reflux disease)    intermittent   Goiter    Hyperlipidemia    Hypertension    Obesity    Tobacco use    Vitamin D  deficiency    Wears contact lenses     Current Outpatient Medications on File Prior to Visit  Medication Sig Dispense Refill   amLODipine  (NORVASC ) 10 MG tablet Take 1 tablet (10 mg total) by mouth daily. 90 tablet 3   aspirin  EC (EQ ASPIRIN  ADULT LOW DOSE) 81 MG tablet Take 1 tablet (81 mg total) by mouth daily. Swallow whole. 90 tablet 3   fenofibrate  54 MG tablet Take 1 tablet by mouth once daily 90 tablet 1   rosuvastatin  (CRESTOR ) 10 MG tablet Take 1 tablet (10 mg total) by mouth daily. 90 tablet 2   No current  facility-administered medications on file prior to visit.   Past Surgical History:  Procedure Laterality Date   BREAST CYST ASPIRATION Right 2017   cyst    CHOLECYSTECTOMY     COLONOSCOPY  09/2015   tubular adenoma, repeat 5 years;  Dr. Darleen Ee   COLPOSCOPY     history of abnormal pap in remote past   ROTATOR CUFF REPAIR Right    12/24   STRABISMUS SURGERY     childhood    ROS as in subjective  Reviewed allergies, medications, past medical, surgical, and social history.       Objective: BP 110/70   Pulse 82   Temp 98.2 F (36.8 C)   Wt 195 lb 6.4 oz (88.6 kg)   SpO2 94%   BMI 35.74 kg/m   BP Readings from Last 3 Encounters:  05/28/23 110/70  02/04/23 128/82  10/16/22 130/86   Wt Readings from Last 3 Encounters:  05/28/23 195 lb 6.4 oz (88.6 kg)  02/04/23 185 lb (83.9 kg)  01/05/23 185 lb (83.9 kg)    General appearance: alert, no distress, WD/WN Abdomen: +bs, soft, non tender, non distended, no masses, no hepatomegaly, no splenomegaly, no bruits Back: no CVA tenderness GU:  deferred MSK: Legs nontender, hip range of motion normal, no obvious deformity.  Pelvic: Normal external female genitalia, there is some atrophic changes noted, cervix normal appearing, swabs taken, adnexa and uterus nontender and no enlargement or mass.  Exam chaperoned by nurse   Diabetic Foot Exam - Simple   Simple Foot Form Diabetic Foot exam was performed with the following findings: Yes 05/28/2023 10:51 AM  Visual Inspection No deformities, no ulcerations, no other skin breakdown bilaterally: Yes Sensation Testing Intact to touch and monofilament testing bilaterally: Yes Pulse Check See comments: Yes Comments 1+ pedal pulses        Assessment: Encounter Diagnoses  Name Primary?   Lower abdominal pain Yes   CKD stage 3a, GFR 45-59 ml/min (HCC)    Essential hypertension    Diabetes mellitus type 2 with complications [facility])    Former smoker    Screening for  cervical cancer      Plan: I expressed sympathy for her sister's passing in December   Lower abdominal pain It is not exactly clear what is causing your symptoms You do not have obvious symptoms of urinary tract infection or vaginal infection and you do not seem to be bothered by motion of your hips or legs We will check some additional labs today Over the weekend if you have worse symptoms such as fever, burning with urination, blood in the urine, urinary frequency then begin Macrobid antibiotic sent to the pharmacy Go ahead and use some MiraLAX over-the-counter 1 capful or 1 dose daily through the weekend to help clear out any back up poop No obvious tenderness on pelvic exam today Consider CT abdomen/pelvis if symptoms persist  Hypertension Continue amlodipine  10 mg daily Continue valsartan  HCT 160/12.5 mg daily   CKD-chronic kidney disease stage IIIa You have chronic kidney disease most likely related to history of hypertension Drink at least 80 to 100 ounces of water daily Due to abnormal kidney function, and in order to protect your kidneys, I recommend you avoid medications that can harm the kidneys such as ibuprofen, Aleve, Advil, Motrin, Naprosyn, or prescription anti-inflammatories which are used for pain, inflammation, and arthritis.   You should avoid dehydration which can harm the kidneys. The goal is to keep your blood pressure 130/80 or less and to keep your diabetes hemoglobin A1c marker 7% or less Avoid dehydration   Diabetes You have previously been diet controlled Given your kidney marker we may want to consider medication to help lower your sugars and control overall complication risk Labs today  I am glad you were able to quit smoking!   Hyperlipidemia Continue fenofibrate  54 mg daily Continue Crestor  10 mg daily Pravachol  was still listed in her chart but you should not be also taking Pravachol  as this will be duplicating your cholesterol medication so  make sure you are only taking rosuvastatin  Crestor , not Pravachol    Screen for cervical cancer - we updated pap smear today   Ariel Sweeney was seen today for urinary tract infection.  Diagnoses and all orders for this visit:  Lower abdominal pain -     Urine Culture  CKD stage 3a, GFR 45-59 ml/min (HCC)  Essential hypertension -     Basic metabolic panel with GFR  Diabetes mellitus type 2 with complications (HCC) -     Hemoglobin A1c  Former smoker  Screening for cervical cancer -     Cytology - PAP([place])  Other orders -     nitrofurantoin, macrocrystal-monohydrate, (MACROBID) 100 MG capsule;  Take 1 capsule (100 mg total) by mouth 2 (two) times daily. -     valsartan -hydrochlorothiazide  (DIOVAN -HCT) 160-12.5 MG tablet; Take 1 tablet by mouth daily.    Return for CPX physical fasting.

## 2023-05-28 NOTE — Addendum Note (Signed)
 Addended by: Charliene Conte A on: 05/28/2023 11:46 AM   Modules accepted: Orders

## 2023-05-28 NOTE — Patient Instructions (Addendum)
 Lower abdominal pain It is not exactly clear what is causing your symptoms You do not have obvious symptoms of urinary tract infection or vaginal infection and you do not seem to be bothered by motion of your hips or legs We will check some additional labs today Over the weekend if you have worse symptoms such as fever, burning with urination, blood in the urine, urinary frequency then begin Macrobid antibiotic sent to the pharmacy Go ahead and use some MiraLAX over-the-counter 1 capful or 1 dose daily through the weekend to help clear out any back up pooped  Hypertension Continue amlodipine  10 mg daily Continue valsartan  HCT 160/12.5 mg daily  CKD-chronic kidney disease stage IIIa You have chronic kidney disease most likely related to history of hypertension Drink at least 80 to 100 ounces of water daily Due to abnormal kidney function, and in order to protect your kidneys, I recommend you avoid medications that can harm the kidneys such as ibuprofen, Aleve, Advil, Motrin, Naprosyn, or prescription anti-inflammatories which are used for pain, inflammation, and arthritis.   You should avoid dehydration which can harm the kidneys. The goal is to keep your blood pressure 130/80 or less and to keep your diabetes hemoglobin A1c marker 7% or less Avoid dehydration   Diabetes You have previously been diet controlled Given your kidney marker we may want to consider medication to help lower your sugars and control overall complication risk Labs today  I am glad you were able to quit smoking!   Hyperlipidemia Continue fenofibrate  54 mg daily Continue Crestor  10 mg daily Pravachol  was still listed in her chart but you should not be also taking Pravachol  as this will be duplicating your cholesterol medication so make sure you are only taking rosuvastatin  Crestor , not Pravachol    Screen for cervical cancer - we updated pap smear today

## 2023-05-29 ENCOUNTER — Ambulatory Visit: Payer: Self-pay | Admitting: Medical

## 2023-05-29 LAB — CYTOLOGY - PAP
Adequacy: ABSENT
Comment: NEGATIVE
Diagnosis: NEGATIVE
High risk HPV: NEGATIVE

## 2023-05-29 NOTE — Progress Notes (Signed)
 Results sent through MyChart

## 2023-05-29 NOTE — Progress Notes (Signed)
 Still pending Pap smear.  Blood sugar and diabetes marker is elevated unfortunately.  Kidney marker has gotten a little worse as well since last check  I recommend being aggressive with diabetes control.  I would recommend a trial of a medication such as Mounjaro or Ozempic  weekly injectable to control sugars and help with weight loss.  The diabetes numbers may be in part why the kidney marker has gotten worse.  If you do not want to do this medicine there are other options but I think the injectables that work really well.  My nurse can tell you more about this  Make sure you are drinking at least to 100 ounces of water daily.  Continue medicines as usual

## 2023-05-30 LAB — URINE CULTURE

## 2023-06-01 NOTE — Progress Notes (Signed)
 Urine culture + so make sure she has begun the Macrobid  antibiotic.   See about the diabetes medications I mentioned

## 2023-06-03 ENCOUNTER — Other Ambulatory Visit: Payer: Self-pay | Admitting: Medical

## 2023-06-03 ENCOUNTER — Telehealth: Payer: Self-pay

## 2023-06-03 ENCOUNTER — Other Ambulatory Visit (HOSPITAL_COMMUNITY): Payer: Self-pay

## 2023-06-03 ENCOUNTER — Telehealth: Payer: Self-pay | Admitting: Internal Medicine

## 2023-06-03 MED ORDER — MOUNJARO 2.5 MG/0.5ML ~~LOC~~ SOAJ: 2.5000 mg | SUBCUTANEOUS | 0 refills | Status: DC

## 2023-06-03 MED ORDER — MOUNJARO 5 MG/0.5ML ~~LOC~~ SOAJ: 5.0000 mg | SUBCUTANEOUS | 0 refills | Status: DC

## 2023-06-03 NOTE — Telephone Encounter (Signed)
 Pt was notified.

## 2023-06-03 NOTE — Telephone Encounter (Signed)
 Copied from CRM [id number]. Topic: Clinical - Medical Advice >> Jun 03, 2023 11:28 AM Carlatta H wrote: Reason for CRM: Patient was told to call back with information from insurance company about being approved//Mounjaro or Ozempic  will be covered by insurance they just need to have prior authorization sent and call ph [phone] option3//Please advised patient via mychart

## 2023-06-03 NOTE — Telephone Encounter (Signed)
 Pharmacy Patient Advocate Encounter   Received notification from Physician's Office that prior authorization for Mounjaro 2.5MG /0.5ML auto-injectors is required/requested.   Insurance verification completed.   The patient is insured through [employer] .   Per test claim: PA required; PA submitted to above mentioned insurance via CoverMyMeds Key/confirmation #/EOC (Key: [id number])    Status is pending

## 2023-06-04 ENCOUNTER — Other Ambulatory Visit: Payer: BC Managed Care – PPO

## 2023-06-09 ENCOUNTER — Other Ambulatory Visit (HOSPITAL_COMMUNITY): Payer: Self-pay

## 2023-06-09 NOTE — Telephone Encounter (Signed)
 Pharmacy Patient Advocate Encounter  Received notification from [facility] that Prior Authorization for  Mounjaro  2.5MG /0.5ML auto-injectors  has been APPROVED from 5.21.25 to 5.21.26. Ran test claim, Copay is $RTS, AS THE RX HAS NOW BEEN FILLED. This test claim was processed through [facility]- copay amounts may vary at other pharmacies due to pharmacy/plan contracts, or as the patient moves through the different stages of their insurance plan.   PA #/Case ID/Reference #:  (Key: [id number])

## 2023-06-19 ENCOUNTER — Ambulatory Visit
Admission: RE | Admit: 2023-06-19 | Discharge: 2023-06-19 | Disposition: A | Discharge status: Home / Self Care | Source: Ambulatory Visit | Attending: Medical | Admitting: Medical

## 2023-06-19 ENCOUNTER — Ambulatory Visit: Payer: Self-pay | Admitting: Medical

## 2023-06-19 ENCOUNTER — Other Ambulatory Visit: Payer: Self-pay | Admitting: Medical

## 2023-06-19 DIAGNOSIS — N6320 Unspecified lump in the left breast, unspecified quadrant: Secondary | ICD-10-CM

## 2023-06-19 DIAGNOSIS — N6325 Unspecified lump in the left breast, overlapping quadrants: Secondary | ICD-10-CM | POA: Diagnosis not present

## 2023-06-19 NOTE — Progress Notes (Signed)
 Results sent through MyChart

## 2023-06-29 ENCOUNTER — Ambulatory Visit: Payer: BC Managed Care – PPO | Admitting: Family Medicine

## 2023-06-30 ENCOUNTER — Other Ambulatory Visit: Payer: Self-pay | Admitting: Medical

## 2023-06-30 ENCOUNTER — Ambulatory Visit: Payer: BC Managed Care – PPO | Admitting: Medical

## 2023-06-30 NOTE — Telephone Encounter (Signed)
  Pt was increased to 5mg  in May

## 2023-07-01 ENCOUNTER — Other Ambulatory Visit: Payer: Self-pay | Admitting: Medical

## 2023-07-01 MED ORDER — MOUNJARO 5 MG/0.5ML ~~LOC~~ SOAJ: 5.0000 mg | SUBCUTANEOUS | 0 refills | Status: DC

## 2023-07-01 NOTE — Telephone Encounter (Signed)
 Copied from CRM [id number]. Topic: Clinical - Medication Refill >> Jul 01, 2023  9:10 AM Crispin Dolphin wrote: Medication: tirzepatide  (MOUNJARO ) 5 MG/0.5ML Pen  Has the patient contacted their pharmacy? Yes (Agent: If no, request that the patient contact the pharmacy for the refill. If patient does not wish to contact the pharmacy document the reason why and proceed with request.) (Agent: If yes, when and what did the pharmacy advise?) told her to call provider - they sent 2.5 it was denied.   This is the patient's preferred pharmacy:  [facility] [address] [place] - [address] [place] [address] Phone: [phone] Fax: [phone]   Is this the correct pharmacy for this prescription? Yes If no, delete pharmacy and type the correct one.   Has the prescription been filled recently? Yes  Is the patient out of the medication? Yes next dose due Sunday   Has the patient been seen for an appointment in the last year OR does the patient have an upcoming appointment? Yes  Can we respond through MyChart? Yes  Agent: Please be advised that Rx refills may take up to 3 business days. We ask that you follow-up with your pharmacy.

## 2023-07-03 ENCOUNTER — Telehealth: Payer: Self-pay

## 2023-07-03 NOTE — Telephone Encounter (Signed)
 Do you have a PA for the pts. Mounjaro ?  Copied from CRM [id number]. Topic: Clinical - Prescription Issue >> Jul 03, 2023 10:40 AM Crispin Dolphin wrote: Reason for CRM: Patient called. States the price of her medication changed. States she went to get it and it was over $100 they told her to reach out to insurance and they told her it should be over $1000. Wanted to see if new PA is required for new dose. If so, can it be submitted. Patient states next dose do Sunday. She is not sure what to do about that or if samples are available. Thank You

## 2023-07-06 ENCOUNTER — Other Ambulatory Visit (HOSPITAL_COMMUNITY): Payer: Self-pay

## 2023-07-06 ENCOUNTER — Telehealth: Payer: Self-pay

## 2023-07-06 NOTE — Telephone Encounter (Signed)
    Follow up:       No addt p/a is needed as the Rx continues to go through for Mounjaro  5mg  but does have a different cost .

## 2023-07-09 ENCOUNTER — Telehealth: Payer: Self-pay

## 2023-07-09 DIAGNOSIS — E118 Type 2 diabetes mellitus with unspecified complications: Secondary | ICD-10-CM

## 2023-07-09 MED ORDER — ACCU-CHEK GUIDE TEST VI STRP: ORAL_STRIP | 3 refills | Status: AC

## 2023-07-09 MED ORDER — ACCU-CHEK GUIDE ME W/DEVICE KIT: PACK | 0 refills | Status: AC

## 2023-07-09 MED ORDER — ACCU-CHEK SOFTCLIX LANCETS MISC: 3 refills | Status: AC

## 2023-07-09 NOTE — Addendum Note (Signed)
 Addended by: LIONELL JON DEL on: 07/09/2023 03:55 PM   Modules accepted: Orders

## 2023-07-09 NOTE — Progress Notes (Addendum)
 07/09/2023 Name: Ariel Sweeney MRN: [id number] DOB: 1965-07-22  Chief Complaint  Patient presents with   Medication Management   Diabetes    Deklyn R Schachter is a 58 y.o. year old female who presented for a telephone visit.   They were referred to the pharmacist by a quality report for assistance in managing diabetes.    Subjective:  Care Team: Primary Care Provider: Bulah Alm GORMAN DEVONNA ; Next Scheduled Visit: 07/13/23  Medication Access/Adherence  Current Pharmacy:  [facility] [address], [place] - [address] [place] [address] Phone: [phone] Fax: [phone]  [id number] [place], [place] - [address] AT [facility] [address] [place] [address] Phone: [phone] Fax: [phone]  [place] [facility] [address] [place] [address] Phone: [phone] Fax: [phone]   Patient reports affordability concerns with their medications: Yes  - Mounjaro  Patient reports access/transportation concerns to their pharmacy: No  Patient reports adherence concerns with their medications:  No     Diabetes:  Current medications: Mounjaro  5mg  once weekly Medications tried in the past: Trulicity   Current glucose readings: Not checking at home, reports she has no meter   Current medication access support: Getting mounjaro  with copay card   Objective:  Lab Results  Component Value Date   HGBA1C 8.6 (H) 05/28/2023    Lab Results  Component Value Date   CREATININE 1.30 (H) 05/28/2023   BUN 14 05/28/2023   NA 138 05/28/2023   K 3.5 05/28/2023   CL 100 05/28/2023   CO2 20 05/28/2023    Lab Results  Component Value Date   CHOL 127 10/16/2022   HDL 47 10/16/2022   LDLCALC 50 10/16/2022   TRIG 186 (H) 10/16/2022   CHOLHDL 2.7 10/16/2022    Medications Reviewed Today     Reviewed by Lionell Jon DEL, RPH (Pharmacist) on 07/09/23 at 1526  Med List Status: <None>   Medication Order Taking? Sig Documenting Provider Last Dose Status Informant  amLODipine  (NORVASC ) 10 MG tablet [id number] No Take 1 tablet (10 mg total) by mouth daily. Tysinger, Alm GORMAN, PA-C Taking Active   aspirin  EC (EQ ASPIRIN  ADULT LOW DOSE) 81 MG tablet [id number] No Take 1 tablet (81 mg total) by mouth daily. Swallow whole. Tysinger, Alm GORMAN, PA-C Taking Active   fenofibrate  54 MG tablet [id number] No Take 1 tablet by mouth once daily Tysinger, Alm GORMAN, PA-C Taking Active   nitrofurantoin , macrocrystal-monohydrate, (MACROBID ) 100 MG capsule [id number]  Take 1 capsule (100 mg total) by mouth 2 (two) times daily. Tysinger, Alm GORMAN, PA-C  Active   rosuvastatin  (CRESTOR ) 10 MG tablet [id number] No Take 1 tablet (10 mg total) by mouth daily. Tysinger, Alm GORMAN, PA-C Taking Active   tirzepatide  (MOUNJARO ) 5 MG/0.5ML Pen [id number]  Inject 5 mg into the skin once a week. Joyce Norleen BROCKS, MD  Active   valsartan -hydrochlorothiazide  (DIOVAN -HCT) 160-12.5 MG tablet [id number]  Take 1 tablet by mouth daily. Tysinger, Alm GORMAN, PA-C  Active               Assessment/Plan:   Diabetes: - Currently uncontrolled - Reviewed long term cardiovascular and renal outcomes of uncontrolled blood sugar - Reviewed goal A1c, goal fasting, and goal 2 hour post prandial glucose - Recommend to check sugar once daily, sending in order for supplies  - Patient denies personal or family  history of multiple endocrine neoplasia type 2, medullary thyroid  cancer; personal history of pancreatitis or gallbladder disease. -Patient has a $3500 deductible with her insurance. Company savings card made medication $25 last month but has a max annual allowance of $1950, which is why price increased for this month. Patient confirms she can afford to stay on medication at this time. -If price continues to rise, may need to consider converting to ozempic  which has  its own copay card   Follow Up Plan: 1 month  Jon VEAR Lindau, PharmD Clinical Pharmacist [phone]

## 2023-07-10 ENCOUNTER — Other Ambulatory Visit: Payer: Self-pay | Admitting: Medical

## 2023-07-13 ENCOUNTER — Ambulatory Visit (INDEPENDENT_AMBULATORY_CARE_PROVIDER_SITE_OTHER): Admitting: Medical

## 2023-07-13 ENCOUNTER — Encounter: Payer: Self-pay | Admitting: Medical

## 2023-07-13 VITALS — BP 120/78 | HR 73 | Ht 62.0 in | Wt 194.2 lb

## 2023-07-13 DIAGNOSIS — Z7185 Encounter for immunization safety counseling: Secondary | ICD-10-CM

## 2023-07-13 DIAGNOSIS — Z0000 Encounter for general adult medical examination without abnormal findings: Secondary | ICD-10-CM

## 2023-07-13 DIAGNOSIS — E118 Type 2 diabetes mellitus with unspecified complications: Secondary | ICD-10-CM

## 2023-07-13 DIAGNOSIS — I10 Essential (primary) hypertension: Secondary | ICD-10-CM

## 2023-07-13 DIAGNOSIS — E785 Hyperlipidemia, unspecified: Secondary | ICD-10-CM

## 2023-07-13 DIAGNOSIS — N1831 Chronic kidney disease, stage 3a: Secondary | ICD-10-CM

## 2023-07-13 DIAGNOSIS — Z87891 Personal history of nicotine dependence: Secondary | ICD-10-CM

## 2023-07-13 DIAGNOSIS — E049 Nontoxic goiter, unspecified: Secondary | ICD-10-CM

## 2023-07-13 DIAGNOSIS — M79601 Pain in right arm: Secondary | ICD-10-CM

## 2023-07-13 LAB — LIPID PANEL

## 2023-07-13 NOTE — Patient Instructions (Signed)
 This visit was a preventative care visit, also known as wellness visit or routine physical.   Topics typically include healthy lifestyle, diet, exercise, preventative care, vaccinations, sick and well care, proper use of emergency dept and after hours care, as well as other concerns.     Recommendations: Continue to return yearly for your annual wellness and preventative care visits.  This gives us  a chance to discuss healthy lifestyle, exercise, vaccinations, review your chart record, and perform screenings where appropriate.  I recommend you see your eye doctor yearly for routine vision care.  We will call to request most recent ophthalmology exam  I recommend you see your dentist yearly for routine dental care including hygiene visits twice yearly.   Vaccination recommendations were reviewed Immunization History  Administered Date(s) Administered   PFIZER(Purple Top)SARS-COV-2 Vaccination 11/14/2019, 12/05/2019   PPD Test 10/05/2020   Pneumococcal Polysaccharide-23 04/17/2014, 05/30/2020   Tdap 06/06/2015   Counseled on vaccines.  Recommended yearly flu shot, shingles vaccine, Prevnar 20.  She will consider.   Screening for cancer: Colon cancer screening: I reviewed your January 2025 colonoscopy report.  Due repeat in 5 years.  Breast cancer screening: You should perform a self breast exam monthly.   We reviewed recommendations for regular mammograms and breast cancer screening.  Cervical cancer screening: We reviewed recommendations for pap smear screening.  May 2025 Pap smear reviewed.   Skin cancer screening: Check your skin regularly for new changes, growing lesions, or other lesions of concern Come in for evaluation if you have skin lesions of concern.  Lung cancer screening: If you have a greater than 20 pack year history of tobacco use, then you may qualify for lung cancer screening with a chest CT scan.   Please call your insurance company to inquire about coverage  for this test.  We currently don't have screenings for other cancers besides breast, cervical, colon, and lung cancers.  If you have a strong family history of cancer or have other cancer screening concerns, please let me know.    Bone health: Get at least 150 minutes of aerobic exercise weekly Get weight bearing exercise at least once weekly Bone density test:  A bone density test is an imaging test that uses a type of X-ray to measure the amount of calcium  and other minerals in your bones. The test may be used to diagnose or screen you for a condition that causes weak or thin bones (osteoporosis), predict your risk for a broken bone (fracture), or determine how well your osteoporosis treatment is working. The bone density test is recommended for females 65 and older, or females or males <65 if certain risk factors such as thyroid  disease, long term use of steroids such as for asthma or rheumatological issues, vitamin D  deficiency, estrogen deficiency, family history of osteoporosis, self or family history of fragility fracture in first degree relative.    Heart health: Get at least 150 minutes of aerobic exercise weekly Limit alcohol It is important to maintain a healthy blood pressure and healthy cholesterol numbers  Heart disease screening: Screening for heart disease includes screening for blood pressure, fasting lipids, glucose/diabetes screening, BMI height to weight ratio, reviewed of smoking status, physical activity, and diet.    Goals include blood pressure 120/80 or less, maintaining a healthy lipid/cholesterol profile, preventing diabetes or keeping diabetes numbers under good control, not smoking or using tobacco products, exercising most days per week or at least 150 minutes per week of exercise, and eating healthy variety  of fruits and vegetables, healthy oils, and avoiding unhealthy food choices like fried food, fast food, high sugar and high cholesterol foods.    Other  tests may possibly include EKG test, CT coronary calcium  score, echocardiogram, exercise treadmill stress test.   CT coronary from October 2024 showed coronary calcium  score 64.4.   Medical care options: I recommend you continue to seek care here first for routine care.  We try really hard to have available appointments Monday through Friday daytime hours for sick visits, acute visits, and physicals.  Urgent care should be used for after hours and weekends for significant issues that cannot wait till the next day.  The emergency department should be used for significant potentially life-threatening emergencies.  The emergency department is expensive, can often have long wait times for less significant concerns, so try to utilize primary care, urgent care, or telemedicine when possible to avoid unnecessary trips to the emergency department.  Virtual visits and telemedicine have been introduced since the pandemic started in 2020, and can be convenient ways to receive medical care.  We offer virtual appointments as well to assist you in a variety of options to seek medical care.   Advanced Directives: I recommend you consider completing a Health Care Power of Attorney and Living Will.   These documents respect your wishes and help alleviate burdens on your loved ones if you were to become terminally ill or be in a position to need those documents enforced.    You can complete Advanced Directives yourself, have them notarized, then have copies made for our office, for you and for anybody you feel should have them in safe keeping.  Or, you can have an attorney prepare these documents.   If you haven't updated your Last Will and Testament in a while, it may be worthwhile having an attorney prepare these documents together and save on some costs.        Other specific issues: Hypertension-continue current medications, continue amlodipine  10 mg daily, Valsartan  HCT 160/12.5mg .   Labs  today  hyperlipidemia-continue current medication, Fenofibrate  54mg  daily , Crestor  10 mg daily, aspirin  81 mg daily , labs today  Diabetes-she began Moujaro 2.5mg  a month ago  tolerating just fine.  Currently just started 5mg  dose.  In 1 month we will plan to increase to Mounjaro  7.5mg  weekly.  Former smoker, quit within past yewar  Goiter - consider updated imaging  Right arm pain-her exam suggest that she may have had a partial biceps tear in the past 6 months.  There is some asymmetry from left upper arm compared to the right upper arm.  She is tender over the biceps origin as well.  I recommended she get an appointment back with her orthopedist Dr. Jerri

## 2023-07-13 NOTE — Progress Notes (Signed)
 Subjective:   HPI  Ariel Sweeney is a 58 y.o. female who presents for Chief Complaint  Patient presents with   Annual Exam    Fasting cpe, had shoulder surgery in December but the last couple weeks, had sharp pains and wants a referral back to ortho- Ozell Cummins.  Pap was done here in 05/2023. No update in medical or surgery hx. No other issues. Requested eye exam from [facility]    Patient Care Team: Lyniah Fujita, Alm RAMAN, PA-C as PCP - General (Family Medicine) Lionell, Jon DEL, [facility] (Pharmacist) [facility], [place] (Optometry) Sees dentist Sees eye doctor Dr. Gustav Mcgee, GI Dr. Kay Cummins, ortho [facility] Me[facility], [facility]   Concerns: Here for well visit.  Doing fine overall.  I saw her in May for abdominal pain.  That pain is mostly improved but she does get a twinge of pain every now and then.  She was also positive for urinary tract infection at that time and finished antibiotic.  She has a history of right shoulder surgery December 2024.  Every now and then that she gets pain in the front of her shoulder.  No recent heavy lifting but she does note a fall a few months ago at her apartment.  There was some things in the way on a walk when she tripped falling forward onto her hands and arms.  She does not recall specifically having pain in her shoulder at that time but she certainly has gotten intermittent pains in the right shoulder/right arm  She is compliant with her cholesterol and blood pressure medications  Gynecological history: 2 pregnancies, 1 live births, 1 miscarriage   Allergies  Allergen Reactions   Influenza Vaccines     Claims illness from flu shot    Past Medical History:  Diagnosis Date   Decreased pulse    normal ABIs 06/2016   Diabetes mellitus without complication (HCC)    Full dentures 08/2018   GERD (gastroesophageal reflux disease)    intermittent   Goiter    Hyperlipidemia    Hypertension    Obesity    Tobacco use    Vitamin D   deficiency    Wears contact lenses     Current Outpatient Medications on File Prior to Visit  Medication Sig Dispense Refill   Accu-Chek Softclix Lancets lancets Use to check blood sugar once daily. Dx: E11.8 May substitute to any brand preferred by insurance 100 each 3   amLODipine  (NORVASC ) 10 MG tablet Take 1 tablet (10 mg total) by mouth daily. 90 tablet 3   aspirin  EC (EQ ASPIRIN  ADULT LOW DOSE) 81 MG tablet Take 1 tablet (81 mg total) by mouth daily. Swallow whole. 90 tablet 3   fenofibrate  54 MG tablet Take 1 tablet by mouth once daily 90 tablet 1   rosuvastatin  (CRESTOR ) 10 MG tablet Take 1 tablet by mouth once daily 90 tablet 0   tirzepatide  (MOUNJARO ) 5 MG/0.5ML Pen Inject 5 mg into the skin once a week. 2 mL 0   valsartan -hydrochlorothiazide  (DIOVAN -HCT) 160-12.5 MG tablet Take 1 tablet by mouth daily. 90 tablet 1   Blood Glucose Monitoring Suppl (ACCU-CHEK GUIDE ME) w/Device KIT Use to check blood sugar once daily. Dx: E11.8 May substitute to any brand preferred by insurance 1 kit 0   glucose blood (ACCU-CHEK GUIDE TEST) test strip Use to check blood sugar once daily. Dx: E11.8 May substitute to any brand preferred by insurance 100 each 3   nitrofurantoin , macrocrystal-monohydrate, (MACROBID ) 100 MG  capsule Take 1 capsule (100 mg total) by mouth 2 (two) times daily. 14 capsule 0   No current facility-administered medications on file prior to visit.      Current Outpatient Medications:    Accu-Chek Softclix Lancets lancets, Use to check blood sugar once daily. Dx: E11.8 May substitute to any brand preferred by insurance, Disp: 100 each, Rfl: 3   amLODipine  (NORVASC ) 10 MG tablet, Take 1 tablet (10 mg total) by mouth daily., Disp: 90 tablet, Rfl: 3   aspirin  EC (EQ ASPIRIN  ADULT LOW DOSE) 81 MG tablet, Take 1 tablet (81 mg total) by mouth daily. Swallow whole., Disp: 90 tablet, Rfl: 3   fenofibrate  54 MG tablet, Take 1 tablet by mouth once daily, Disp: 90 tablet, Rfl: 1    rosuvastatin  (CRESTOR ) 10 MG tablet, Take 1 tablet by mouth once daily, Disp: 90 tablet, Rfl: 0   tirzepatide  (MOUNJARO ) 5 MG/0.5ML Pen, Inject 5 mg into the skin once a week., Disp: 2 mL, Rfl: 0   valsartan -hydrochlorothiazide  (DIOVAN -HCT) 160-12.5 MG tablet, Take 1 tablet by mouth daily., Disp: 90 tablet, Rfl: 1   Blood Glucose Monitoring Suppl (ACCU-CHEK GUIDE ME) w/Device KIT, Use to check blood sugar once daily. Dx: E11.8 May substitute to any brand preferred by insurance, Disp: 1 kit, Rfl: 0   glucose blood (ACCU-CHEK GUIDE TEST) test strip, Use to check blood sugar once daily. Dx: E11.8 May substitute to any brand preferred by insurance, Disp: 100 each, Rfl: 3   nitrofurantoin , macrocrystal-monohydrate, (MACROBID ) 100 MG capsule, Take 1 capsule (100 mg total) by mouth 2 (two) times daily., Disp: 14 capsule, Rfl: 0  Family History  Problem Relation Age of Onset   Diabetes Mother    Hypertension Mother    Kidney disease Mother    Anemia Mother    Hypertension Father    Diabetes Father    Heart disease Father    Hypertension Sister    Stroke Maternal Aunt    Cancer Neg Hx    Colon cancer Neg Hx    Colon polyps Neg Hx    Esophageal cancer Neg Hx    Rectal cancer Neg Hx    Stomach cancer Neg Hx     Past Surgical History:  Procedure Laterality Date   BREAST CYST ASPIRATION Right 2017   cyst    CHOLECYSTECTOMY     COLONOSCOPY  09/2015   tubular adenoma, repeat 5 years;  Dr. Gustav Mcgee   COLPOSCOPY     history of abnormal pap in remote past   ROTATOR CUFF REPAIR Right    12/24   STRABISMUS SURGERY     childhood    Reviewed their medical, surgical, family, social, medication, and allergy history and updated chart as appropriate.   Review of Systems  Constitutional:  Negative for chills, fever, malaise/fatigue and weight loss.  HENT:  Negative for congestion, ear pain, hearing loss, sore throat and tinnitus.   Eyes:  Negative for blurred vision, pain and redness.   Respiratory:  Negative for cough, hemoptysis and shortness of breath.   Cardiovascular:  Negative for chest pain, palpitations, orthopnea, claudication and leg swelling.  Gastrointestinal:  Negative for abdominal pain, blood in stool, constipation, diarrhea, nausea and vomiting.  Genitourinary:  Negative for dysuria, flank pain, frequency, hematuria and urgency.  Musculoskeletal:  Positive for joint pain. Negative for falls and myalgias.  Skin:  Negative for itching and rash.  Neurological:  Negative for dizziness, tingling, speech change, weakness and headaches.  Endo/Heme/Allergies:  Negative  for polydipsia. Does not bruise/bleed easily.  Psychiatric/Behavioral:  Negative for depression and memory loss. The patient is not nervous/anxious and does not have insomnia.        Objective:  BP 120/78   Pulse 73   Ht 5' 2 (1.575 m)   Wt 194 lb 3.2 oz (88.1 kg)   SpO2 96%   BMI 35.52 kg/m   Wt Readings from Last 3 Encounters:  07/13/23 194 lb 3.2 oz (88.1 kg)  05/28/23 195 lb 6.4 oz (88.6 kg)  02/04/23 185 lb (83.9 kg)    General appearance: alert, no distress, WD/WN, African American female Skin: Keloid scars on right deltoid mid upper chest, no worrisome lesion HEENT: normocephalic, conjunctiva/corneas normal, sclerae anicteric, exophthalmos noted  PERRLA, EOMi Neck: supple, no lymphadenopathy, firmness of thyroid  in general, no distinct nodules, no masses, normal ROM, no bruits Chest: non tender, normal shape and expansion Heart: RRR, normal S1, S2, no murmurs Lungs: CTA bilaterally, no wheezes, rhonchi, or rales Abdomen: Port surgical scars noted of the abdomen, +bs, soft, non tender, non distended, no masses, no hepatomegaly, no splenomegaly, no bruits Back: non tender, normal ROM, no scoliosis Musculoskeletal: Mild tenderness over the biceps origin, there seems to be some asymmetry of the right biceps compared to the left suggesting possible partial biceps rupture but nontender  mid to lower upper arm, otherwise shoulder nontender with relatively normal range of motion. upper extremities non tender, no obvious deformity, normal ROM throughout, lower extremities non tender, no obvious deformity, normal ROM throughout Extremities: no edema, no cyanosis, no clubbing Pulses: 2+ symmetric, upper and lower extremities, normal cap refill Neurological: alert, oriented x 3, CN2-12 intact, strength normal upper extremities and lower extremities, sensation normal throughout, DTRs 2+ throughout, no cerebellar signs, gait normal Psychiatric: normal affect, behavior normal, pleasant  Breast/pelvic  - declined    Assessment and Plan :   Encounter Diagnoses  Name Primary?   Routine general medical examination at a health care facility Yes   Vaccine counseling    CKD stage 3a, GFR 45-59 ml/min (HCC)    Diabetes mellitus type 2 with complications (HCC)    Essential hypertension    Goiter    Hyperlipidemia, unspecified hyperlipidemia type    Former smoker     This visit was a preventative care visit, also known as wellness visit or routine physical.   Topics typically include healthy lifestyle, diet, exercise, preventative care, vaccinations, sick and well care, proper use of emergency dept and after hours care, as well as other concerns.     Recommendations: Continue to return yearly for your annual wellness and preventative care visits.  This gives us  a chance to discuss healthy lifestyle, exercise, vaccinations, review your chart record, and perform screenings where appropriate.  I recommend you see your eye doctor yearly for routine vision care.  We will call to request most recent ophthalmology exam  I recommend you see your dentist yearly for routine dental care including hygiene visits twice yearly.   Vaccination recommendations were reviewed Immunization History  Administered Date(s) Administered   PFIZER(Purple Top)SARS-COV-2 Vaccination 11/14/2019, 12/05/2019    PPD Test 10/05/2020   Pneumococcal Polysaccharide-23 04/17/2014, 05/30/2020   Tdap 06/06/2015   Counseled on vaccines.  Recommended yearly flu shot, shingles vaccine, Prevnar 20.  She will consider.   Screening for cancer: Colon cancer screening: I reviewed your January 2025 colonoscopy report.  Due repeat in 5 years.  Breast cancer screening: You should perform a self breast exam monthly.  We reviewed recommendations for regular mammograms and breast cancer screening.  Cervical cancer screening: We reviewed recommendations for pap smear screening.  May 2025 Pap smear reviewed.   Skin cancer screening: Check your skin regularly for new changes, growing lesions, or other lesions of concern Come in for evaluation if you have skin lesions of concern.  Lung cancer screening: If you have a greater than 20 pack year history of tobacco use, then you may qualify for lung cancer screening with a chest CT scan.   Please call your insurance company to inquire about coverage for this test.  We currently don't have screenings for other cancers besides breast, cervical, colon, and lung cancers.  If you have a strong family history of cancer or have other cancer screening concerns, please let me know.    Bone health: Get at least 150 minutes of aerobic exercise weekly Get weight bearing exercise at least once weekly Bone density test:  A bone density test is an imaging test that uses a type of X-ray to measure the amount of calcium  and other minerals in your bones. The test may be used to diagnose or screen you for a condition that causes weak or thin bones (osteoporosis), predict your risk for a broken bone (fracture), or determine how well your osteoporosis treatment is working. The bone density test is recommended for females 65 and older, or females or males <65 if certain risk factors such as thyroid  disease, long term use of steroids such as for asthma or rheumatological issues, vitamin D   deficiency, estrogen deficiency, family history of osteoporosis, self or family history of fragility fracture in first degree relative.    Heart health: Get at least 150 minutes of aerobic exercise weekly Limit alcohol It is important to maintain a healthy blood pressure and healthy cholesterol numbers  Heart disease screening: Screening for heart disease includes screening for blood pressure, fasting lipids, glucose/diabetes screening, BMI height to weight ratio, reviewed of smoking status, physical activity, and diet.    Goals include blood pressure 120/80 or less, maintaining a healthy lipid/cholesterol profile, preventing diabetes or keeping diabetes numbers under good control, not smoking or using tobacco products, exercising most days per week or at least 150 minutes per week of exercise, and eating healthy variety of fruits and vegetables, healthy oils, and avoiding unhealthy food choices like fried food, fast food, high sugar and high cholesterol foods.    Other tests may possibly include EKG test, CT coronary calcium  score, echocardiogram, exercise treadmill stress test.   CT coronary from October 2024 showed coronary calcium  score 64.4.   Medical care options: I recommend you continue to seek care here first for routine care.  We try really hard to have available appointments Monday through Friday daytime hours for sick visits, acute visits, and physicals.  Urgent care should be used for after hours and weekends for significant issues that cannot wait till the next day.  The emergency department should be used for significant potentially life-threatening emergencies.  The emergency department is expensive, can often have long wait times for less significant concerns, so try to utilize primary care, urgent care, or telemedicine when possible to avoid unnecessary trips to the emergency department.  Virtual visits and telemedicine have been introduced since the pandemic started in 2020,  and can be convenient ways to receive medical care.  We offer virtual appointments as well to assist you in a variety of options to seek medical care.   Advanced Directives: I recommend you  consider completing a Health Care Power of Attorney and Living Will.   These documents respect your wishes and help alleviate burdens on your loved ones if you were to become terminally ill or be in a position to need those documents enforced.    You can complete Advanced Directives yourself, have them notarized, then have copies made for our office, for you and for anybody you feel should have them in safe keeping.  Or, you can have an attorney prepare these documents.   If you haven't updated your Last Will and Testament in a while, it may be worthwhile having an attorney prepare these documents together and save on some costs.        Other specific issues: Hypertension-continue current medications, continue amlodipine  10 mg daily, Valsartan  HCT 160/12.5mg .   Labs today.  No family or personal history of thyroid  cancer  hyperlipidemia-continue current medication, Fenofibrate  54mg  daily , Crestor  10 mg daily, aspirin  81 mg daily , labs today  Diabetes-she began Moujaro 2.5mg  a month ago  tolerating just fine.  Currently just started 5mg  dose.  In 1 month we will plan to increase to Mounjaro  7.5mg  weekly.  Former smoker, quit within past yewar  Goiter - consider updated imaging  Right arm pain-her exam suggest that she may have had a partial biceps tear in the past 6 months.  There is some asymmetry from left upper arm compared to the right upper arm.  She is tender over the biceps origin as well.  I recommended she get an appointment back with her orthopedist Dr. Jerri Milling was seen today for annual exam.  Diagnoses and all orders for this visit:  Routine general medical examination at a health care facility -     Lipid panel -     CBC -     T4 AND TSH -     Hepatic function panel -      Urinalysis, Routine w reflex microscopic  Vaccine counseling  CKD stage 3a, GFR 45-59 ml/min (HCC) -     Urinalysis, Routine w reflex microscopic  Diabetes mellitus type 2 with complications (HCC)  Essential hypertension  Goiter -     T4 AND TSH  Hyperlipidemia, unspecified hyperlipidemia type -     Lipid panel  Former smoker   Follow-up pending labs, yearly for physical

## 2023-07-14 ENCOUNTER — Other Ambulatory Visit: Payer: Self-pay | Admitting: Medical

## 2023-07-14 ENCOUNTER — Ambulatory Visit: Payer: Self-pay | Admitting: Medical

## 2023-07-14 LAB — HEPATIC FUNCTION PANEL
ALT: 22 IU/L (ref 0–32)
AST: 21 IU/L (ref 0–40)
Albumin: 4.6 g/dL (ref 3.8–4.9)
Alkaline Phosphatase: 64 IU/L (ref 44–121)
Bilirubin Total: 0.3 mg/dL (ref 0.0–1.2)
Bilirubin, Direct: 0.11 mg/dL (ref 0.00–0.40)
Total Protein: 6.9 g/dL (ref 6.0–8.5)

## 2023-07-14 LAB — URINALYSIS, ROUTINE W REFLEX MICROSCOPIC
Bilirubin, UA: NEGATIVE
Glucose, UA: NEGATIVE
Ketones, UA: NEGATIVE
Leukocytes,UA: NEGATIVE
Nitrite, UA: NEGATIVE
RBC, UA: NEGATIVE
Specific Gravity, UA: 1.016 (ref 1.005–1.030)
Urobilinogen, Ur: 0.2 mg/dL (ref 0.2–1.0)
pH, UA: 7 (ref 5.0–7.5)

## 2023-07-14 LAB — CBC
Hematocrit: 43.9 % (ref 34.0–46.6)
Hemoglobin: 13.8 g/dL (ref 11.1–15.9)
MCH: 28.7 pg (ref 26.6–33.0)
MCHC: 31.4 g/dL — ABNORMAL LOW (ref 31.5–35.7)
MCV: 91 fL (ref 79–97)
Platelets: 264 10*3/uL (ref 150–450)
RBC: 4.81 x10E6/uL (ref 3.77–5.28)
RDW: 13.1 % (ref 11.7–15.4)
WBC: 4.3 10*3/uL (ref 3.4–10.8)

## 2023-07-14 LAB — LIPID PANEL
Cholesterol, Total: 139 mg/dL (ref 100–199)
HDL: 47 mg/dL (ref >39)
LDL CALC COMMENT:: 3 ratio (ref 0.0–4.4)
LDL Chol Calc (NIH): 54 mg/dL (ref 0–99)
Triglycerides: 241 mg/dL — AB (ref 0–149)
VLDL Cholesterol Cal: 38 mg/dL (ref 5–40)

## 2023-07-14 LAB — T4 AND TSH
T4, Total: 7.1 ug/dL (ref 4.5–12.0)
TSH: 0.983 u[IU]/mL (ref 0.450–4.500)

## 2023-07-14 LAB — MICROSCOPIC EXAMINATION: Casts: NONE SEEN /LPF

## 2023-07-14 MED ORDER — TIRZEPATIDE 7.5 MG/0.5ML ~~LOC~~ SOAJ
7.5000 mg | SUBCUTANEOUS | 0 refills | Status: DC
Start: 2023-07-14 — End: 2023-10-15

## 2023-07-14 MED ORDER — TIRZEPATIDE 10 MG/0.5ML ~~LOC~~ SOAJ: 10.0000 mg | SUBCUTANEOUS | 0 refills | Status: DC

## 2023-07-14 MED ORDER — FENOFIBRATE 145 MG PO TABS: 145.0000 mg | ORAL_TABLET | Freq: Every day | ORAL | 2 refills | Status: DC

## 2023-07-14 NOTE — Progress Notes (Signed)
 Send copy of my note yesterday to her orthopedic office, Dr. Jerri. Circle the part about right arm pain in the plan  Results sent through MyChart

## 2023-07-20 ENCOUNTER — Other Ambulatory Visit (HOSPITAL_COMMUNITY): Payer: Self-pay

## 2023-07-20 ENCOUNTER — Telehealth: Payer: Self-pay

## 2023-07-20 ENCOUNTER — Other Ambulatory Visit: Payer: Self-pay | Admitting: Medical

## 2023-07-20 MED ORDER — FENOFIBRATE 134 MG PO CAPS: 134.0000 mg | ORAL_CAPSULE | Freq: Every day | ORAL | 1 refills | Status: DC

## 2023-07-20 NOTE — Progress Notes (Signed)
   07/20/2023  Patient ID: Ariel Sweeney, female   DOB: February 19, 1965, 58 y.o.   MRN: [id number]  Contacted patient follow up on Mounjaro  dose increase to 7.5mg  from most recent PCP visit.  Patient is still taking 5mg , has only given herself 2 doses as of yesterday.  Did report that Mounjaro  7.5mg  is $275 and she does not know if she will be able to afford that. Contacted pharmacy and confirmed the price. Price is for a 84 day supply and does not change or decrease for a 28 day supply.  Made patient aware and she will think that over and decide if she is able to pick that up before her dose increase is due (on 08/09/23).  Scheduled follow up for 08/06/23 to discuss further or if alternative is needed at that time.  Jon VEAR Lindau, PharmD Clinical Pharmacist [phone]

## 2023-07-20 NOTE — Telephone Encounter (Signed)
 Pharmacy Patient Advocate Encounter  Insurance verification completed.   The patient is insured through [employer] test claim for Fenofibrate  134MG  Capsule. Currently a quantity of 90 is a 90 day supply and the co-pay is 0.00 . No P/A is required.       This test claim was processed through [facility]- copay amounts may vary at other pharmacies due to pharmacy/plan contracts, or as the patient moves through the different stages of their insurance plan.

## 2023-07-20 NOTE — Telephone Encounter (Signed)
 Please note that the Fenofibrate  134 and the cost is 0.00 with No Prior Authorization needed. Needs to be switched from 145mg  to 134mg  for insurance to cover this

## 2023-07-20 NOTE — Telephone Encounter (Signed)
 Pharmacy Patient Advocate Encounter   Received notification from Patient Pharmacy that prior authorization for FenoFibrate  145mg  tab  is required/requested.   Insurance verification completed.   The patient is insured through [facility] .   Per test claim:  Fenofibrate  134 tabs or Caps  is preferred by the insurance.  If suggested medication is appropriate, Please send in a new RX and discontinue this one. If not, please advise as to why it's not appropriate so that we may request a Prior Authorization. Please note, some preferred medications may still require a PA.  If the suggested medications have not been trialed and there are no contraindications to their use, the PA will not be submitted, as it will not be approved.  Please note that the Fenofibrate  134 and the cost is 0.00 with No Prior Authorization needed.

## 2023-08-03 ENCOUNTER — Telehealth: Payer: Self-pay

## 2023-08-03 ENCOUNTER — Ambulatory Visit (INDEPENDENT_AMBULATORY_CARE_PROVIDER_SITE_OTHER): Admitting: Medical

## 2023-08-03 ENCOUNTER — Ambulatory Visit: Payer: Self-pay

## 2023-08-03 VITALS — BP 110/70 | HR 71 | Temp 97.0°F | Wt 189.2 lb

## 2023-08-03 DIAGNOSIS — M549 Dorsalgia, unspecified: Secondary | ICD-10-CM | POA: Diagnosis not present

## 2023-08-03 DIAGNOSIS — R0789 Other chest pain: Secondary | ICD-10-CM | POA: Diagnosis not present

## 2023-08-03 DIAGNOSIS — L729 Follicular cyst of the skin and subcutaneous tissue, unspecified: Secondary | ICD-10-CM

## 2023-08-03 LAB — POCT URINALYSIS DIP (PROADVANTAGE DEVICE)
Bilirubin, UA: NEGATIVE
Blood, UA: NEGATIVE
Glucose, UA: NEGATIVE mg/dL
Ketones, POC UA: NEGATIVE mg/dL
Leukocytes, UA: NEGATIVE
Nitrite, UA: NEGATIVE
Protein Ur, POC: 30 mg/dL — AB
Specific Gravity, Urine: 1.01
Urobilinogen, Ur: NEGATIVE
pH, UA: 7

## 2023-08-03 MED ORDER — METHOCARBAMOL 750 MG PO TABS: 750.0000 mg | ORAL_TABLET | Freq: Two times a day (BID) | ORAL | 0 refills | Status: DC | PRN

## 2023-08-03 MED ORDER — OXYCODONE-ACETAMINOPHEN 5-325 MG PO TABS: 1.0000 | ORAL_TABLET | ORAL | 0 refills | Status: DC | PRN

## 2023-08-03 MED ORDER — SULFAMETHOXAZOLE-TRIMETHOPRIM 800-160 MG PO TABS: 1.0000 | ORAL_TABLET | Freq: Two times a day (BID) | ORAL | 0 refills | Status: DC

## 2023-08-03 NOTE — Telephone Encounter (Signed)
 Pt scheduled for today.

## 2023-08-03 NOTE — Addendum Note (Signed)
 Addended by: BULAH ALM RAMAN on: 08/03/2023 01:42 PM   Modules accepted: Orders

## 2023-08-03 NOTE — Progress Notes (Signed)
 Subjective:  Ariel Sweeney is a 58 y.o. female who presents for Chief Complaint  Patient presents with   Acute Visit    Left sided pain, pain is upper back behind bra area. Hurts with movement, started last night     Here today for left-sided pain.  Started last night before she went to bed.  She denies any strenuous activity or specific injury or trauma or fall.  It hurt through the night and still hurts this morning.  It is in her left upper back.  It hurts with movement.  She denies any bowel or bladder changes.  No constipation, diarrhea, nausea, vomiting.  No urinary frequency or urgency, no blood in the urine.  No fever.  No shortness of breath or chest pain.  No rash.  Her daughter was concerned about kidney stone.  She denies hx/o kidney stone.  She also has cyst from the left shoulder that has been there for several years but in recent weeks its gotten bigger at times.  She squeezes it, and gets cheesy stuff out of it every now and then.  It was warm today.  No other aggravating or relieving factors.    No other c/o.  The following portions of the patient's history were reviewed and updated as appropriate: allergies, current medications, past family history, past medical history, past social history, past surgical history and problem list.  ROS Otherwise as in subjective above  Past Medical History:  Diagnosis Date   Decreased pulse    normal ABIs 06/2016   Diabetes mellitus without complication (HCC)    Full dentures 08/2018   GERD (gastroesophageal reflux disease)    intermittent   Goiter    Hyperlipidemia    Hypertension    Obesity    Tobacco use    Vitamin D  deficiency    Wears contact lenses    Current Outpatient Medications on File Prior to Visit  Medication Sig Dispense Refill   amLODipine  (NORVASC ) 10 MG tablet Take 1 tablet (10 mg total) by mouth daily. 90 tablet 3   aspirin  EC (EQ ASPIRIN  ADULT LOW DOSE) 81 MG tablet Take 1 tablet (81 mg total) by mouth  daily. Swallow whole. 90 tablet 3   fenofibrate  micronized (LOFIBRA) 134 MG capsule Take 1 capsule (134 mg total) by mouth daily before breakfast. 90 capsule 1   rosuvastatin  (CRESTOR ) 10 MG tablet Take 1 tablet by mouth once daily 90 tablet 0   tirzepatide  (MOUNJARO ) 5 MG/0.5ML Pen Inject 5 mg into the skin once a week. 2 mL 0   valsartan -hydrochlorothiazide  (DIOVAN -HCT) 160-12.5 MG tablet Take 1 tablet by mouth daily. 90 tablet 1   Accu-Chek Softclix Lancets lancets Use to check blood sugar once daily. Dx: E11.8 May substitute to any brand preferred by insurance 100 each 3   Blood Glucose Monitoring Suppl (ACCU-CHEK GUIDE ME) w/Device KIT Use to check blood sugar once daily. Dx: E11.8 May substitute to any brand preferred by insurance 1 kit 0   glucose blood (ACCU-CHEK GUIDE TEST) test strip Use to check blood sugar once daily. Dx: E11.8 May substitute to any brand preferred by insurance 100 each 3   tirzepatide  (MOUNJARO ) 10 MG/0.5ML Pen Inject 10 mg into the skin once a week. 6 mL 0   tirzepatide  (MOUNJARO ) 7.5 MG/0.5ML Pen Inject 7.5 mg into the skin once a week. 6 mL 0   No current facility-administered medications on file prior to visit.     Objective: BP 110/70   Pulse  71   Temp (!) 97 F (36.1 C)   Wt 189 lb 3.2 oz (85.8 kg)   SpO2 98%   BMI 34.61 kg/m   General appearance: alert, well developed, well nourished, seems to be in pain at times Heart: RRR, normal S1, S2, no murmurs Lungs: CTA bilaterally, no wheezes, rhonchi, or rales Abdomen: +bs, soft, non tender, non distended, no masses, no hepatomegaly, no splenomegaly She is tender in the specific area of her left mid back approximately around T11-12 region, she does seem to have some pain in that area with twisting or going up or down on the exam table from supine to sitting Pulses: 2+ radial pulses, 2+ pedal pulses, normal cap refill Ext: no edema Left lateral deltoid with a 4 cm diameter and adjacent 3 cm diameter  subcutaneous mass suggestive of sebaceous cyst, no induration fluctuance or warmth    Assessment: Encounter Diagnoses  Name Primary?   Upper back pain on left side Yes   Chest wall pain    Cyst of skin      Plan: Your symptoms today suggesting muscle strain in the mid back on the left  Recommendations: You can use over-the-counter pain medicine or you can use the Percocet pain medicine that I prescribed today for the worst pain.  Caution with sedation Taking low-dose muscle relaxer you had leftover at home once or twice daily for the next day or 2 for spasm of the muscle Use some gentle stretching and range of motion activity of the next few days If worse in the next 48 hours particular with urinary changes, nausea, fever, pinkish urine or blood in the urine then recheck right away  Cyst of skin I will refer you to a specialist for excision In the meantime if redness, worse pain or swelling of the skin area then start the antibiotic Bactrim  and get reevaluated   [name] was seen today for acute visit.  Diagnoses and all orders for this visit:  Upper back pain on left side -     POCT Urinalysis DIP (Proadvantage Device)  Chest wall pain  Cyst of skin -     Ambulatory referral to Dermatology  Other orders -     oxyCODONE -acetaminophen  (PERCOCET) 5-325 MG tablet; Take 1 tablet by mouth every 4 (four) hours as needed for severe pain (pain score 7-10). -     sulfamethoxazole -trimethoprim  (BACTRIM  DS) 800-160 MG tablet; Take 1 tablet by mouth 2 (two) times daily.    Follow up: call back within 3 days

## 2023-08-03 NOTE — Telephone Encounter (Signed)
 FYI Only or Action Required?: FYI only for provider.  Patient was last seen in primary care on 07/13/2023 by Bulah Alm RAMAN, PA-C.  Called Nurse Triage reporting Abdominal Pain.  Symptoms began yesterday.  Interventions attempted: OTC medications: tylenol  without relief and Rest, hydration, or home remedies.  Symptoms are: unchanged.  Triage Disposition: See HCP Within 4 Hours (Or PCP Triage)  Patient/caregiver understands and will follow disposition?: Yes     Copied from CRM [id number]. Topic: Clinical - Red Word Triage >> Aug 03, 2023  8:16 AM Carlatta H wrote: [facility] that prompted transfer to Nurse Triage: Patient is in severe pain in left side of stomach//Pain since late last night// Reason for Disposition  [1] MILD-MODERATE pain AND [2] constant AND [3] present > 2 hours  Answer Assessment - Initial Assessment Questions 1. LOCATION: Where does it hurt?      L side towards back 2. RADIATION: Does the pain shoot anywhere else? (e.g., chest, back)     denies 3. ONSET: When did the pain begin? (e.g., minutes, hours or days ago)      X 1 day 4. SUDDEN: Gradual or sudden onset?     sudden 5. PATTERN Does the pain come and go, or is it constant?     If I lay down and don't move, I don't feel it.... but any movement is terrible 6. SEVERITY: How bad is the pain?  (e.g., Scale 1-10; mild, moderate, or severe)     8-9/10 Reports taking Tylenol  without relief 7. RECURRENT SYMPTOM: Have you ever had this type of stomach pain before? If Yes, ask: When was the last time? and What happened that time?      First time 8. CAUSE: What do you think is causing the stomach pain? (e.g., gallstones, recent abdominal surgery)     denies 9. RELIEVING/AGGRAVATING FACTORS: What makes it better or worse? (e.g., antacids, bending or twisting motion, bowel movement)     Laying down makes better 10. OTHER SYMPTOMS: Do you have any other symptoms? (e.g., back pain,  diarrhea, fever, urination pain, vomiting)       denies 11. PREGNANCY: Is there any chance you are pregnant? When was your last menstrual period?       N/a  Protocols used: Abdominal Pain - Female-A-AH

## 2023-08-03 NOTE — Patient Instructions (Signed)
 Your symptoms today suggesting muscle strain in the mid back on the left  Recommendations: You can use over-the-counter pain medicine or you can use the Percocet pain medicine that I prescribed today for the worst pain.  Caution with sedation Taking low-dose muscle relaxer you had leftover at home once or twice daily for the next day or 2 for spasm of the muscle Use some gentle stretching and range of motion activity of the next few days If worse in the next 48 hours particular with urinary changes, nausea, fever, pinkish urine or blood in the urine then recheck right away  Cyst of skin I will refer you to a specialist for excision In the meantime if redness, worse pain or swelling of the skin area then start the antibiotic Bactrim  and get reevaluated

## 2023-08-03 NOTE — Telephone Encounter (Signed)
 Copied from CRM [id number]. Topic: Clinical - Medication Question >> Aug 03, 2023 12:38 PM Myrick T wrote: Reason for CRM: patient called stated she was told by Dr Bulah that he would call in a muscle relaxer today but the pharmacy didn't have the script. Please f/u with patent   I called Pt and stated that she had told you she had muscle relaxer, however when she got home she realized she only had one and would like to have some called in.

## 2023-08-04 ENCOUNTER — Ambulatory Visit (INDEPENDENT_AMBULATORY_CARE_PROVIDER_SITE_OTHER): Admitting: Orthopaedic Surgery

## 2023-08-04 DIAGNOSIS — M7711 Lateral epicondylitis, right elbow: Secondary | ICD-10-CM

## 2023-08-04 DIAGNOSIS — M79631 Pain in right forearm: Secondary | ICD-10-CM | POA: Diagnosis not present

## 2023-08-04 NOTE — Progress Notes (Signed)
 Office Visit Note   Patient: Ariel Sweeney           Date of Birth: Sep 21, 1965           MRN: [id number] Visit Date: 08/04/2023              Requested by: Bulah Alm RAMAN, PA-C [address] [place],  [place] [address] PCP: Bulah Alm RAMAN, PA-C   Assessment & Plan: Visit Diagnoses:  1. Lateral epicondylitis, right elbow     Plan: History of Present Illness Ariel Sweeney is a 58 year old female who presents with forearm pain. She was referred by her primary care physician for evaluation of arm pain.  Initially, she experienced pain just above the elbow, noted during her annual physical. A difference between her two arms was observed, prompting a referral to orthopedics. A week later, she developed severe forearm pain, described as feeling like a bad bruise, significantly impairing her ability to lift objects. Since making the appointment three weeks ago, the pain has improved but she still experiences discomfort, particularly in the forearm. The pain feels different from her previous shoulder pain related to a rotator cuff issue.  Physical Exam MUSCULOSKELETAL: Pain in the elbow on resisted extension of middle finger and wrist extension.  Normal elbow ROM.  Ligaments stable.    Assessment and Plan Lateral epicondylitis (tennis elbow) Chronic forearm pain consistent with lateral epicondylitis, likely due to repetitive overuse. Symptoms improved but persistent. Clinical presentation aligns with tennis elbow. - Provide exercises for lateral epicondylitis. - Advise rest and avoidance of lifting heavy objects. - Consider use of a counterforce brace for the elbow. - Discuss option of physical therapy if exercises are insufficient. - Consider cortisone injection if pain worsens. - Discuss surgery as a last resort if conservative measures fail.  Follow-Up Instructions: No follow-ups on file.   Orders:  No orders of the defined types were placed in this encounter.  No orders  of the defined types were placed in this encounter.     Procedures: No procedures performed   Clinical Data: No additional findings.   Subjective: Chief Complaint  Patient presents with   Right Shoulder - Pain   Right Forearm - Pain    HPI  Review of Systems  Constitutional: Negative.   HENT: Negative.    Eyes: Negative.   Respiratory: Negative.    Cardiovascular: Negative.   Endocrine: Negative.   Musculoskeletal: Negative.   Neurological: Negative.   Hematological: Negative.   Psychiatric/Behavioral: Negative.    All other systems reviewed and are negative.    Objective: Vital Signs: There were no vitals taken for this visit.  Physical Exam Vitals and nursing note reviewed.  Constitutional:      Appearance: She is well-developed.  HENT:     Head: Atraumatic.     Nose: Nose normal.  Eyes:     Extraocular Movements: Extraocular movements intact.  Cardiovascular:     Pulses: Normal pulses.  Pulmonary:     Effort: Pulmonary effort is normal.  Abdominal:     Palpations: Abdomen is soft.  Musculoskeletal:     Cervical back: Neck supple.  Skin:    General: Skin is warm.     Capillary Refill: Capillary refill takes less than 2 seconds.  Neurological:     Mental Status: She is alert. Mental status is at baseline.  Psychiatric:        Behavior: Behavior normal.        Thought Content:  Thought content normal.        Judgment: Judgment normal.     Ortho Exam  Specialty Comments:  No specialty comments available.  Imaging: No results found.   PMFS History: Patient Active Problem List   Diagnosis Date Noted   Former smoker 05/28/2023   Lower abdominal pain 05/28/2023   CKD stage 3a, GFR 45-59 ml/min (HCC) 05/28/2023   Diabetes mellitus type 2 with complications (HCC) 02/05/2022   Screening for lung cancer 06/13/2021   Need for pneumococcal vaccination 05/30/2020   Hyperlipidemia 09/21/2019   Snoring 09/08/2019   Sleep disturbance 09/08/2019    Ill-fitting dentures 09/08/2019   Vaccine counseling 09/22/2017   Influenza vaccination declined 09/22/2017   Vitamin D  deficiency 07/09/2016   Screening for cervical cancer 07/09/2016   Decreased pedal pulses 06/23/2016   Encounter for health maintenance examination in adult 06/06/2015   Screening for breast cancer 06/06/2015   Routine general medical examination at a health care facility 06/06/2015   Essential hypertension 04/17/2014   Goiter 04/17/2014   Obesity 04/17/2014   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   Past Medical History:  Diagnosis Date   Decreased pulse    normal ABIs 06/2016   Diabetes mellitus without complication (HCC)    Full dentures 08/2018   GERD (gastroesophageal reflux disease)    intermittent   Goiter    Hyperlipidemia    Hypertension    Obesity    Tobacco use    Vitamin D  deficiency    Wears contact lenses     Family History  Problem Relation Age of Onset   Diabetes Mother    Hypertension Mother    Kidney disease Mother    Anemia Mother    Hypertension Father    Diabetes Father    Heart disease Father    Hypertension Sister    Stroke Maternal Aunt    Cancer Neg Hx    Colon cancer Neg Hx    Colon polyps Neg Hx    Esophageal cancer Neg Hx    Rectal cancer Neg Hx    Stomach cancer Neg Hx     Past Surgical History:  Procedure Laterality Date   BREAST CYST ASPIRATION Right 2017   cyst    CHOLECYSTECTOMY     COLONOSCOPY  09/2015   tubular adenoma, repeat 5 years;  Dr. Gustav Mcgee   COLPOSCOPY     history of abnormal pap in remote past   ROTATOR CUFF REPAIR Right    12/24   STRABISMUS SURGERY     childhood   Social History   Occupational History   Not on file  Tobacco Use   Smoking status: Former    Current packs/day: 0.25    Average packs/day: 0.3 packs/day for 27.0 years (6.8 ttl pk-yrs)    Types: Cigarettes   Smokeless tobacco: Never  Vaping Use   Vaping status: Never Used  Substance and Sexual Activity   Alcohol use: Yes     Alcohol/week: 3.0 standard drinks of alcohol    Types: 1 Glasses of wine, 1 Cans of beer, 1 Shots of liquor per week    Comment: 4 weekly   Drug use: No   Sexual activity: Not on file

## 2023-08-05 ENCOUNTER — Telehealth: Payer: Self-pay | Admitting: Medical

## 2023-08-05 NOTE — Telephone Encounter (Signed)
 Copied from CRM #[id number]. Topic: General - Other >> Aug 05, 2023  1:53 PM Wess RAMAN wrote: Reason for CRM: Patient would like to let Tysinger, Alm, PA-C know that the pain has moved to the right side of her body. He stated that they may have to do a CT Scan if the pain has worsened  Callback #: [phone]

## 2023-08-06 ENCOUNTER — Other Ambulatory Visit: Payer: Self-pay | Admitting: Medical

## 2023-08-06 ENCOUNTER — Other Ambulatory Visit

## 2023-08-06 DIAGNOSIS — M549 Dorsalgia, unspecified: Secondary | ICD-10-CM

## 2023-08-06 DIAGNOSIS — E118 Type 2 diabetes mellitus with unspecified complications: Secondary | ICD-10-CM

## 2023-08-06 DIAGNOSIS — R109 Unspecified abdominal pain: Secondary | ICD-10-CM

## 2023-08-06 NOTE — Progress Notes (Signed)
 08/06/2023 Name: Ariel Sweeney MRN: [id number] DOB: 10-14-1965  Chief Complaint  Patient presents with   Medication Management   Diabetes    Ariel Sweeney is a 58 y.o. year old female who presented for a telephone visit.   They were referred to the pharmacist by a quality report for assistance in managing diabetes.    Subjective:  Care Team: Primary Care Provider: Bulah Alm Sweeney Sweeney ; Next Scheduled Visit: 09/15/23  Medication Access/Adherence  Current Pharmacy:  [facility] [address], [place] - [address] [place] [address] Phone: [phone] Fax: [phone]  [id number] [place], [place] - [address] AT [facility] & [address] [place] [address] Phone: [phone] Fax: [phone]  [place] [facility] [address] [place] [address] Phone: [phone] Fax: [phone]   Patient reports affordability concerns with their medications: Yes  - Mounjaro  Patient reports access/transportation concerns to their pharmacy: No  Patient reports adherence concerns with their medications:  No     Diabetes:  Current medications: Mounjaro  7.5mg  once weekly on Sundays (just finished 5mg , starts this dose on Sunday 7/27/) Medications tried in the past: Trulicity   Current glucose readings: Not checking at home, reports she does have meter and supplies at this time  Denies any signs/symptoms of low/high BG   Current medication access support: Getting mounjaro  with copay card   Objective:  Lab Results  Component Value Date   HGBA1C 8.6 (H) 05/28/2023    Lab Results  Component Value Date   CREATININE 1.30 (H) 05/28/2023   BUN 14 05/28/2023   NA 138 05/28/2023   K 3.5 05/28/2023   CL 100 05/28/2023   CO2 20 05/28/2023    Lab Results  Component Value Date   CHOL 139 07/13/2023   HDL 47 07/13/2023    LDLCALC 54 07/13/2023   TRIG 241 (H) 07/13/2023   CHOLHDL 3.0 07/13/2023    Medications Reviewed Today     Reviewed by Ariel Sweeney, RPH (Pharmacist) on 08/06/23 at 1343  Med List Status: <None>   Medication Order Taking? Sig Documenting Provider Last Dose Status Informant  Accu-Chek Softclix Lancets lancets [id number]  Use to check blood sugar once daily. Dx: E11.8 May substitute to any brand preferred by insurance Tysinger, Alm GORMAN, PA-Sweeney  Active   amLODipine  (NORVASC ) 10 MG tablet [id number] Yes Take 1 tablet (10 mg total) by mouth daily. Tysinger, Alm GORMAN, PA-Sweeney  Active   aspirin  EC (EQ ASPIRIN  ADULT LOW DOSE) 81 MG tablet [id number] Yes Take 1 tablet (81 mg total) by mouth daily. Swallow whole. Tysinger, Alm GORMAN, PA-Sweeney  Active   Blood Glucose Monitoring Suppl (ACCU-CHEK GUIDE ME) w/Device KIT [id number]  Use to check blood sugar once daily. Dx: E11.8 May substitute to any brand preferred by insurance Tysinger, Alm GORMAN, PA-Sweeney  Active   fenofibrate  micronized (LOFIBRA) 134 MG capsule [id number] Yes Take 1 capsule (134 mg total) by mouth daily before breakfast. Tysinger, Alm GORMAN, PA-Sweeney  Active   glucose blood (ACCU-CHEK GUIDE TEST) test strip [id number]  Use to check blood sugar once daily. Dx: E11.8 May substitute to any brand preferred by insurance Tysinger, Alm GORMAN, PA-Sweeney  Active   methocarbamol  (ROBAXIN -750) 750 MG tablet [id number]  Take 1 tablet (750 mg total) by mouth 2 (two) times daily as needed for muscle spasms. Tysinger, Alm GORMAN, PA-Sweeney  Active  oxyCODONE -acetaminophen  (PERCOCET) 5-325 MG tablet [id number]  Take 1 tablet by mouth every 4 (four) hours as needed for severe pain (pain score 7-10). Tysinger, Alm RAMAN, PA-Sweeney  Active   rosuvastatin  (CRESTOR ) 10 MG tablet [id number] Yes Take 1 tablet by mouth once daily Lalonde, John C, MD  Active   sulfamethoxazole -trimethoprim  (BACTRIM  DS) 800-160 MG tablet [id number]  Take 1 tablet by mouth 2 (two) times daily. Tysinger, Alm RAMAN, PA-Sweeney  Active    tirzepatide  (MOUNJARO ) 10 MG/0.5ML Pen [id number]  Inject 10 mg into the skin once a week. Ariel Alm RAMAN, PA-Sweeney  Active     Discontinued 08/06/23 1343 (Dose change)   tirzepatide  (MOUNJARO ) 7.5 MG/0.5ML Pen [id number] Yes Inject 7.5 mg into the skin once a week. Tysinger, Alm RAMAN, PA-Sweeney  Active   valsartan -hydrochlorothiazide  (DIOVAN -HCT) 160-12.5 MG tablet [id number] Yes Take 1 tablet by mouth daily. Tysinger, Alm RAMAN, PA-Sweeney  Active               Assessment/Plan:   Diabetes: - Currently uncontrolled - Reviewed long term cardiovascular and renal outcomes of uncontrolled blood sugar - Reviewed goal A1c, goal fasting, and goal 2 hour post prandial glucose - Recommend to check sugar once daily, sending in order for supplies  - Patient denies personal or family history of multiple endocrine neoplasia type 2, medullary thyroid  cancer; personal history of pancreatitis or gallbladder disease. -Patient has a $3500 deductible with her insurance. Company savings card made medication $25 last month but has a max annual allowance of $1950, which is why price increased for this month. Patient confirms she can afford to stay on medication at this time. -If price continues to rise, may need to consider converting to ozempic  which has its own copay card   Follow Up Plan: see PCP in 6 weeks, will follow up after as indicated  Ariel Sweeney, PharmD Clinical Pharmacist [phone]

## 2023-08-18 DIAGNOSIS — L728 Other follicular cysts of the skin and subcutaneous tissue: Secondary | ICD-10-CM | POA: Diagnosis not present

## 2023-08-18 DIAGNOSIS — L910 Hypertrophic scar: Secondary | ICD-10-CM | POA: Diagnosis not present

## 2023-08-19 ENCOUNTER — Telehealth: Payer: Self-pay | Admitting: Pharmacy Technician

## 2023-08-19 ENCOUNTER — Telehealth: Payer: Self-pay | Admitting: Internal Medicine

## 2023-08-19 NOTE — Progress Notes (Signed)
   08/19/2023  Patient ID: Ariel Sweeney, female   DOB: December 03, 1965, 58 y.o.   MRN: [id number]  Patient engaged with clinical pharmacist for management of diabetes on 08/06/2023. Outreach by [employer] technician was requested.   Outreached patient to discuss diabetes medication management. Left voicemail for patient to return my call at their convenience.   Loeta Herst, CPhT [facility] Office: [phone] Email: [email or web address]

## 2023-08-19 NOTE — Telephone Encounter (Signed)
 Lab Results  Component Value Date   HGBA1C 8.6 (H) 05/28/2023   HGBA1C 6.7 (A) 10/16/2022   HGBA1C 6.3 (H) 03/10/2022   A1c was done on 05/28/23. Pt has another appt in September for a follow-up on diabetes

## 2023-08-21 ENCOUNTER — Telehealth: Payer: Self-pay | Admitting: Pharmacy Technician

## 2023-08-21 NOTE — Progress Notes (Addendum)
   08/21/2023 Name: Ariel Sweeney MRN: [id number] DOB: 1966/01/02  Patient is appearing on a report for [facility] [place] Metric Diabetes and last engaged with the clinical pharmacist to discuss diabetes on 08/06/2023. Contacted patient today to discuss diabetes management and completed medication review.   Diabetes Plan from last clinical pharmacist appointment:  Diabetes: - Currently uncontrolled - Reviewed long term cardiovascular and renal outcomes of uncontrolled blood sugar - Reviewed goal A1c, goal fasting, and goal 2 hour post prandial glucose - Recommend to check sugar once daily, sending in order for supplies  - Patient denies personal or family history of multiple endocrine neoplasia type 2, medullary thyroid  cancer; personal history of pancreatitis or gallbladder disease. -Patient has a $3500 deductible with her insurance. Company savings card made medication $25 last month but has a max annual allowance of $1950, which is why price increased for this month. Patient confirms she can afford to stay on medication at this time. -If price continues to rise, may need to consider converting to ozempic  which has its own copay card(copy/paste from last note)   Medication Adherence Barriers Identified:  Patient made recommended medication changes per plan: Yes Patient informs she made the dose increase up to 7.5mg  of Mounjaro  weekly. She informs she tolerated the dose increase well. Access issues with any new medication or testing device: No She informs the price of the Mounjaro  did increase significantly. She informs she paid $275 for a 3 month supply. She informs it seems as though the price increases as the dose increases. Per Dr Annemarie, Mounjaro  was last filled for 3 month supply on 08/05/2023.  Patient is checking blood sugars as prescribed: No Patient does not like the feel of pricking her finger to test her blood sugars. She has not checked it in a few weeks. She has the supplies to check it. She  informs she has not had any symptoms of hyperglycemia or hypoglycemia.    Medication Adherence Barriers Addressed/Actions Taken:  Reviewed medication changes per plan from last clinical pharmacist note  Educated patient to contact pharmacy regarding new prescriptions and refills  Reviewed instructions for monitoring blood sugars at home and reminded patient to keep a written log to review with pharmacist Reminded patient of date/time of upcoming clinical pharmacist follow up and any upcoming PCP/specialists visits. Patient denies transportation barriers to the appointment. Yes  Next clinical pharmacist appointment is scheduled for: TBD  Kate Caddy, CPhT [facility] Office: [phone] Email: [email or web address]

## 2023-09-11 DIAGNOSIS — L728 Other follicular cysts of the skin and subcutaneous tissue: Secondary | ICD-10-CM | POA: Diagnosis not present

## 2023-09-15 ENCOUNTER — Ambulatory Visit: Admitting: Medical

## 2023-10-06 ENCOUNTER — Other Ambulatory Visit: Payer: Self-pay | Admitting: Medical

## 2023-10-06 ENCOUNTER — Other Ambulatory Visit: Payer: Self-pay | Admitting: Family Medicine

## 2023-10-06 NOTE — Telephone Encounter (Signed)
 Dose was increased.

## 2023-10-09 ENCOUNTER — Telehealth: Payer: Self-pay | Admitting: Medical

## 2023-10-09 NOTE — Telephone Encounter (Signed)
 Patient was identified as falling into the [place] Measure - Diabetes.   Patient was: Appointment scheduled for lab or office visit for A1c. 10/13/23

## 2023-10-13 ENCOUNTER — Ambulatory Visit (INDEPENDENT_AMBULATORY_CARE_PROVIDER_SITE_OTHER): Admitting: Medical

## 2023-10-13 VITALS — BP 122/72 | HR 66 | Wt 178.6 lb

## 2023-10-13 DIAGNOSIS — E118 Type 2 diabetes mellitus with unspecified complications: Secondary | ICD-10-CM

## 2023-10-13 DIAGNOSIS — R1031 Right lower quadrant pain: Secondary | ICD-10-CM

## 2023-10-13 DIAGNOSIS — I10 Essential (primary) hypertension: Secondary | ICD-10-CM | POA: Diagnosis not present

## 2023-10-13 DIAGNOSIS — R10A0 Flank pain, unspecified side: Secondary | ICD-10-CM

## 2023-10-13 DIAGNOSIS — E785 Hyperlipidemia, unspecified: Secondary | ICD-10-CM

## 2023-10-13 DIAGNOSIS — N1831 Chronic kidney disease, stage 3a: Secondary | ICD-10-CM

## 2023-10-13 DIAGNOSIS — R109 Unspecified abdominal pain: Secondary | ICD-10-CM

## 2023-10-13 NOTE — Progress Notes (Signed)
 Name: Ariel Sweeney   Date of Visit: 10/13/23   Date of last visit with me: 10/09/2023   CHIEF COMPLAINT:  Chief Complaint  Patient presents with   Medical Management of Chronic Issues    Diabetes check. No concerns, declines flu and covid shot today       HPI:  Discussed the use of AI scribe software for clinical note transcription with the patient, who gave verbal consent to proceed.  History of Present Illness   [name] is a 58 year old female with hypertension, hyperlipidemia, diabetes, and chronic kidney disease stage 3A who presents for a medication check.  hypertension She is currently on valsartan  HCT 160/12.5 mg daily and amlodipine  10 mg daily for hypertension.  Her blood pressure readings at home are infrequent  Hyperlipidemia  Currently taking Crestor  10 mg daily and  fenofibrate  134 mg daily for hyperlipidemia  Diabetes - currently on Mounjaro  7.5mg  weekly.  On Mounjaro , she has experienced weight loss from 194 pounds in June to 178 pounds currently.  her blood sugar checks at work show readings in the mid-eighties.   Her last blood work in June showed normal panels except for kidney markers. Her A1c in May was 8.6%. Triglycerides were high, and she is currently on fenofibrate . A urine test in July showed a little protein.  Her GFR has been running about 48 and her creatinine level about 1.3. She has not had any kidney imaging or seen a kidney specialist.  She experiences right flank/abdominal pain that is persistent but not severe. The pain is worse in the morning and does not seem to be related to movement or eating. She initially thought it might be a bladder infection, but there is no burning during urination or increased frequency.  No other aggravating or relieving factors. No other complaint.   Past Medical History:  Diagnosis Date   Decreased pulse    normal ABIs 06/2016   Diabetes mellitus without complication (HCC)    Full dentures  08/2018   GERD (gastroesophageal reflux disease)    intermittent   Goiter    Hyperlipidemia    Hypertension    Obesity    Tobacco use    Vitamin D  deficiency    Wears contact lenses    Current Outpatient Medications on File Prior to Visit  Medication Sig Dispense Refill   amLODipine  (NORVASC ) 10 MG tablet Take 1 tablet (10 mg total) by mouth daily. 90 tablet 3   aspirin  EC (EQ ASPIRIN  ADULT LOW DOSE) 81 MG tablet Take 1 tablet (81 mg total) by mouth daily. Swallow whole. 90 tablet 3   fenofibrate  micronized (LOFIBRA) 134 MG capsule Take 1 capsule (134 mg total) by mouth daily before breakfast. 90 capsule 1   rosuvastatin  (CRESTOR ) 10 MG tablet Take 1 tablet by mouth once daily 90 tablet 0   tirzepatide  (MOUNJARO ) 10 MG/0.5ML Pen Inject 10 mg into the skin once a week. 6 mL 0   valsartan -hydrochlorothiazide  (DIOVAN -HCT) 160-12.5 MG tablet Take 1 tablet by mouth daily. 90 tablet 1   Accu-Chek Softclix Lancets lancets Use to check blood sugar once daily. Dx: E11.8 May substitute to any brand preferred by insurance 100 each 3   Blood Glucose Monitoring Suppl (ACCU-CHEK GUIDE ME) w/Device KIT Use to check blood sugar once daily. Dx: E11.8 May substitute to any brand preferred by insurance 1 kit 0   glucose blood (ACCU-CHEK GUIDE TEST) test strip Use to check blood sugar once daily.  Dx: E11.8 May substitute to any brand preferred by insurance 100 each 3   tirzepatide  (MOUNJARO ) 7.5 MG/0.5ML Pen Inject 7.5 mg into the skin once a week. 6 mL 0   No current facility-administered medications on file prior to visit.   ROS as in subjective   Objective: BP 122/72   Pulse 66   Wt 178 lb 9.6 oz (81 kg)   BMI 32.67 kg/m   Gen:wd, wn, nad Abdomen: +bs, soft, nontender, no mass, no organomegaly Back nontender with normal ROM Hips and legs nontender with normal ROM Legs neurovascularly  intact No ext edema No reproducible pain on exam    Assessment and Plan Encounter Diagnoses  Name  Primary?   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 Differential includes musculoskeletal, gastrointestinal, or renal causes. Previous proteinuria noted without current urinary symptoms. - Order abdominal and pelvic ultrasound.  Type 2 diabetes mellitus Improved glucose control with recent readings in mid-80s. Last A1c was 8.6%, expected to be lower now. - Order hemoglobin A1c test. -continue Mounjaro  7.5mg  weekly -continue glucose monitoring  Chronic kidney disease stage 3A GFR 48. Discussed kidney protection strategies and potential nephrology referral. Emphasized hydration and NSAID avoidance. - Encourage hydration with 80-100 ounces of water daily. - Avoid NSAIDs. - Hold valsartan  if significant dehydration occurs. - Order microalbumin test. - Consider nephrology referral vs possibly adding SGL2 or Kerendia for prevention.  Hypertension Well-controlled with valsartan  HCT 160/12.5mg  daily and amlodipine  10mg  daily. Home BP readings stable.  Hyperlipidemia Managed with Crestor  10mg  daily and fenofibrate  134mg  daily, Aspirin  81mg  daily. Previous high triglycerides. - Order lipid panel.  Declines vaccines today   Ariel Sweeney was seen today for medical management of chronic issues.  Diagnoses and all orders for this visit:  Diabetes mellitus type 2 with complications (HCC) -     Hemoglobin A1c -     Microalbumin/Creatinine Ratio, Urine  Essential hypertension -     Cancel: Urinalysis, Routine w reflex microscopic -     Comprehensive metabolic panel with GFR  CKD stage 3a, GFR 45-59 ml/min (HCC) -     Comprehensive metabolic panel with GFR  Hyperlipidemia, unspecified hyperlipidemia type -     Lipid  panel  Flank pain -     US  Pelvis Limited; Future -     US  Abdomen Complete; Future  Right lower quadrant abdominal pain -     US  Pelvis Limited; Future -     US  Abdomen Complete; Future     F/u pending labs

## 2023-10-13 NOTE — Addendum Note (Signed)
 Addended by: VICCI HUSBAND A on: 10/13/2023 02:28 PM   Modules accepted: Orders

## 2023-10-14 ENCOUNTER — Ambulatory Visit: Payer: Self-pay | Admitting: Medical

## 2023-10-14 ENCOUNTER — Telehealth: Payer: Self-pay | Admitting: Internal Medicine

## 2023-10-14 ENCOUNTER — Other Ambulatory Visit

## 2023-10-14 DIAGNOSIS — N1831 Chronic kidney disease, stage 3a: Secondary | ICD-10-CM

## 2023-10-14 DIAGNOSIS — E118 Type 2 diabetes mellitus with unspecified complications: Secondary | ICD-10-CM | POA: Diagnosis not present

## 2023-10-14 DIAGNOSIS — E785 Hyperlipidemia, unspecified: Secondary | ICD-10-CM | POA: Diagnosis not present

## 2023-10-14 DIAGNOSIS — I10 Essential (primary) hypertension: Secondary | ICD-10-CM

## 2023-10-14 LAB — MICROALBUMIN / CREATININE URINE RATIO
Creatinine, Urine: 68.5 mg/dL
Microalb/Creat Ratio: 170 mg/g{creat} — ABNORMAL HIGH (ref 0–29)
Microalbumin, Urine: 116.7 ug/mL

## 2023-10-14 LAB — LIPID PANEL

## 2023-10-14 NOTE — Telephone Encounter (Signed)
 Called and left detailed message on Kay vm that without transvaginal is fine but if limited then we can switch to with transvaginal

## 2023-10-14 NOTE — Telephone Encounter (Signed)
 Copied from CRM #[id number]. Topic: General - Other >> Oct 14, 2023 10:32 AM Debby BROCKS wrote: Reason for CRM: Shawnee from [facility] is calling for a question regarding ultrasound pelvis, right lower quadrant abdonminal pain. They want to know if its with or without transvaginal  [phone] Ext: [phone] Choose option 1 into option 5 for colleagues if you cannot get through to [facility]

## 2023-10-15 ENCOUNTER — Telehealth: Payer: Self-pay | Admitting: *Deleted

## 2023-10-15 ENCOUNTER — Other Ambulatory Visit: Payer: Self-pay | Admitting: Medical

## 2023-10-15 ENCOUNTER — Encounter: Payer: Self-pay | Admitting: Medical

## 2023-10-15 DIAGNOSIS — R7989 Other specified abnormal findings of blood chemistry: Secondary | ICD-10-CM

## 2023-10-15 DIAGNOSIS — R748 Abnormal levels of other serum enzymes: Secondary | ICD-10-CM

## 2023-10-15 DIAGNOSIS — E876 Hypokalemia: Secondary | ICD-10-CM

## 2023-10-15 LAB — COMPREHENSIVE METABOLIC PANEL WITH GFR
ALT: 20 IU/L (ref 0–32)
AST: 19 IU/L (ref 0–40)
Albumin: 4.6 g/dL (ref 3.8–4.9)
Alkaline Phosphatase: 46 IU/L — AB (ref 49–135)
BUN/Creatinine Ratio: 12 (ref 9–23)
BUN: 18 mg/dL (ref 6–24)
Bilirubin Total: 0.3 mg/dL (ref 0.0–1.2)
CO2: 22 mmol/L (ref 20–29)
Calcium: 9.6 mg/dL (ref 8.7–10.2)
Chloride: 101 mmol/L (ref 96–106)
Creatinine, Ser: 1.47 mg/dL — AB (ref 0.57–1.00)
Globulin, Total: 2.3 g/dL (ref 1.5–4.5)
Glucose: 113 mg/dL — AB (ref 70–99)
Potassium: 3.3 mmol/L — ABNORMAL LOW (ref 3.5–5.2)
Sodium: 140 mmol/L (ref 134–144)
Total Protein: 6.9 g/dL (ref 6.0–8.5)
eGFR: 41 mL/min/1.73 — AB (ref >59)

## 2023-10-15 LAB — LIPID PANEL
Cholesterol, Total: 121 mg/dL (ref 100–199)
HDL: 47 mg/dL (ref >39)
LDL CALC COMMENT:: 2.6 ratio (ref 0.0–4.4)
LDL Chol Calc (NIH): 49 mg/dL (ref 0–99)
Triglycerides: 144 mg/dL (ref 0–149)
VLDL Cholesterol Cal: 25 mg/dL (ref 5–40)

## 2023-10-15 LAB — HEMOGLOBIN A1C
Est. average glucose Bld gHb Est-mCnc: 126 mg/dL
Hgb A1c MFr Bld: 6 % — ABNORMAL HIGH (ref 4.8–5.6)

## 2023-10-15 MED ORDER — VALSARTAN 160 MG PO TABS: 160.0000 mg | ORAL_TABLET | Freq: Every day | ORAL | 2 refills | Status: DC

## 2023-10-15 MED ORDER — ASPIRIN 81 MG PO TBEC: 81.0000 mg | DELAYED_RELEASE_TABLET | Freq: Every day | ORAL | 3 refills | Status: AC

## 2023-10-15 MED ORDER — MAGNESIUM 400 MG PO TABS: 1.0000 | ORAL_TABLET | Freq: Every day | ORAL | 2 refills | Status: AC

## 2023-10-15 MED ORDER — AMLODIPINE BESYLATE 10 MG PO TABS: 10.0000 mg | ORAL_TABLET | Freq: Every day | ORAL | 3 refills | Status: AC

## 2023-10-15 NOTE — Progress Notes (Signed)
 Results through MyChart

## 2023-10-15 NOTE — Telephone Encounter (Signed)
 Called and spoke with Shawnee and gave her message.

## 2023-10-15 NOTE — Telephone Encounter (Signed)
 Copied from CRM #[id number]. Topic: Clinical - Request for Lab/Test Order >> Oct 15, 2023 10:52 AM Darshell M wrote: Reason for CRM: Shawnee [id number], [phone] with [facility] to clarify reason for the ultrasound abdomen complete. Need to know what you are trying to access for. The ultrasound abdomen complete only shows the upper quadrant and not the lower quadrant as requested.

## 2023-10-19 ENCOUNTER — Ambulatory Visit
Admission: RE | Admit: 2023-10-19 | Discharge: 2023-10-19 | Disposition: A | Source: Ambulatory Visit | Attending: Medical | Admitting: Medical

## 2023-10-19 DIAGNOSIS — R109 Unspecified abdominal pain: Secondary | ICD-10-CM | POA: Diagnosis not present

## 2023-10-19 DIAGNOSIS — D259 Leiomyoma of uterus, unspecified: Secondary | ICD-10-CM | POA: Diagnosis not present

## 2023-10-19 DIAGNOSIS — N9489 Other specified conditions associated with female genital organs and menstrual cycle: Secondary | ICD-10-CM | POA: Diagnosis not present

## 2023-10-19 DIAGNOSIS — R10A0 Flank pain, unspecified side: Secondary | ICD-10-CM

## 2023-10-19 DIAGNOSIS — R1031 Right lower quadrant pain: Secondary | ICD-10-CM

## 2023-10-25 NOTE — Progress Notes (Signed)
 Results thru my chart

## 2023-11-05 NOTE — Progress Notes (Signed)
 Ariel Sweeney                                          MRN: [id number]   11/05/2023   The [facility] Quality Team Specialist reviewed this patient medical record for the purposes of chart review for care gap closure. The following were reviewed: abstraction for care gap closure-glycemic status assessment.    [facility] Quality Team

## 2023-11-13 ENCOUNTER — Telehealth: Payer: Self-pay | Admitting: Internal Medicine

## 2023-11-13 NOTE — Telephone Encounter (Signed)
 FYI   Copied from CRM [id number]. Topic: General - Other >> Nov 13, 2023  2:30 PM Kevelyn M wrote: Reason for CRM: Optometrist office calling to provider know that patient is scheduled for December to see them

## 2023-11-15 ENCOUNTER — Other Ambulatory Visit: Payer: Self-pay

## 2023-11-15 DIAGNOSIS — E118 Type 2 diabetes mellitus with unspecified complications: Secondary | ICD-10-CM

## 2023-11-15 NOTE — Progress Notes (Signed)
   11/15/2023  Patient ID: Ariel Sweeney, female   DOB: 08/17/1965, 58 y.o.   MRN: [id number]  Pharmacy Quality Measure Review  This patient is appearing on a report for being at risk of failing the Glycemic Status Assessment in Diabetes measure this calendar year.   Last documented A1c 6.0 on 10/14/23  Report was not up to date. No further action needed at this time.  Jon VEAR Lindau, PharmD Clinical Pharmacist [phone]

## 2023-11-16 ENCOUNTER — Encounter: Payer: Self-pay | Admitting: Radiology

## 2023-11-30 DIAGNOSIS — B9689 Other specified bacterial agents as the cause of diseases classified elsewhere: Secondary | ICD-10-CM | POA: Diagnosis not present

## 2023-11-30 DIAGNOSIS — R0981 Nasal congestion: Secondary | ICD-10-CM | POA: Diagnosis not present

## 2023-11-30 DIAGNOSIS — J069 Acute upper respiratory infection, unspecified: Secondary | ICD-10-CM | POA: Diagnosis not present

## 2023-12-14 DIAGNOSIS — E119 Type 2 diabetes mellitus without complications: Secondary | ICD-10-CM | POA: Diagnosis not present

## 2023-12-14 DIAGNOSIS — H35033 Hypertensive retinopathy, bilateral: Secondary | ICD-10-CM | POA: Diagnosis not present

## 2023-12-14 LAB — OPHTHALMOLOGY REPORT-SCANNED

## 2023-12-21 ENCOUNTER — Encounter

## 2023-12-21 ENCOUNTER — Other Ambulatory Visit

## 2024-01-04 ENCOUNTER — Other Ambulatory Visit: Payer: Self-pay | Admitting: Medical

## 2024-01-04 ENCOUNTER — Other Ambulatory Visit: Payer: Self-pay | Admitting: Family Medicine

## 2024-01-04 NOTE — Telephone Encounter (Signed)
 Switched to plain valsartan 

## 2024-01-05 ENCOUNTER — Other Ambulatory Visit

## 2024-01-05 ENCOUNTER — Encounter

## 2024-01-10 ENCOUNTER — Other Ambulatory Visit: Payer: Self-pay | Admitting: Medical

## 2024-01-12 ENCOUNTER — Other Ambulatory Visit: Payer: Self-pay | Admitting: Medical

## 2024-01-20 ENCOUNTER — Encounter

## 2024-01-20 ENCOUNTER — Other Ambulatory Visit

## 2024-01-21 ENCOUNTER — Other Ambulatory Visit: Payer: Self-pay | Admitting: Medical
